# Patient Record
Sex: Male | Born: 1935 | Race: White | Hispanic: No | Marital: Single | Smoking: Current every day smoker
Health system: Southern US, Community
[De-identification: ages and names within clinical notes are randomized; demographics above are authoritative.]

## PROBLEM LIST (undated history)

## (undated) DIAGNOSIS — C801 Malignant (primary) neoplasm, unspecified: Secondary | ICD-10-CM

## (undated) DIAGNOSIS — K219 Gastro-esophageal reflux disease without esophagitis: Secondary | ICD-10-CM

## (undated) DIAGNOSIS — T1490XA Injury, unspecified, initial encounter: Secondary | ICD-10-CM

## (undated) DIAGNOSIS — Z5189 Encounter for other specified aftercare: Secondary | ICD-10-CM

## (undated) DIAGNOSIS — K8689 Other specified diseases of pancreas: Secondary | ICD-10-CM

## (undated) DIAGNOSIS — IMO0001 Reserved for inherently not codable concepts without codable children: Secondary | ICD-10-CM

## (undated) DIAGNOSIS — K7460 Unspecified cirrhosis of liver: Secondary | ICD-10-CM

## (undated) DIAGNOSIS — Z9221 Personal history of antineoplastic chemotherapy: Secondary | ICD-10-CM

## (undated) DIAGNOSIS — E119 Type 2 diabetes mellitus without complications: Principal | ICD-10-CM

## (undated) DIAGNOSIS — H269 Unspecified cataract: Secondary | ICD-10-CM

## (undated) DIAGNOSIS — I639 Cerebral infarction, unspecified: Secondary | ICD-10-CM

## (undated) DIAGNOSIS — F1020 Alcohol dependence, uncomplicated: Secondary | ICD-10-CM

## (undated) DIAGNOSIS — R0602 Shortness of breath: Secondary | ICD-10-CM

## (undated) DIAGNOSIS — R251 Tremor, unspecified: Secondary | ICD-10-CM

## (undated) DIAGNOSIS — N200 Calculus of kidney: Secondary | ICD-10-CM

## (undated) DIAGNOSIS — C3490 Malignant neoplasm of unspecified part of unspecified bronchus or lung: Secondary | ICD-10-CM

## (undated) HISTORY — DX: Type 2 diabetes mellitus without complications: E11.9

## (undated) HISTORY — PX: BACK SURGERY: SHX140

## (undated) HISTORY — DX: Unspecified cataract: H26.9

## (undated) HISTORY — PX: CARDIOVASCULAR STRESS TEST: SHX262

## (undated) HISTORY — PX: OTHER SURGICAL HISTORY: SHX169

## (undated) HISTORY — PX: CATARACT EXTRACTION: SUR2

## (undated) HISTORY — PX: AMPUTATION: SHX166

## (undated) HISTORY — DX: Other specified diseases of pancreas: K86.89

## (undated) HISTORY — PX: CHOLECYSTECTOMY: SHX55

---

## 1997-07-24 ENCOUNTER — Encounter: Admission: RE | Admit: 1997-07-24 | Discharge: 1997-07-24 | Payer: Self-pay | Admitting: Family Medicine

## 1997-08-07 ENCOUNTER — Encounter: Admission: RE | Admit: 1997-08-07 | Discharge: 1997-08-07 | Payer: Self-pay | Admitting: Family Medicine

## 1997-10-26 ENCOUNTER — Encounter: Admission: RE | Admit: 1997-10-26 | Discharge: 1997-10-26 | Payer: Self-pay | Admitting: Family Medicine

## 1997-11-27 ENCOUNTER — Encounter: Admission: RE | Admit: 1997-11-27 | Discharge: 1997-11-27 | Payer: Self-pay | Admitting: Family Medicine

## 1997-12-05 ENCOUNTER — Encounter: Admission: RE | Admit: 1997-12-05 | Discharge: 1997-12-05 | Payer: Self-pay | Admitting: Sports Medicine

## 1997-12-27 ENCOUNTER — Encounter: Admission: RE | Admit: 1997-12-27 | Discharge: 1997-12-27 | Payer: Self-pay | Admitting: Family Medicine

## 1998-02-01 ENCOUNTER — Encounter: Admission: RE | Admit: 1998-02-01 | Discharge: 1998-02-01 | Payer: Self-pay | Admitting: Family Medicine

## 1998-02-06 ENCOUNTER — Encounter
Admission: RE | Admit: 1998-02-06 | Discharge: 1998-05-07 | Payer: Self-pay | Admitting: Physical Medicine and Rehabilitation

## 1999-04-24 ENCOUNTER — Encounter: Admission: RE | Admit: 1999-04-24 | Discharge: 1999-04-24 | Payer: Self-pay | Admitting: Family Medicine

## 1999-05-08 ENCOUNTER — Encounter: Admission: RE | Admit: 1999-05-08 | Discharge: 1999-05-08 | Payer: Self-pay | Admitting: Family Medicine

## 1999-05-24 ENCOUNTER — Encounter: Admission: RE | Admit: 1999-05-24 | Discharge: 1999-05-24 | Payer: Self-pay | Admitting: Family Medicine

## 2000-06-10 ENCOUNTER — Encounter: Admission: RE | Admit: 2000-06-10 | Discharge: 2000-06-10 | Payer: Self-pay | Admitting: Family Medicine

## 2000-07-08 ENCOUNTER — Encounter: Admission: RE | Admit: 2000-07-08 | Discharge: 2000-07-08 | Payer: Self-pay | Admitting: Family Medicine

## 2000-08-07 ENCOUNTER — Encounter: Admission: RE | Admit: 2000-08-07 | Discharge: 2000-08-07 | Payer: Self-pay | Admitting: Family Medicine

## 2000-09-16 ENCOUNTER — Encounter: Admission: RE | Admit: 2000-09-16 | Discharge: 2000-09-16 | Payer: Self-pay | Admitting: Family Medicine

## 2000-10-23 ENCOUNTER — Encounter: Admission: RE | Admit: 2000-10-23 | Discharge: 2000-10-23 | Payer: Self-pay | Admitting: Family Medicine

## 2001-03-01 ENCOUNTER — Encounter: Admission: RE | Admit: 2001-03-01 | Discharge: 2001-03-01 | Payer: Self-pay | Admitting: Family Medicine

## 2001-04-26 ENCOUNTER — Encounter: Admission: RE | Admit: 2001-04-26 | Discharge: 2001-04-26 | Payer: Self-pay | Admitting: Sports Medicine

## 2001-05-17 ENCOUNTER — Encounter: Admission: RE | Admit: 2001-05-17 | Discharge: 2001-05-17 | Payer: Self-pay | Admitting: Family Medicine

## 2001-05-18 ENCOUNTER — Encounter: Payer: Self-pay | Admitting: Sports Medicine

## 2001-05-18 ENCOUNTER — Ambulatory Visit (HOSPITAL_COMMUNITY): Admission: RE | Admit: 2001-05-18 | Discharge: 2001-05-18 | Payer: Self-pay | Admitting: Sports Medicine

## 2001-05-25 ENCOUNTER — Encounter: Payer: Self-pay | Admitting: Anesthesiology

## 2001-05-25 ENCOUNTER — Encounter (INDEPENDENT_AMBULATORY_CARE_PROVIDER_SITE_OTHER): Payer: Self-pay | Admitting: Specialist

## 2001-05-25 ENCOUNTER — Inpatient Hospital Stay (HOSPITAL_COMMUNITY): Admission: RE | Admit: 2001-05-25 | Discharge: 2001-06-04 | Payer: Self-pay | Admitting: *Deleted

## 2001-11-26 ENCOUNTER — Encounter: Admission: RE | Admit: 2001-11-26 | Discharge: 2001-11-26 | Payer: Self-pay | Admitting: Family Medicine

## 2001-12-27 ENCOUNTER — Encounter: Admission: RE | Admit: 2001-12-27 | Discharge: 2001-12-27 | Payer: Self-pay | Admitting: Family Medicine

## 2002-07-01 ENCOUNTER — Encounter: Admission: RE | Admit: 2002-07-01 | Discharge: 2002-07-01 | Payer: Self-pay | Admitting: Family Medicine

## 2002-07-04 ENCOUNTER — Encounter: Admission: RE | Admit: 2002-07-04 | Discharge: 2002-07-04 | Payer: Self-pay | Admitting: Family Medicine

## 2002-10-05 ENCOUNTER — Emergency Department (HOSPITAL_COMMUNITY): Admission: EM | Admit: 2002-10-05 | Discharge: 2002-10-06 | Payer: Self-pay | Admitting: Emergency Medicine

## 2002-10-07 ENCOUNTER — Encounter: Admission: RE | Admit: 2002-10-07 | Discharge: 2002-10-07 | Payer: Self-pay | Admitting: Family Medicine

## 2002-10-28 ENCOUNTER — Encounter: Admission: RE | Admit: 2002-10-28 | Discharge: 2002-10-28 | Payer: Self-pay | Admitting: Sports Medicine

## 2002-11-17 ENCOUNTER — Encounter: Admission: RE | Admit: 2002-11-17 | Discharge: 2002-11-17 | Payer: Self-pay | Admitting: Family Medicine

## 2002-12-02 ENCOUNTER — Encounter: Admission: RE | Admit: 2002-12-02 | Discharge: 2002-12-02 | Payer: Self-pay | Admitting: Family Medicine

## 2003-02-08 ENCOUNTER — Encounter: Admission: RE | Admit: 2003-02-08 | Discharge: 2003-02-08 | Payer: Self-pay | Admitting: Family Medicine

## 2003-06-29 ENCOUNTER — Encounter: Admission: RE | Admit: 2003-06-29 | Discharge: 2003-06-29 | Payer: Self-pay | Admitting: Family Medicine

## 2003-07-10 ENCOUNTER — Encounter: Admission: RE | Admit: 2003-07-10 | Discharge: 2003-07-10 | Payer: Self-pay | Admitting: Family Medicine

## 2004-01-17 ENCOUNTER — Ambulatory Visit: Payer: Self-pay | Admitting: Family Medicine

## 2004-01-23 ENCOUNTER — Ambulatory Visit: Payer: Self-pay | Admitting: Family Medicine

## 2004-02-23 ENCOUNTER — Ambulatory Visit: Payer: Self-pay | Admitting: Sports Medicine

## 2004-02-23 ENCOUNTER — Ambulatory Visit (HOSPITAL_COMMUNITY): Admission: RE | Admit: 2004-02-23 | Discharge: 2004-02-23 | Payer: Self-pay | Admitting: Family Medicine

## 2004-07-15 ENCOUNTER — Ambulatory Visit: Payer: Self-pay | Admitting: Family Medicine

## 2005-02-18 ENCOUNTER — Ambulatory Visit: Payer: Self-pay | Admitting: Family Medicine

## 2006-01-20 ENCOUNTER — Ambulatory Visit: Payer: Self-pay | Admitting: Family Medicine

## 2006-03-11 ENCOUNTER — Ambulatory Visit (HOSPITAL_COMMUNITY): Admission: RE | Admit: 2006-03-11 | Discharge: 2006-03-11 | Payer: Self-pay | Admitting: Gastroenterology

## 2006-06-18 DIAGNOSIS — M545 Low back pain: Secondary | ICD-10-CM

## 2006-06-18 DIAGNOSIS — R569 Unspecified convulsions: Secondary | ICD-10-CM

## 2006-06-18 DIAGNOSIS — F1020 Alcohol dependence, uncomplicated: Secondary | ICD-10-CM | POA: Insufficient documentation

## 2006-06-18 DIAGNOSIS — F17200 Nicotine dependence, unspecified, uncomplicated: Secondary | ICD-10-CM

## 2006-06-18 DIAGNOSIS — I679 Cerebrovascular disease, unspecified: Secondary | ICD-10-CM

## 2007-04-05 ENCOUNTER — Ambulatory Visit: Payer: Self-pay | Admitting: Family Medicine

## 2007-04-05 ENCOUNTER — Encounter: Payer: Self-pay | Admitting: Family Medicine

## 2007-04-05 DIAGNOSIS — R030 Elevated blood-pressure reading, without diagnosis of hypertension: Secondary | ICD-10-CM

## 2007-04-05 DIAGNOSIS — R05 Cough: Secondary | ICD-10-CM

## 2007-04-05 LAB — CONVERTED CEMR LAB
ALT: 16 units/L (ref 0–53)
Alkaline Phosphatase: 77 units/L (ref 39–117)
BUN: 16 mg/dL (ref 6–23)
CO2: 26 meq/L (ref 19–32)
Calcium: 9.9 mg/dL (ref 8.4–10.5)
Creatinine, Ser: 1.09 mg/dL (ref 0.40–1.50)
Direct LDL: 98 mg/dL
Potassium: 4.1 meq/L (ref 3.5–5.3)
Sodium: 141 meq/L (ref 135–145)
Total Protein: 7 g/dL (ref 6.0–8.3)

## 2007-04-06 ENCOUNTER — Ambulatory Visit: Payer: Self-pay | Admitting: Sports Medicine

## 2007-04-08 ENCOUNTER — Encounter: Admission: RE | Admit: 2007-04-08 | Discharge: 2007-04-08 | Payer: Self-pay | Admitting: Family Medicine

## 2007-04-08 ENCOUNTER — Encounter: Payer: Self-pay | Admitting: Family Medicine

## 2007-04-19 ENCOUNTER — Ambulatory Visit: Payer: Self-pay | Admitting: Sports Medicine

## 2007-10-04 ENCOUNTER — Encounter: Payer: Self-pay | Admitting: Family Medicine

## 2007-10-04 ENCOUNTER — Ambulatory Visit: Payer: Self-pay | Admitting: Family Medicine

## 2007-10-04 ENCOUNTER — Encounter: Admission: RE | Admit: 2007-10-04 | Discharge: 2007-10-04 | Payer: Self-pay | Admitting: Family Medicine

## 2007-10-04 DIAGNOSIS — M25559 Pain in unspecified hip: Secondary | ICD-10-CM

## 2007-10-04 LAB — CONVERTED CEMR LAB
BUN: 15 mg/dL (ref 6–23)
Creatinine, Ser: 0.89 mg/dL (ref 0.40–1.50)
Potassium: 4.5 meq/L (ref 3.5–5.3)
Total Bilirubin: 0.5 mg/dL (ref 0.3–1.2)

## 2008-05-29 ENCOUNTER — Telehealth: Payer: Self-pay | Admitting: *Deleted

## 2008-05-30 ENCOUNTER — Ambulatory Visit: Payer: Self-pay | Admitting: Family Medicine

## 2008-05-30 ENCOUNTER — Encounter: Payer: Self-pay | Admitting: Family Medicine

## 2008-05-30 ENCOUNTER — Encounter: Admission: RE | Admit: 2008-05-30 | Discharge: 2008-05-30 | Payer: Self-pay | Admitting: Family Medicine

## 2008-05-30 DIAGNOSIS — R1013 Epigastric pain: Secondary | ICD-10-CM

## 2008-05-30 LAB — CONVERTED CEMR LAB
AST: 17 units/L (ref 0–37)
Albumin: 4.3 g/dL (ref 3.5–5.2)
Alkaline Phosphatase: 80 units/L (ref 39–117)
BUN: 16 mg/dL (ref 6–23)
Calcium: 9.8 mg/dL (ref 8.4–10.5)
Creatinine, Ser: 1.01 mg/dL (ref 0.40–1.50)
Lipase: 31 units/L (ref 0–75)
Sodium: 139 meq/L (ref 135–145)
Total Bilirubin: 0.4 mg/dL (ref 0.3–1.2)
Total Protein: 7.2 g/dL (ref 6.0–8.3)

## 2008-07-25 ENCOUNTER — Ambulatory Visit: Payer: Self-pay | Admitting: Family Medicine

## 2008-07-26 ENCOUNTER — Encounter: Payer: Self-pay | Admitting: Family Medicine

## 2008-07-26 LAB — CONVERTED CEMR LAB
HDL: 37 mg/dL — ABNORMAL LOW (ref >39)
VLDL: 19 mg/dL (ref 0–40)

## 2009-03-14 ENCOUNTER — Ambulatory Visit: Payer: Self-pay | Admitting: Family Medicine

## 2009-03-14 DIAGNOSIS — G252 Other specified forms of tremor: Secondary | ICD-10-CM | POA: Insufficient documentation

## 2009-03-14 DIAGNOSIS — G250 Essential tremor: Secondary | ICD-10-CM

## 2009-03-14 DIAGNOSIS — R070 Pain in throat: Secondary | ICD-10-CM

## 2009-03-27 ENCOUNTER — Ambulatory Visit: Payer: Self-pay | Admitting: Family Medicine

## 2009-04-30 ENCOUNTER — Ambulatory Visit: Payer: Self-pay | Admitting: Family Medicine

## 2009-04-30 ENCOUNTER — Encounter: Payer: Self-pay | Admitting: Family Medicine

## 2009-05-03 LAB — CONVERTED CEMR LAB
CO2: 27 meq/L (ref 19–32)
Chloride: 102 meq/L (ref 96–112)
Creatinine, Ser: 0.99 mg/dL (ref 0.40–1.50)
Glucose, Bld: 89 mg/dL (ref 70–99)

## 2009-05-07 ENCOUNTER — Ambulatory Visit (HOSPITAL_COMMUNITY): Admission: RE | Admit: 2009-05-07 | Discharge: 2009-05-07 | Payer: Self-pay | Admitting: Family Medicine

## 2009-05-14 ENCOUNTER — Encounter: Payer: Self-pay | Admitting: Family Medicine

## 2009-06-01 ENCOUNTER — Encounter: Payer: Self-pay | Admitting: Family Medicine

## 2009-06-01 ENCOUNTER — Ambulatory Visit: Payer: Self-pay | Admitting: Family Medicine

## 2009-06-01 LAB — CONVERTED CEMR LAB
Albumin: 4.1 g/dL (ref 3.5–5.2)
Alkaline Phosphatase: 77 units/L (ref 39–117)
BUN: 18 mg/dL (ref 6–23)
Chloride: 103 meq/L (ref 96–112)
Creatinine, Ser: 0.92 mg/dL (ref 0.40–1.50)
Glucose, Bld: 111 mg/dL — ABNORMAL HIGH (ref 70–99)
Potassium: 4.2 meq/L (ref 3.5–5.3)
Sodium: 139 meq/L (ref 135–145)

## 2009-12-18 ENCOUNTER — Telehealth: Payer: Self-pay | Admitting: Family Medicine

## 2010-03-29 ENCOUNTER — Ambulatory Visit: Payer: Self-pay | Admitting: Family Medicine

## 2010-03-29 ENCOUNTER — Encounter: Payer: Self-pay | Admitting: Family Medicine

## 2010-03-29 DIAGNOSIS — R5381 Other malaise: Secondary | ICD-10-CM

## 2010-03-29 DIAGNOSIS — R071 Chest pain on breathing: Secondary | ICD-10-CM

## 2010-03-29 DIAGNOSIS — R5383 Other fatigue: Secondary | ICD-10-CM

## 2010-03-29 LAB — CONVERTED CEMR LAB
HCT: 46.3 % (ref 39.0–52.0)
Hemoglobin: 16.5 g/dL (ref 13.0–17.0)
MCHC: 35.6 g/dL (ref 30.0–36.0)
Platelets: 237 10*3/uL (ref 150–400)
RDW: 14.7 % (ref 11.5–15.5)
TSH: 1.659 microintl units/mL (ref 0.350–4.500)
WBC: 8 10*3/uL (ref 4.0–10.5)

## 2010-03-30 ENCOUNTER — Encounter: Payer: Self-pay | Admitting: Family Medicine

## 2010-05-15 ENCOUNTER — Encounter (INDEPENDENT_AMBULATORY_CARE_PROVIDER_SITE_OTHER): Payer: Self-pay | Admitting: *Deleted

## 2010-05-21 NOTE — Assessment & Plan Note (Signed)
Summary: stomach pains,tcb   Vital Signs:  Patient profile:   77 year old male Height:      68.5 inches Weight:      178 pounds BMI:     26.77 BSA:     1.96 Temp:     98.1 degrees F Pulse rate:   97 / minute BP sitting:   137 / 79  Vitals Entered By: Jone Baseman CMA (June 01, 2009 2:39 PM) CC: stomach pain x 2 months Is Patient Diabetic? No Pain Assessment Patient in pain? yes     Location: stomach Intensity: 9   Primary Care Provider:  [name] MD  CC:  stomach pain x 2 months.  History of Present Illness: pt presents with a burning pain acros the right side of his abd for 3-4 years, ever since his cholecystectomy.  He reports it had gotten worse wince he increased his PPI.  He has since stopped his PPi 2 weeks ago, without relief.  This pain has been worked up before.  It was blamed on his GERD in the past.  He denies burping, n/v/d/c, fevers, burning in his throat, and has changed his diet for his GERD including switching to decaf coffee.  sneezing and coughing give him a sharp pain that is differen from his usual burnign, which is constant.  had colonoscopy 2 years ago which was clear.    Habits & Providers  Alcohol-Tobacco-Diet     Tobacco Status: current     Tobacco Counseling: to quit use of tobacco products     Cigarette Packs/Day: 0.5  Current Medications (verified): 1)  Omeprazole 20 Mg Tbec (Omeprazole) .Marland Kitchen.. 1 Tab By Mouth Bid 2)  Neurontin 300 Mg Caps (Gabapentin) .... Take One On The First Day, Take Two Times A Day On The Next Day, and Three Times A Day On The Third Day.  Continue Three Times A Day.  Allergies (verified): No Known Drug Allergies  Social History: Packs/Day:  0.5  Review of Systems       The patient complains of abdominal pain and severe indigestion/heartburn.  The patient denies anorexia, fever, weight loss, hoarseness, chest pain, hemoptysis, and melena.    Physical Exam  General:  Well-developed,well-nourished,in no  acute distress; alert,appropriate and cooperative throughout examination Abdomen:  soft, non-tender, normal bowel sounds, no distention, no masses, no guarding, and no rebound tenderness.  pain is right sided, from umbilicus to edge of ribs and then over to mid clavicular line.  report that it feels better with pressure.     Impression & Recommendations:  Problem # 1:  ABDOMINAL PAIN, EPIGASTRIC (ICD-789.06) Assessment Deteriorated pt has been seen for this or similar complaint before.  he is concerned that something was left after surgery but had an abd xray, which i reviewed today, which was negative for that.  I think that his description sounds like nerve pain, he denies zoster but may be the beginning of a shingles breakout.  More likely, could be neuralgia due to his surgical incision.  will try neurontin for this pain.  asked if pt would like lidocaine patch, but he will try neurontin first.  asked if pt would like to go back to surgeon, but he would like meds first.  may also have a small hernia component, gave red flags. Orders: Comp Met-[facility] ([id number])  Complete Medication List: 1)  Omeprazole 20 Mg Tbec (Omeprazole) .Marland Kitchen.. 1 tab by mouth bid 2)  Neurontin 300 Mg Caps (Gabapentin) .... Take one on the  first day, take two times a day on the next day, and three times a day on the third day.  continue three times a day.  Patient Instructions: 1)  Please schedule a follow-up appointment in 1 month.  2)  Please try the neurontin.  Take one the first day, then two times a day, then three times a day.  Call if this is not helping after 2 weeks.  It may take a while to build up. 3)  If you have fevers, blood in your stool, call the office. Prescriptions: NEURONTIN 300 MG CAPS (GABAPENTIN) take one on the first day, take two times a day on the next day, and three times a day on the third day.  continue three times a day.  #90 x 3   Entered and Authorized by:   [name] MD   Signed by:    [name] MD on 06/01/2009   Method used:   Electronically to        [facility] [address] [phone]* (retail)       [address] [place], [place]  [address]       Ph: [phone]       Fax: [phone]   RxID:   [id number]   Appended Document: stomach pains,tcb for billing   Clinical Lists Changes  Orders: Added new Test order of [facility]- Est Level  3 ([address]) - Signed

## 2010-05-21 NOTE — Assessment & Plan Note (Signed)
Summary: FU/KH   Vital Signs:  Patient profile:   77 year old male Height:      68.5 inches Weight:      177 pounds BMI:     26.62 BSA:     1.95 Temp:     98.3 degrees F Pulse rate:   101 / minute BP sitting:   152 / 82  Vitals Entered By: Jone Baseman CMA (April 30, 2009 10:56 AM) CC: f/u throat Is Patient Diabetic? No Pain Assessment Patient in pain? no        Primary Care Provider:  [name] MD  CC:  f/u throat.  History of Present Illness: Pt presents for f/u of throat pain.  He reports that his soreness and fullness are better but not gone.  His voice is however, deeper and is cracking again. He denies weight loss, night sweats or other B symptoms.  He denies trouble swallowing liiquids or solids though when he takes his nexium he feels that his throat closes and his pill sticks in his throat. Pt smoking 1ppd and does not want to quit. Last HPI: sore throat- pt here for f/u of throat soreness and fullness. reports that he has changed to decaf coffee and is taking omeprazole.  He feels that the soreness has improved though it is not completely gone.  His voice is also less deep and cracks less.  He still feels that his throat feels full on the left side however.   Habits & Providers  Alcohol-Tobacco-Diet     Tobacco Status: current     Tobacco Counseling: to quit use of tobacco products     Cigarette Packs/Day: 1.0  Current Medications (verified): 1)  Omeprazole 20 Mg Tbec (Omeprazole) .Marland Kitchen.. 1 Tab By Mouth Daily  Allergies (verified): No Known Drug Allergies  Review of Systems       The patient complains of hoarseness.  The patient denies anorexia, fever, weight loss, chest pain, prolonged cough, hemoptysis, and severe indigestion/heartburn.    Physical Exam  General:  Well-developed,well-nourished,in no acute distress; alert,appropriate and cooperative throughout examination Head:  normocephalic and atraumatic.   Neck:  No deformities, masses, or  tenderness noted.supple and full ROM.     Impression & Recommendations:  Problem # 1:  THROAT PAIN (ICD-784.1) Assessment Deteriorated Plan is to get BMET today.  If Cr is fine, will get CT head and neck with contrast.  Will send over to ENT as soon as we have that scan.  Asked pt to f/u with me after ENT appt.  Gave smoking cessation info and discussed with pt. Orders: ENT Referral (ENT) Basic Met-[facility] [id number]) CT with Contrast (CT w/ contrast) [facility]- Est Level  3 ([address])  Complete Medication List: 1)  Omeprazole 20 Mg Tbec (Omeprazole) .Marland Kitchen.. 1 tab by mouth daily  Patient Instructions: 1)  Tobacco is very bad for your health and your loved ones ! You should stop smoking !  I am giving you information on the class here at the clinic for smoking cessation. 2)  I will get a blood test today to look at your kidney function.  Once I have those results, we will schedule a CT scan of your throat.  Also, we will send over a referral for an ENT appt.  They will want the results of your scan, so it will ba after that. 3)  please follow up with me after your ENT appt.

## 2010-05-21 NOTE — Progress Notes (Signed)
Summary: refill  Phone Note Refill Request Call back at Home Phone [phone] Message from:  Patient  Refills Requested: Medication #1:  NEURONTIN 300 MG CAPS take one on the first day Initial call taken by: De Nurse,  December 18, 2009 2:05 PM    New/Updated Medications: NEURONTIN 300 MG CAPS (GABAPENTIN) take three times a day PO Prescriptions: NEURONTIN 300 MG CAPS (GABAPENTIN) take three times a day PO  #90 x 6   Entered and Authorized by:   [name] MD   Signed by:   [name] MD on 12/18/2009   Method used:   Electronically to        [facility] [address] [phone]* (retail)       [address] [place], [place]  [address]       Ph: [phone]       Fax: [phone]   RxID:   [id number]

## 2010-05-21 NOTE — Consult Note (Signed)
Summary: [facility]  [facility]   Imported By: Clydell Hakim 05/16/2009 14:19:43  _____________________________________________________________________  External Attachment:    Type:   Image     Comment:   External Document  Appended Document: [facility] no evidence of malignancy, increase ppi to BId and see back in 2 months  Appended Document: [facility]    Clinical Lists Changes  Medications: Changed medication from OMEPRAZOLE 20 MG TBEC (OMEPRAZOLE) 1 tab by mouth daily to OMEPRAZOLE 20 MG TBEC (OMEPRAZOLE) 1 tab by mouth BID

## 2010-05-23 NOTE — Assessment & Plan Note (Signed)
Summary: f/u,df   Vital Signs:  Patient profile:   77 year old male Height:      68.5 inches Weight:      175.5 pounds BMI:     26.39 Temp:     98.6 degrees F oral Pulse rate:   93 / minute BP sitting:   147 / 81  (left arm) Cuff size:   regular  Vitals Entered By: Garen Grams LPN (March 29, 2010 2:47 PM) CC: f/u fatigue, GERD, chest wall pain Is Patient Diabetic? No Pain Assessment Patient in pain? no        Primary Care Provider:  [name] MD  CC:  f/u fatigue, GERD, and chest wall pain.  History of Present Illness: fatigue- worse over the last several months.  nothing specific, just doesn't feel as motivated.  makes a point to stay social.  no thoughts of hurting self or others.  not worried that he is withdrawing.    chest wall pain- after surgery.  neurontin helps a lot.  if he takes two times a day it is gone.  GERD- gone, stopped omeprazole.  voice back to normal.    still a smoker, not interested in quitting  Habits & Providers  Alcohol-Tobacco-Diet     Tobacco Status: current     Tobacco Counseling: to quit use of tobacco products     Cigarette Packs/Day: 0.5     Year Started: 1950     Pack years: 51  Current Medications (verified): 1)  Neurontin 300 Mg Caps (Gabapentin) .... Take Three Times A Day Po  Allergies (verified): No Known Drug Allergies  Review of Systems  The patient denies anorexia, fever, weight loss, hoarseness, and chest pain.    Physical Exam  General:  Well-developed,well-nourished,in no acute distress; alert,appropriate and cooperative throughout examination Lungs:  Normal respiratory effort, chest expands symmetrically. Lungs are clear to auscultation, no crackles or wheezes. Heart:  Normal rate and regular rhythm. S1 and S2 normal without gallop, murmur, click, rub or other extra sounds. Abdomen:  Bowel sounds positive,abdomen soft and non-tender without masses, organomegaly or hernias noted.   Impression &  Recommendations:  Problem # 1:  MALAISE AND FATIGUE (ICD-780.79) Assessment New i will check CBC, TSH for this.  asked pt to return if symptoms worsen.  likely selflimited.  pt has good social network.  agrees to RTC if worse Orders: CBC-[facility] ([address]) TSH-[facility] ([address]) [facility]- Est  Level 4 ([address])  Problem # 2:  CHEST WALL PAIN, ANTERIOR (ICD-786.52) Assessment: Improved better when he takes neurontin. Orders: [facility]- Est  Level 4 ([address])  Problem # 3:  THROAT PAIN (ICD-784.1) Assessment: Improved resolved.  stopped omeprazole. Orders: [facility]- Est  Level 4 ([address])  Complete Medication List: 1)  Neurontin 300 Mg Caps (Gabapentin) .... Take three times a day po Prescriptions: NEURONTIN 300 MG CAPS (GABAPENTIN) take three times a day PO  #90 x 6   Entered and Authorized by:   [name] MD   Signed by:   [name] MD on 03/29/2010   Method used:   Electronically to        [facility]. [phone]* (retail)       [address]       [place], [place]  [address]       Ph: [phone]       Fax: [phone]   RxID:   [id number]    Orders Added: 1)  CBC-[facility] [[address]] 2)  TSH-[facility] [[address]] 3)  [facility]- Est  Level 4 [[address]]

## 2010-05-23 NOTE — Letter (Signed)
Summary: Generic Letter  [facility]  [address]   [place], [place] [address]   Phone: [phone]  Fax: [phone]    03/30/2010  ESIQUIO BOESEN [address] [place], [place]  [address]  Dear Mr. Capshaw,    I wanted to let you know that your lab work looks just fine.  Please let me know if your symptoms get worse or if we can be of help.       Sincerely,   [name] MD  Appended Document: Generic Letter mailed

## 2010-05-23 NOTE — Letter (Signed)
Summary: Generic Letter  [name]  [address]   [place], [place] [address]   Phone: [phone]  Fax: [phone]    05/15/2010  [address] [place], [place]  [address]  Dear Mr. Austin [name],  We are happy to let you know that since you are covered under Medicare you are able to have a FREE visit at the [facility] to discuss your HEALTH. This is a new benefit for Medicare.  There will be no co-payment.  At this visit you will meet with [name] an expert in wellness and the health coach at our clinic.  At this visit we will discuss ways to keep you healthy and feeling well.  This visit will not replace your regular doctor visit and we cannot refill medications.     You will need to plan to be here at least one hour to talk about your medical history, your current status, review all of your medications, and discuss your future plans for your health.  This information will be entered into your record for your doctor to have and review.  If you are interested in staying healthy, this type of visit can help.  Please call the office at: [phone], to schedule a "Medicare Wellness Visit".  The day of the visit you should bring in all of your medications, including any vitamins, herbs, over the counter products you take.  Make a list of all the other doctors that you see, so we know who they are. If you have any other health documents please bring them.  We look forward to helping you stay healthy.  Sincerely,   [name]  iAWV

## 2010-06-03 ENCOUNTER — Encounter: Payer: Self-pay | Admitting: *Deleted

## 2010-09-06 NOTE — Discharge Summary (Signed)
[facility]  Patient:    Austin [name], Austin [name] Visit Number: [id number] MRN: [id number]          Service Type: SUR Location: 4W 0450 01 Attending Physician:  Vikki Ports Dictated by:   Vikki Ports, M.D. Admit Date:  05/25/2001 Discharge Date: 06/04/2001                             Discharge Summary  ADMITTING DIAGNOSIS:  Acute cholecystitis.  DISCHARGE DIAGNOSIS:  Acute cholecystitis with postoperative hemorrhage.  CONDITION ON DISCHARGE:  Good.  Discharged to home.  Follow-up is with me one week after discharge.  HISTORY OF PRESENT ILLNESS:  The patient is a 77 year old white male who over the prior five to six days has been having worsening epigastric and right upper quadrant abdominal pain.  He was evaluated at [facility] with where they ordered an ultrasound in the right upper quadrant which showed multiple gallstones and thickening of the gallbladder wall.  The common bile duct was normal.  For the remainder of the H&P, please see the chart.  HOSPITAL COURSE:  Patient is admitted, taken to the operating room where he underwent a laparoscopic cholecystectomy.  Patient was noted to have marked cirrhosis of his liver.  Postoperatively, patient was monitored in the intensive care unit and transferred to the floor.  On transferring him to the floor, he was found to be hypotensive to a systolic blood pressure of between 70 and 90.  He returned to the PACU.  Hemoglobin had dropped to 8 g from 13.7 preoperatively.  Patient continued to have intermittent hypotension in the recovery room and, therefore, was taken emergently back to the operating room for exploration.  Abdomen was explored.  Patient was found to have massive hemoperitoneum with a significant oozing from the liver bed as well as a small arterial bleeder near the duodenum.  All of this was controlled with a suture ligature and Bovie electrocautery.  It was also  noted that the area which had a lot of inflammation in the duodenum appeared to have a small serosal and possibly through-in-through tear.  On further examination, there was some bilious drainage from the lateral wall of the duodenum and the opening measured about 1 to 2 mm.  The wall was quite thin around it, but it was able to be reapproximated using interrupted 3-0 silk ligatures.  Postoperatively, patient was watched in the intensive care unit, extubated on postoperative day #1.  Patient then had a prolonged hospitalization with a prolonged ileus.  He did require transfusion of FFP intra and postoperatively.  He also required multiple units of packed red blood cells during the operation.  He remained hemodynamically stable after being supported with dopamine on the night of surgery.  The following day, after extubation, he was monitored in the intensive care unit, was having some expiratory wheezing, some mild hypoxia. This was treated with a flutter valve.  Intermittent hypokalemia was treated with potassium chloride.  On postoperative day #4, NG tube volume was decreased and NG tube was removed.  It appeared on his follow-up chest x-ray on postoperative day #6 while still requiring some supplemental oxygen that he may have a left pleural effusion.  Repeat chest x-ray showed this was more likely to be consistent with atelectasis and no thoracentesis was performed. On hospital day #10, patient was noted to have a small lateral incisional wound infection which was opened.  Fascia  was intact.  Dressing changes were implemented.  On postoperative day #10, patient was feeling better, stronger, and was asking to go home.  He was discharged to home on Vicodin for pain and Augmentin 875 mg p.o. b.i.d. and follow-up is with me in one week. Dictated by:   Vikki Ports, M.D. Attending Physician:  Danna Hefty R. DD:  06/29/01 TD:  06/30/01 Job: [id number]

## 2010-09-06 NOTE — Op Note (Signed)
[name], [name]                ACCOUNT NO.:  [id number]   MEDICAL RECORD NO.:  [id number]          PATIENT TYPE:  AMB   LOCATION:  ENDO                         FACILITY:  [facility]   PHYSICIAN:  Anselmo Rod, M.D.  DATE OF BIRTH:  [birth year]   DATE OF PROCEDURE:  03/11/2006  DATE OF DISCHARGE:                                 OPERATIVE REPORT   PROCEDURE PERFORMED:  Screening colonoscopy.   ENDOSCOPIST:  Anselmo Rod, M.D.   INSTRUMENT USED:  Olympus video colonoscope.   INDICATION FOR PROCEDURE:  A 77 year old white male undergoing screening  colonoscopy to rule out colonic polyps, masses, etc.   PREPROCEDURE PREPARATION:  Informed consent was procured from the patient.  The patient was fasted for eight hours prior to the procedure and prepped  with a gallon of TriLyte the night prior to the procedure.  The risks and  benefits of the procedure, including a 10% miss rate of cancer and polyps,  were discussed with the patient as well.   PREPROCEDURE PHYSICAL:  VITAL SIGNS:  The patient had stable vital signs.  NECK:  Supple.  CHEST:  Clear to auscultation.  S1, S2 regular.  ABDOMEN:  Soft with normal bowel sounds.   DESCRIPTION OF PROCEDURE:  The patient was placed in the left lateral  decubitus position and sedated with 90 mcg of fentanyl and 7.5 mg of Versed  in slow incremental doses.  Once the patient was adequately sedate and  maintained on low-flow oxygen and continuous cardiac monitoring, the Olympus  video colonoscope was advanced from the rectum to the cecum.  There was some  residual stool in dependent areas of the colon.  Multiple washes were done.  The appendiceal orifice and ileocecal valve were clearly visualized and  photographed.  No masses, polyps, erosions, ulcerations or diverticula were  seen.  Small internal hemorrhoids were appreciated on retroflexion in the  rectum.  The patient tolerated the procedure well without immediate  complications.   IMPRESSION:  1. Normal colonoscopy up to the cecum.  No masses, polyps, erosions,      ulcerations or diverticula seen.  2. Small nonbleeding internal hemorrhoids seen on retroflexion.   RECOMMENDATIONS:  1. Continue a high-fiber diet with liberal fluid intake.  2. Repeat colonoscopy in the next 10 years unless the patient develops any      abnormal symptoms in the interim, in which case he should contact the      office immediately for further recommendations.  3. Outpatient follow-up as the need arises in the future.      Anselmo Rod, M.D.  Electronically Signed     JNM/MEDQ  D:  03/11/2006  T:  03/11/2006  Job:  [id number]   cc:   Janetta Hora. Darrick Penna, MD

## 2010-09-06 NOTE — Op Note (Signed)
[facility]  Patient:    Austin [name], Austin [name] Visit Number: [id number] MRN: [id number]          Service Type: MED Location: 2S [id number] Attending Physician:  Vikki Ports. Dictated by:   Earna Coder, M.D. Proc. Date: 05/25/01 Admit Date:  05/25/2001                             Operative Report  PREOPERATIVE DIAGNOSIS:  Hemoperitoneum following laparoscopic cholecystectomy.  POSTOPERATIVE DIAGNOSIS:  Hemoperitoneum following laparoscopic cholecystectomy.  Incidental duodenotomy.  OPERATION PERFORMED:  Exploratory laparotomy with evacuation of intra-abdominal hemoperitoneum.  Coagulation of gallbladder bed and repair of duodenotomy.  SURGEON:  Earna Coder, M.D.  ASSISTANT:  Gita Kudo, M.D.  ANESTHESIA:  General.  DESCRIPTION OF PROCEDURE:  The patient was taken emergently to the operating room, placed in the supine position and general anesthesia was induced.  The abdomen was prepped and draped and a right upper quadrant subcostal incision was made.  Dissected down to the fascia. This was opened.  Muscle fibers were divided and peritoneum was entered.  On entering the peritoneum a very large amount of clot was evacuated in Morrisons pouch, in the pelvis, in the left upper quadrant and above the liver.  On inspection of the gallbladder bed, noting again the severe nodular cirrhosis of the liver, an area of oozing gallbladder bed was identified in the most posterior aspect.  This was coagulated and then treated with Surgicel and thrombin-soaked Gelfoam.  This was packed.  During retraction of the duodenum to the left area that had previously been inflamed and adjacent to the gallbladder on the first operation was noted to have a small bile leak.  A duodenotomy was noted to be about 1 mm in diameter in the lateral wall of the duodenum.  This was closed with interrupted 3-0 silk ligatures.  The nasogastric tube which  was in the stomach was then irrigated with 300 cc of methylene blue liquid and no evidence of leak was seen.  A small arterial bleeder was also seen near the duodenum and this was oversewn.  The abdomen was copiously irrigated with numerous liters of saline solution.  Again, the gallbladder bed was inspected. Two Blake drains were placed through the previously made right abdominal wall incisions and placed one near the duodenum and one in the gallbladder bed.  I felt satisfied with hemostasis at this point.  The nasogastric tube was in good placement.  The fascia was closed in two layers using running #1 Novofil. The skin was closed with staples.  The patient tolerated the procedure well and went to the intensive care unit in critical condition.  Estimated blood loss was about 800 cc.  Replacement was four units of packed cells. Dictated by:   Earna Coder, M.D. Attending Physician:  Danna Hefty R. DD:  05/27/01 TD:  05/27/01 Job: [id number]

## 2010-09-06 NOTE — Op Note (Signed)
[facility]  Patient:    Austin [name], Austin [name] Visit Number: [id number] MRN: [id number]          Service Type: DSU Location: DAY Attending Physician:  Vikki Ports Dictated by:   Earna Coder, M.D. Proc. Date: 05/25/01 Admit Date:  05/25/2001                             Operative Report  PREOPERATIVE DIAGNOSIS:  Acute cholecystitis with elevated liver function tests.  POSTOPERATIVE DIAGNOSIS:  Acute cholecystitis with elevated liver function tests.  OPERATION PERFORMED:  Laparoscopic cholecystectomy with intraoperative cholangiogram.  SURGEON:  Stephenie Acres, M.D.  ASSISTANT:  Donnie Coffin. Samuella Cota, M.D.  ANESTHESIA:  General.  RESULTS OF INTRAOPERATIVE CHOLANGIOGRAM:  Normal.  DESCRIPTION OF PROCEDURE:  The patient was taken to the operating room and placed in supine position.  After adequate anesthesia was induced using endotracheal tube, the abdomen was prepped and draped in normal sterile fashion.  I did a transverse infraumbilical incision and dissected down to the fascia.  The fascia was opened vertically.  A 0 Vicryl pursestring suture was placed around the fascial defect.  The Hasson trocar was placed in the abdomen.  The abdomen was insufflated to 15 mHg with continuous flow carbon dioxide.  Under direct visualization, a 10 mm port was placed in the subxiphoid region and two 5 mm ports were placed in the right abdomen.  The gallbladder was identified and was very tense and edematous.  It was aspirated with a cyst aspirator.  The surface of the liver was significantly involved, appeared to be nodular cirrhosis.  The infundibulum of the gallbladder was grasped.  A number of dense adhesions were taken down.  The cystic duct was dissected free of all surrounding structures.  A window was created behind it in Calots triangle where the cystic artery was also found, identified, clipped and divided.  The cystic duct was then  clipped proximally and a small ductotomy was made.  Reddick catheter was passed through a 14 gauge angiocath and then passed down through the distal cystic duct.  Cholangiogram was performed which showed free flow into the duodenum, no filling defects and good filling of both the right and left hepatic ducts.  The common duct diameter appeared normal.  The Reddick catheter was removed and the cystic duct was then triply clipped.  A number of small arterial tributaries were identified, triply clipped and divided.  The gallbladder was taken off the gallbladder bed using Bovie electrocautery.  There was significant bleeding of the liver bed.  This was coagulated.  Adequate hemostasis was ensured.  Some Surgicel was placed in the gallbladder bed.  The infra-umbilical fascial defect was closed with the 0 Vicryl pursestring sutures.  All skin incisions were closed with staples.  All incisions were injected using 0.5% Marcaine.  The patient tolerated the procedure well and went to PACU in good condition. Dictated by:   Earna Coder, M.D. Attending Physician:  Danna Hefty R. DD:  05/25/01 TD:  05/25/01 Job: [id number]

## 2010-11-06 ENCOUNTER — Ambulatory Visit (INDEPENDENT_AMBULATORY_CARE_PROVIDER_SITE_OTHER): Payer: Medicare Other | Admitting: Family Medicine

## 2010-11-06 ENCOUNTER — Encounter: Payer: Self-pay | Admitting: Family Medicine

## 2010-11-06 DIAGNOSIS — R040 Epistaxis: Secondary | ICD-10-CM

## 2010-11-06 NOTE — Patient Instructions (Signed)
Unfortunately nasal bleeding can come and go and will keep bleeding until whatever vessel finally heals  I would try some nasal saline gel or spray to keep the area moist  The smoking probable doesn't help--It may irritate it  I am giving you a QR pack to stop nosebleeds.  Read the directions.  I think these may also be over the counter  If this just wont stop, come back and we may consider sending you back to the ear nose and throat doctor

## 2010-11-12 ENCOUNTER — Ambulatory Visit (INDEPENDENT_AMBULATORY_CARE_PROVIDER_SITE_OTHER): Payer: Medicare Other | Admitting: Family Medicine

## 2010-11-12 ENCOUNTER — Encounter: Payer: Self-pay | Admitting: Family Medicine

## 2010-11-12 VITALS — BP 134/70 | HR 89 | Temp 98.1°F | Ht 68.5 in | Wt 173.1 lb

## 2010-11-12 DIAGNOSIS — R040 Epistaxis: Secondary | ICD-10-CM

## 2010-11-12 NOTE — Assessment & Plan Note (Signed)
Will try nasal saline gel and advised pressure with nosebleeds.  Reassurance that may reoccur.  If persistent, would consider ENT evaluation.

## 2010-11-12 NOTE — Patient Instructions (Signed)
I am sorry you are still having these.  Lets send you to the expert!  Come back and see me if I can help with anything after that appt.  I hope this gets fixed quickly

## 2010-11-12 NOTE — Progress Notes (Signed)
  Subjective:    Patient ID: Austin [name], male    DOB: Mar 26, 1934, 77 y.o.   MRN: [id number]  HPI  Pt presents with 3 days of nosebleeds.  These come 3x/day, they last <62min before stopping.  Yesterday AM woke up with nosebleed that was choking him.  No preceding illness, no seasonal allergies.  Still smoking 1 ppd.  No alcohol use   Review of Systems Denies CP, SOB, HA, N/V/D, fever     Objective:   Physical Exam    Vital signs reviewed General appearance - alert, well appearing, and in no distress and oriented to person, place, and time Eyes - pupils equal and reactive, extraocular eye movements intact, sclera anicteric Ears - bilateral TM's and external ear canals normal, right ear normal, left ear normal Nose - normal and patent, no  Polyps, left nostril with blood crusting around opening Heart - normal rate, regular rhythm, normal S1, S2, no murmurs, rubs, clicks or gallops Chest - clear to auscultation, no wheezes, rales or rhonchi, symmetric air entry, no tachypnea, retractions or cyanosis      Assessment & Plan:

## 2010-11-13 ENCOUNTER — Encounter: Payer: Self-pay | Admitting: Family Medicine

## 2010-11-13 NOTE — Assessment & Plan Note (Signed)
Will refer to ENT for eval since pt is impatient to have this resolved  Pt saw ENT that day- letter back 7/25-unable to find source of bleeding, continue current symptom management

## 2010-11-13 NOTE — Progress Notes (Signed)
  Subjective:    Patient ID: Austin [name], male    DOB: 1933/11/17, 77 y.o.   MRN: [id number]  HPI  Pt with continued nosebleeds.  No change in pattern.  Only on left side.  No trouble breathing but feels he cant go anywhere due to bleeding.  Is using nasal saline with no improvement  Review of Systems     Objective:   Physical Exam    Vital signs reviewed General appearance - alert, well appearing, and in no distress and oriented to person, place, and time Heart - normal rate, regular rhythm, normal S1, S2, no murmurs, rubs, clicks or gallops Nose- no point of bleeding identified.  Small amount of dry blood present    Assessment & Plan:

## 2011-06-06 ENCOUNTER — Emergency Department (HOSPITAL_COMMUNITY): Payer: Medicare Other

## 2011-06-06 ENCOUNTER — Emergency Department (HOSPITAL_COMMUNITY)
Admission: EM | Admit: 2011-06-06 | Discharge: 2011-06-06 | Disposition: A | Discharge status: Home / Self Care | Payer: Medicare Other | Attending: Emergency Medicine | Admitting: Emergency Medicine

## 2011-06-06 ENCOUNTER — Encounter (HOSPITAL_COMMUNITY): Payer: Self-pay | Admitting: Emergency Medicine

## 2011-06-06 DIAGNOSIS — J984 Other disorders of lung: Secondary | ICD-10-CM | POA: Insufficient documentation

## 2011-06-06 DIAGNOSIS — F17200 Nicotine dependence, unspecified, uncomplicated: Secondary | ICD-10-CM | POA: Insufficient documentation

## 2011-06-06 DIAGNOSIS — Z79899 Other long term (current) drug therapy: Secondary | ICD-10-CM | POA: Insufficient documentation

## 2011-06-06 DIAGNOSIS — R1011 Right upper quadrant pain: Secondary | ICD-10-CM | POA: Insufficient documentation

## 2011-06-06 DIAGNOSIS — R918 Other nonspecific abnormal finding of lung field: Secondary | ICD-10-CM

## 2011-06-06 LAB — DIFFERENTIAL
Basophils Absolute: 0.1 10*3/uL (ref 0.0–0.1)
Lymphs Abs: 2.2 10*3/uL (ref 0.7–4.0)
Monocytes Absolute: 1.9 10*3/uL — ABNORMAL HIGH (ref 0.1–1.0)
Monocytes Relative: 19 % — ABNORMAL HIGH (ref 3–12)

## 2011-06-06 LAB — COMPREHENSIVE METABOLIC PANEL
Alkaline Phosphatase: 102 U/L (ref 39–117)
BUN: 15 mg/dL (ref 6–23)
CO2: 29 mEq/L (ref 19–32)
Chloride: 99 mEq/L (ref 96–112)
Creatinine, Ser: 0.95 mg/dL (ref 0.50–1.35)
GFR calc Af Amer: >90 mL/min (ref >90)
GFR calc non Af Amer: 79 mL/min — ABNORMAL LOW (ref >90)
Glucose, Bld: 81 mg/dL (ref 70–99)
Potassium: 4.2 mEq/L (ref 3.5–5.1)
Total Protein: 7.8 g/dL (ref 6.0–8.3)

## 2011-06-06 LAB — CBC: MCH: 32.3 pg (ref 26.0–34.0)

## 2011-06-06 LAB — URINALYSIS, ROUTINE W REFLEX MICROSCOPIC
Glucose, UA: 100 mg/dL — AB
Ketones, ur: NEGATIVE mg/dL
Protein, ur: NEGATIVE mg/dL
Specific Gravity, Urine: 1.017 (ref 1.005–1.030)
pH: 7 (ref 5.0–8.0)

## 2011-06-06 MED ORDER — IOHEXOL 300 MG/ML  SOLN
100.0000 mL | Freq: Once | INTRAMUSCULAR | Status: AC | PRN
  Administered 2011-06-06: 100 mL via INTRAVENOUS

## 2011-06-06 MED ORDER — IOHEXOL 300 MG/ML  SOLN
20.0000 mL | INTRAMUSCULAR | Status: AC
  Administered 2011-06-06: 20 mL via ORAL

## 2011-06-06 MED ORDER — SODIUM CHLORIDE 0.9 % IV SOLN
Freq: Once | INTRAVENOUS | Status: AC
  Administered 2011-06-06: 12:00:00 via INTRAVENOUS

## 2011-06-06 NOTE — ED Notes (Signed)
Austin [name] IN CT AWARE PT HAS FINISHED PO CT PREP

## 2011-06-06 NOTE — Discharge Instructions (Signed)

## 2011-06-06 NOTE — ED Notes (Signed)
Rt sided chest and abd pain x 3 days has a cough cause he smoke states has ahd some sob with it
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
## 2011-06-06 NOTE — ED Provider Notes (Signed)
I have spoken with Pulmonary with [place] and scheduled the patient a follow up appointment.   Friday March 1 with Dr. Cyril Mourning at 3:15.   If the patient can't make it he will call the office and have it changed.  Dr. Estell Harpin has spoken with the patient and he is aware of the abnormal lung findings on CT scan.   Dorthula Matas, PA 06/06/11 1510

## 2011-06-06 NOTE — ED Provider Notes (Signed)
Medical screening examination/treatment/procedure(s) were performed by non-physician practitioner and as supervising physician I was immediately available for consultation/collaboration.   Konner Warrior L Keia Rask, MD 06/06/11 1905 

## 2011-06-06 NOTE — ED Notes (Signed)
PATIENT DENIES URINARY SX. STATES STREAM WNL. STATES HE EATS AND DRINKS WELL. MUCUS MEMBRANES PINK AND MOIST. IV ASYMPTOMATIC

## 2011-06-20 ENCOUNTER — Encounter: Payer: Self-pay | Admitting: Pulmonary Disease

## 2011-06-20 ENCOUNTER — Ambulatory Visit (INDEPENDENT_AMBULATORY_CARE_PROVIDER_SITE_OTHER): Payer: Medicare Other | Admitting: Pulmonary Disease

## 2011-06-20 VITALS — BP 160/62 | HR 87 | Temp 97.5°F | Ht 71.5 in | Wt 177.4 lb

## 2011-06-20 DIAGNOSIS — C801 Malignant (primary) neoplasm, unspecified: Secondary | ICD-10-CM

## 2011-06-20 DIAGNOSIS — C3490 Malignant neoplasm of unspecified part of unspecified bronchus or lung: Secondary | ICD-10-CM | POA: Insufficient documentation

## 2011-06-20 DIAGNOSIS — R918 Other nonspecific abnormal finding of lung field: Secondary | ICD-10-CM

## 2011-06-20 DIAGNOSIS — R222 Localized swelling, mass and lump, trunk: Secondary | ICD-10-CM

## 2011-06-20 HISTORY — DX: Malignant (primary) neoplasm, unspecified: C80.1

## 2011-06-20 NOTE — Progress Notes (Signed)
  Subjective:    Patient ID: Austin [name], male    DOB: 01-23-1934, 78 y.o.   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 He developed RUQ abdominal pain & went to the ED - CT abdomen showed  Old granulomatous disease within the liver and spleen. Prior cholecystectomy. Mildly nodular contours to the liver surface. Suggestive of cirrhosis. CXr showed lung mass. CT chest clarified this as a spiculated mass within the superior segment of the left lower  lobe measuring 3.7 x 2.3 cm. There was also a spiculated nodule in the left lower lobe along the inferior aspect of the hilum measuring 1.7 x 1.5 cm. Small somewhat spiculated nodule in the right lower lobe measured up to 9 mm. He denies cough, wheezing, hemoptysis, dyspnea on exertion. He quit drinking in 2000, smokes 1.5 PPD since his teenage years. Abdominal pain is now resolved.    Review of Systems  Constitutional: Negative for fever, appetite change and unexpected weight change.  HENT: Negative for ear pain, congestion, sore throat, rhinorrhea, sneezing, trouble swallowing, dental problem and postnasal drip.   Eyes: Negative for redness.  Respiratory: Positive for cough and shortness of breath. Negative for wheezing.   Cardiovascular: Negative for chest pain, palpitations and leg swelling.  Gastrointestinal: Positive for abdominal pain. Negative for nausea, vomiting and diarrhea.  Genitourinary: Negative for dysuria and urgency.  Musculoskeletal: Negative for joint swelling.  Skin: Negative for rash.  Neurological: Negative for syncope and headaches.  Hematological: Does not bruise/bleed easily.  Psychiatric/Behavioral: Negative for dysphoric mood. The patient is not nervous/anxious.        Objective:   Physical Exam  Gen. Pleasant, well-nourished, in no distress, normal affect ENT - no lesions, no post nasal drip Neck: No JVD, no thyromegaly, no  carotid bruits Lungs: no use of accessory muscles, no dullness to percussion, clear without rales or rhonchi  Cardiovascular: Rhythm regular, heart sounds  normal, no murmurs or gallops, no peripheral edema Abdomen: soft and non-tender, no hepatosplenomegaly, BS normal. Musculoskeletal: No deformities, no cyanosis or clubbing Neuro:  alert, non focal       Assessment & Plan:

## 2011-06-20 NOTE — Patient Instructions (Signed)
Schedule PET scan Breathing test

## 2011-06-22 NOTE — Assessment & Plan Note (Signed)
Concerning for lung malignancy. There does seem to be bronchi leading to the lung masses. Of concern is the 9mm nodule in the right lung which may be  asynchroous or metastatic malignancy. Also note the borderline enlarge lymphnodes. Will proceed with a pet scan to identify best targets for biopsy. Will also obtain pfts but am afraid we may be dealing with advanced stage here. Bronchoscopy was briefly discussed with the pt.

## 2011-06-24 ENCOUNTER — Ambulatory Visit (HOSPITAL_COMMUNITY)
Admission: RE | Admit: 2011-06-24 | Discharge: 2011-06-24 | Disposition: A | Discharge status: Home / Self Care | Payer: Medicare Other | Source: Ambulatory Visit | Attending: Pulmonary Disease | Admitting: Pulmonary Disease

## 2011-06-24 DIAGNOSIS — R059 Cough, unspecified: Secondary | ICD-10-CM | POA: Insufficient documentation

## 2011-06-24 DIAGNOSIS — R062 Wheezing: Secondary | ICD-10-CM | POA: Insufficient documentation

## 2011-06-24 DIAGNOSIS — R918 Other nonspecific abnormal finding of lung field: Secondary | ICD-10-CM

## 2011-06-24 DIAGNOSIS — J988 Other specified respiratory disorders: Secondary | ICD-10-CM | POA: Insufficient documentation

## 2011-06-24 DIAGNOSIS — R0989 Other specified symptoms and signs involving the circulatory and respiratory systems: Secondary | ICD-10-CM | POA: Insufficient documentation

## 2011-06-24 DIAGNOSIS — R05 Cough: Secondary | ICD-10-CM | POA: Insufficient documentation

## 2011-06-24 DIAGNOSIS — R0609 Other forms of dyspnea: Secondary | ICD-10-CM | POA: Insufficient documentation

## 2011-06-24 MED ORDER — ALBUTEROL SULFATE (5 MG/ML) 0.5% IN NEBU
2.5000 mg | INHALATION_SOLUTION | Freq: Once | RESPIRATORY_TRACT | Status: AC
  Administered 2011-06-24: 2.5 mg via RESPIRATORY_TRACT

## 2011-07-03 ENCOUNTER — Encounter (HOSPITAL_COMMUNITY)
Admission: RE | Admit: 2011-07-03 | Discharge: 2011-07-03 | Disposition: A | Discharge status: Home / Self Care | Payer: Medicare Other | Source: Ambulatory Visit | Attending: Pulmonary Disease | Admitting: Pulmonary Disease

## 2011-07-03 ENCOUNTER — Encounter (HOSPITAL_COMMUNITY): Payer: Self-pay

## 2011-07-03 DIAGNOSIS — R220 Localized swelling, mass and lump, head: Secondary | ICD-10-CM | POA: Insufficient documentation

## 2011-07-03 DIAGNOSIS — R918 Other nonspecific abnormal finding of lung field: Secondary | ICD-10-CM

## 2011-07-03 DIAGNOSIS — R222 Localized swelling, mass and lump, trunk: Secondary | ICD-10-CM | POA: Insufficient documentation

## 2011-07-03 DIAGNOSIS — C3490 Malignant neoplasm of unspecified part of unspecified bronchus or lung: Secondary | ICD-10-CM | POA: Insufficient documentation

## 2011-07-03 LAB — GLUCOSE, CAPILLARY: Glucose-Capillary: 86 mg/dL (ref 70–99)

## 2011-07-03 MED ORDER — FLUDEOXYGLUCOSE F - 18 (FDG) INJECTION
17.5000 | Freq: Once | INTRAVENOUS | Status: AC | PRN
  Administered 2011-07-03: 17.5 via INTRAVENOUS

## 2011-07-04 ENCOUNTER — Other Ambulatory Visit (HOSPITAL_COMMUNITY): Payer: Medicare Other

## 2011-07-04 ENCOUNTER — Ambulatory Visit: Payer: Medicare Other | Admitting: Adult Health

## 2011-07-11 ENCOUNTER — Encounter: Payer: Self-pay | Admitting: Pulmonary Disease

## 2011-07-11 ENCOUNTER — Ambulatory Visit (INDEPENDENT_AMBULATORY_CARE_PROVIDER_SITE_OTHER): Payer: Medicare Other | Admitting: Pulmonary Disease

## 2011-07-11 VITALS — BP 116/76 | HR 90 | Temp 97.4°F | Ht 71.5 in | Wt 175.4 lb

## 2011-07-11 DIAGNOSIS — R918 Other nonspecific abnormal finding of lung field: Secondary | ICD-10-CM

## 2011-07-11 DIAGNOSIS — R222 Localized swelling, mass and lump, trunk: Secondary | ICD-10-CM

## 2011-07-11 DIAGNOSIS — K118 Other diseases of salivary glands: Secondary | ICD-10-CM

## 2011-07-11 NOTE — Progress Notes (Signed)
  Subjective:    Patient ID: Austin [name], male    DOB: 15-Feb-1934, 78 y.o.   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  He developed RUQ abdominal pain & went to the ED - CT abdomen showed Old granulomatous disease within the liver and spleen. Prior cholecystectomy. Mildly nodular contours to the liver surface. Suggestive of cirrhosis. CXr showed lung mass.  CT chest clarified this as a spiculated mass within the superior segment of the left lower  lobe measuring 3.7 x 2.3 cm. There was also a spiculated nodule in the left lower lobe along the inferior aspect of the hilum measuring 1.7 x 1.5 cm. Small somewhat spiculated nodule in the right lower lobe measured up to 9 mm.  He quit drinking in 2000, smokes 1.5 PPD since his teenage years.  Abdominal pain is now resolved.  07/11/2011 PET showed - 3.7 cm posterior left lower lobe mass with max SUV 10.2, suspicious for primary bronchogenic neoplasm.  Additional spiculated 1.9 cm anterior left lower lobe nodule with max SUV 13.6, suspicious for metastasis.  9 mm spiculated right lower lobe opacity with max SUV 3.4,suspicious for contralateral metastases, synchronous primary not entirely excluded.  Small mediastinal lymph nodes measuring up to 10 mm short axis, without convincing hypermetabolism.  11 mm nodule favored to be within the deep lobe of the right parotid gland with max SUV 6.1, suspicious for primary parotid neoplasm, benign or malignant. He denies cough, wheezing, hemoptysis, dyspnea on exertion.  Has cut down to 1/2 ppd  Review of Systems Patient denies significant dyspnea,cough, hemoptysis,  chest pain, palpitations, pedal edema, orthopnea, paroxysmal nocturnal dyspnea, lightheadedness, nausea, vomiting, abdominal or  leg pains      Objective:   Physical Exam  Gen. Pleasant, well-nourished, in no distress ENT - no lesions, no post nasal drip Neck: No  JVD, no thyromegaly, no carotid bruits Lungs: no use of accessory muscles, no dullness to percussion, clear without rales or rhonchi  Cardiovascular: Rhythm regular, heart sounds  normal, no murmurs or gallops, no peripheral edema Musculoskeletal: No deformities, no cyanosis or clubbing        Assessment & Plan:

## 2011-07-11 NOTE — Assessment & Plan Note (Signed)
This will also need needle bx in the future

## 2011-07-11 NOTE — Assessment & Plan Note (Signed)
Discussed pet results Will plan for electronavigation bronchoscopy & obtain ct scan with super-d protocol - will try to biopsy both lesions on left lung Rt lung lesion may be too small a target Would prefer enb over ct guided biopsy since this will enable both lesions to be approached The risks of the procedure including coughing, bleeding and the small chance of lung puncture requiring chest tube were discussed in great detail. The benefits & alternatives including serial follow up were also discussed.

## 2011-07-11 NOTE — Patient Instructions (Signed)
We will plan to do a biopsy of the lung nodules under general anesthesia on Wednesday ,april 3rd You will need another CT scan (super-D protocol) prior to biopsy We discussed risks  Benefits & alternatives of procedure

## 2011-07-14 ENCOUNTER — Ambulatory Visit: Payer: Medicare Other | Admitting: Pulmonary Disease

## 2011-07-14 ENCOUNTER — Ambulatory Visit (INDEPENDENT_AMBULATORY_CARE_PROVIDER_SITE_OTHER)
Admission: RE | Admit: 2011-07-14 | Discharge: 2011-07-14 | Disposition: A | Discharge status: Home / Self Care | Payer: Medicare Other | Source: Ambulatory Visit | Attending: Pulmonary Disease | Admitting: Pulmonary Disease

## 2011-07-14 ENCOUNTER — Encounter (HOSPITAL_COMMUNITY): Payer: Self-pay | Admitting: Respiratory Therapy

## 2011-07-14 DIAGNOSIS — R918 Other nonspecific abnormal finding of lung field: Secondary | ICD-10-CM

## 2011-07-14 DIAGNOSIS — R222 Localized swelling, mass and lump, trunk: Secondary | ICD-10-CM

## 2011-07-15 ENCOUNTER — Other Ambulatory Visit: Payer: Self-pay | Admitting: Pulmonary Disease

## 2011-07-16 ENCOUNTER — Encounter (HOSPITAL_COMMUNITY): Payer: Self-pay

## 2011-07-16 ENCOUNTER — Encounter (HOSPITAL_COMMUNITY)
Admission: RE | Admit: 2011-07-16 | Discharge: 2011-07-16 | Disposition: A | Discharge status: Home / Self Care | Payer: Medicare Other | Source: Ambulatory Visit | Attending: Pulmonary Disease | Admitting: Pulmonary Disease

## 2011-07-16 HISTORY — DX: Shortness of breath: R06.02

## 2011-07-16 HISTORY — DX: Cerebral infarction, unspecified: I63.9

## 2011-07-16 HISTORY — DX: Reserved for inherently not codable concepts without codable children: IMO0001

## 2011-07-16 HISTORY — DX: Encounter for other specified aftercare: Z51.89

## 2011-07-16 LAB — BASIC METABOLIC PANEL
BUN: 11 mg/dL (ref 6–23)
CO2: 28 mEq/L (ref 19–32)
Chloride: 101 mEq/L (ref 96–112)
GFR calc Af Amer: >90 mL/min (ref >90)
Potassium: 4.6 mEq/L (ref 3.5–5.1)

## 2011-07-16 LAB — CBC
HCT: 44.9 % (ref 39.0–52.0)
Hemoglobin: 15.9 g/dL (ref 13.0–17.0)
MCV: 91.3 fL (ref 78.0–100.0)
WBC: 8.3 10*3/uL (ref 4.0–10.5)

## 2011-07-16 LAB — APTT: aPTT: 32 seconds (ref 24–37)

## 2011-07-16 LAB — PROTIME-INR: INR: 0.97 (ref 0.00–1.49)

## 2011-07-16 NOTE — Progress Notes (Signed)
SPOKE WITH DR ALVA TO CLARIFY ORDER, HE STATED WE DID NOT NEED TO DO CXR, PATIENT JUST HAS CT DONE.

## 2011-07-16 NOTE — Pre-Procedure Instructions (Signed)
20 Austin [name]  07/16/2011   Your procedure is scheduled on:   Wednesday 07/23/11   Report to [facility] at 630 AM.  Call this number if you have problems the morning of surgery: [phone]   Remember:   Do not eat food:After Midnight.  May have clear liquids: up to 4 Hours before arrival.  Clear liquids include soda, tea, black coffee, apple or grape juice, broth.  Take these medicines the morning of surgery with A SIP OF WATER:  NEURONTIN   Do not wear jewelry, make-up or nail polish.  Do not wear lotions, powders, or perfumes. You may wear deodorant.  Do not shave 48 hours prior to surgery.  Do not bring valuables to the hospital.  Contacts, dentures or bridgework may not be worn into surgery.  Leave suitcase in the car. After surgery it may be brought to your room.  For patients admitted to the hospital, checkout time is 11:00 AM the day of discharge.   Patients discharged the day of surgery will not be allowed to drive home.  Name and phone number of your driver:   Special Instructions: CHG Shower Use Special Wash: 1/2 bottle night before surgery and 1/2 bottle morning of surgery.   Please read over the following fact sheets that you were given: Pain Booklet, MRSA Information and Surgical Site Infection Prevention

## 2011-07-23 ENCOUNTER — Ambulatory Visit (HOSPITAL_COMMUNITY): Payer: Medicare Other

## 2011-07-23 ENCOUNTER — Encounter (HOSPITAL_COMMUNITY): Payer: Self-pay | Admitting: *Deleted

## 2011-07-23 ENCOUNTER — Ambulatory Visit (HOSPITAL_COMMUNITY)
Admission: RE | Admit: 2011-07-23 | Discharge: 2011-07-23 | Disposition: A | Discharge status: Home / Self Care | Payer: Medicare Other | Source: Ambulatory Visit | Attending: Pulmonary Disease | Admitting: Pulmonary Disease

## 2011-07-23 ENCOUNTER — Encounter (HOSPITAL_COMMUNITY)
Admission: RE | Disposition: A | Discharge status: Home / Self Care | Payer: Self-pay | Source: Ambulatory Visit | Attending: Pulmonary Disease

## 2011-07-23 ENCOUNTER — Encounter (HOSPITAL_COMMUNITY): Payer: Self-pay

## 2011-07-23 DIAGNOSIS — C3430 Malignant neoplasm of lower lobe, unspecified bronchus or lung: Secondary | ICD-10-CM | POA: Insufficient documentation

## 2011-07-23 DIAGNOSIS — R918 Other nonspecific abnormal finding of lung field: Secondary | ICD-10-CM

## 2011-07-23 DIAGNOSIS — F17200 Nicotine dependence, unspecified, uncomplicated: Secondary | ICD-10-CM | POA: Insufficient documentation

## 2011-07-23 DIAGNOSIS — R222 Localized swelling, mass and lump, trunk: Secondary | ICD-10-CM

## 2011-07-23 DIAGNOSIS — Z01812 Encounter for preprocedural laboratory examination: Secondary | ICD-10-CM | POA: Insufficient documentation

## 2011-07-23 DIAGNOSIS — R911 Solitary pulmonary nodule: Secondary | ICD-10-CM | POA: Insufficient documentation

## 2011-07-23 SURGERY — VIDEO BRONCHOSCOPY WITH ENDOBRONCHIAL NAVIGATION
Anesthesia: General | Site: Bronchus | Laterality: Bilateral | Wound class: Clean Contaminated

## 2011-07-23 MED ORDER — PROPOFOL 10 MG/ML IV EMUL
INTRAVENOUS | Status: DC | PRN
  Administered 2011-07-23: 100 mg via INTRAVENOUS

## 2011-07-23 MED ORDER — NEOSTIGMINE METHYLSULFATE 1 MG/ML IJ SOLN
INTRAMUSCULAR | Status: DC | PRN
  Administered 2011-07-23: 4 mg via INTRAVENOUS

## 2011-07-23 MED ORDER — EPHEDRINE SULFATE 50 MG/ML IJ SOLN
INTRAMUSCULAR | Status: DC | PRN
  Administered 2011-07-23: 5 mg via INTRAVENOUS
  Administered 2011-07-23: 10 mg via INTRAVENOUS

## 2011-07-23 MED ORDER — MIDAZOLAM HCL 5 MG/5ML IJ SOLN
INTRAMUSCULAR | Status: DC | PRN
  Administered 2011-07-23: 1.5 mg via INTRAVENOUS

## 2011-07-23 MED ORDER — ONDANSETRON HCL 4 MG/2ML IJ SOLN
INTRAMUSCULAR | Status: DC | PRN
  Administered 2011-07-23: 4 mg via INTRAVENOUS

## 2011-07-23 MED ORDER — MORPHINE SULFATE 2 MG/ML IJ SOLN: 0.0500 mg/kg | INTRAMUSCULAR | Status: DC | PRN

## 2011-07-23 MED ORDER — ONDANSETRON HCL 4 MG/2ML IJ SOLN: 4.0000 mg | Freq: Once | INTRAMUSCULAR | Status: DC | PRN

## 2011-07-23 MED ORDER — LACTATED RINGERS IV SOLN
INTRAVENOUS | Status: DC | PRN
  Administered 2011-07-23 (×2): via INTRAVENOUS

## 2011-07-23 MED ORDER — LIDOCAINE HCL (CARDIAC) 20 MG/ML IV SOLN
INTRAVENOUS | Status: DC | PRN
  Administered 2011-07-23: 60 mg via INTRAVENOUS

## 2011-07-23 MED ORDER — FENTANYL CITRATE 0.05 MG/ML IJ SOLN
INTRAMUSCULAR | Status: DC | PRN
  Administered 2011-07-23: 25 ug via INTRAVENOUS
  Administered 2011-07-23 (×2): 50 ug via INTRAVENOUS
  Administered 2011-07-23: 12.5 ug via INTRAVENOUS
  Administered 2011-07-23: 50 ug via INTRAVENOUS

## 2011-07-23 MED ORDER — 0.9 % SODIUM CHLORIDE (POUR BTL) OPTIME
TOPICAL | Status: DC | PRN
  Administered 2011-07-23: 1000 mL

## 2011-07-23 MED ORDER — HYDROMORPHONE HCL PF 1 MG/ML IJ SOLN: 0.2500 mg | INTRAMUSCULAR | Status: DC | PRN

## 2011-07-23 MED ORDER — GLYCOPYRROLATE 0.2 MG/ML IJ SOLN
INTRAMUSCULAR | Status: DC | PRN
  Administered 2011-07-23: .8 mg via INTRAVENOUS

## 2011-07-23 MED ORDER — PHENYLEPHRINE HCL 10 MG/ML IJ SOLN
INTRAMUSCULAR | Status: DC | PRN
  Administered 2011-07-23 (×4): 80 ug via INTRAVENOUS
  Administered 2011-07-23: 40 ug via INTRAVENOUS
  Administered 2011-07-23: 80 ug via INTRAVENOUS
  Administered 2011-07-23: 40 ug via INTRAVENOUS
  Administered 2011-07-23: 80 ug via INTRAVENOUS
  Administered 2011-07-23: 40 ug via INTRAVENOUS
  Administered 2011-07-23 (×2): 80 ug via INTRAVENOUS
  Administered 2011-07-23: 40 ug via INTRAVENOUS
  Administered 2011-07-23: 80 ug via INTRAVENOUS

## 2011-07-23 MED ORDER — VECURONIUM BROMIDE 10 MG IV SOLR
INTRAVENOUS | Status: DC | PRN
  Administered 2011-07-23: 10 mg via INTRAVENOUS

## 2011-07-23 SURGICAL SUPPLY — 31 items
BRUSH SUPERTRAX BIOPSY (INSTRUMENTS) ×1 IMPLANT
BRUSH SUPERTRAX NDL-TIP CYTO (INSTRUMENTS) ×1 IMPLANT
CANISTER SUCTION 2500CC (MISCELLANEOUS) ×2 IMPLANT
CHANNEL WORK EXTEND EDGE 180 (KITS) ×1 IMPLANT
CHANNEL WORK EXTEND EDGE 45 (KITS) IMPLANT
CHANNEL WORK EXTEND EDGE 90 (KITS) IMPLANT
CLOTH BEACON ORANGE TIMEOUT ST (SAFETY) ×2 IMPLANT
CONT SPEC 4OZ CLIKSEAL STRL BL (MISCELLANEOUS) ×3 IMPLANT
COVER TABLE BACK 60X90 (DRAPES) ×2 IMPLANT
FILTER STRAW FLUID ASPIR (MISCELLANEOUS) IMPLANT
FORCEPS BIOP SUPERTRX PREMAR (INSTRUMENTS) ×1 IMPLANT
GLOVE BIO SURGEON STRL SZ7 (GLOVE) ×6 IMPLANT
GOWN EXTRA PROTECTION XL (GOWNS) ×1 IMPLANT
KIT LOCATABLE GUIDE (CANNULA) IMPLANT
KIT MARKER FIDUCIAL DELIVERY (KITS) IMPLANT
KIT PROCEDURE EDGE 180 (KITS) IMPLANT
KIT PROCEDURE EDGE 45 (KITS) IMPLANT
KIT PROCEDURE EDGE 90 (KITS) ×1 IMPLANT
KIT ROOM TURNOVER OR (KITS) ×2 IMPLANT
MARKER SKIN DUAL TIP RULER LAB (MISCELLANEOUS) ×2 IMPLANT
NDL SUPERTRX PREMARK BIOPSY (NEEDLE) IMPLANT
NEEDLE SUPERTRX PREMARK BIOPSY (NEEDLE) ×2 IMPLANT
NS IRRIG 1000ML POUR BTL (IV SOLUTION) ×2 IMPLANT
OIL SILICONE PENTAX (PARTS (SERVICE/REPAIRS)) ×2 IMPLANT
PAD ARMBOARD 7.5X6 YLW CONV (MISCELLANEOUS) ×4 IMPLANT
PATCHES PATIENT (LABEL) ×2 IMPLANT
SPONGE GAUZE 4X4 12PLY (GAUZE/BANDAGES/DRESSINGS) ×2 IMPLANT
SYR 20ML ECCENTRIC (SYRINGE) ×2 IMPLANT
TOWEL OR 17X24 6PK STRL BLUE (TOWEL DISPOSABLE) ×2 IMPLANT
TRAP SPECIMEN MUCOUS 40CC (MISCELLANEOUS) ×2 IMPLANT
TUBE CONNECTING 12X1/4 (SUCTIONS) ×2 IMPLANT

## 2011-07-23 NOTE — Discharge Instructions (Signed)
Instructions Following General Anesthetic, Adult A nurse specialized in giving anesthesia (anesthetist) or a doctor specialized in giving anesthesia (anesthesiologist) gave you a medicine that made you sleep while a procedure was performed. For as long as 24 hours following this procedure, you may feel:  Dizzy.   Weak.   Drowsy.  AFTER THE PROCEDURE After surgery, you will be taken to the recovery area where a nurse will monitor your progress. You will be allowed to go home when you are awake, stable, taking fluids well, and without complications. For the first 24 hours following an anesthetic:  Have a responsible person with you.   Do not drive a car. If you are alone, do not take public transportation.   Do not drink alcohol.   Do not take medicine that has not been prescribed by your caregiver.   Do not sign important papers or make important decisions.   You may resume normal diet and activities as directed.   Change bandages (dressings) as directed.   Only take over-the-counter or prescription medicines for pain, discomfort, or fever as directed by your caregiver.  If you have questions or problems that seem related to the anesthetic, call the hospital and ask for the anesthetist or anesthesiologist on call. SEEK IMMEDIATE MEDICAL CARE IF:   You develop a rash.   You have difficulty breathing.   You have chest pain.   You develop any allergic problems.  Document Released: 07/14/2000 Document Revised: 03/27/2011 Document Reviewed: 02/22/2007 [employer] Patient Information 2012 [employer]. 

## 2011-07-23 NOTE — Anesthesia Postprocedure Evaluation (Signed)
  Anesthesia Post-op Note  Patient: Austin [name]  Procedure(s) Performed: Procedure(s) (LRB): VIDEO BRONCHOSCOPY WITH ENDOBRONCHIAL NAVIGATION (Bilateral)  Patient Location: PACU  Anesthesia Type: General  Level of Consciousness: awake, oriented and patient cooperative  Airway and Oxygen Therapy: Patient Spontanous Breathing and Patient connected to nasal cannula oxygen  Post-op Pain: none  Post-op Assessment: Post-op Vital signs reviewed, Patient's Cardiovascular Status Stable, Respiratory Function Stable, Patent Airway, No signs of Nausea or vomiting and Pain level controlled  Post-op Vital Signs: stable  Complications: No apparent anesthesia complications

## 2011-07-23 NOTE — Progress Notes (Signed)
  Subjective:    Patient ID: Austin [name], male    DOB: 1933-05-04, 78 y.o.   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  He developed RUQ abdominal pain & went to the ED - CT abdomen showed Old granulomatous disease within the liver and spleen. Prior cholecystectomy. Mildly nodular contours to the liver surface. Suggestive of cirrhosis. CXr showed lung mass.  CT chest clarified this as a spiculated mass within the superior segment of the left lower  lobe measuring 3.7 x 2.3 cm. There was also a spiculated nodule in the left lower lobe along the inferior aspect of the hilum measuring 1.7 x 1.5 cm. Small somewhat spiculated nodule in the right lower lobe measured up to 9 mm.  He quit drinking in 2000, smokes 1.5 PPD since his teenage years.  Abdominal pain is now resolved.  07/23/2011 PET showed - 3.7 cm posterior left lower lobe mass with max SUV 10.2, suspicious for primary bronchogenic neoplasm.  Additional spiculated 1.9 cm anterior left lower lobe nodule with max SUV 13.6, suspicious for metastasis.  9 mm spiculated right lower lobe opacity with max SUV 3.4,suspicious for contralateral metastases, synchronous primary not entirely excluded.  Small mediastinal lymph nodes measuring up to 10 mm short axis, without convincing hypermetabolism.  11 mm nodule favored to be within the deep lobe of the right parotid gland with max SUV 6.1, suspicious for primary parotid neoplasm, benign or malignant. He denies cough, wheezing, hemoptysis, dyspnea on exertion.  Has cut down to 1/2 ppd  Examined again 4/2 am, no new symptoms or interval change Exam unchanged - no wheeze. Gen. Pleasant, well-nourished, in no distress ENT - no lesions, no post nasal drip Neck: No JVD, no thyromegaly, no carotid bruits Lungs: no use of accessory muscles, no dullness to percussion, clear without rales or rhonchi  Cardiovascular: Rhythm  regular, heart sounds  normal, no murmurs or gallops, no peripheral edema Musculoskeletal: No deformities, no cyanosis or clubbing   Informed Consent: I have reviewed the patients History and Physical, chart, labs and discussed the procedure including the risks, benefits and alternatives for the proposed anesthesia with the patient  who has indicated his understanding and acceptance. His friend will pick him up after surgery. No family with him today.  Dione Petron V.

## 2011-07-23 NOTE — Preoperative (Signed)
Beta Blockers   Reason not to administer Beta Blockers:Not Applicable 

## 2011-07-23 NOTE — Procedures (Signed)
Video Bronchoscopy with Electromagnetic Navigation Procedure Note  Date of Operation: 07/23/2011  Pre-op Diagnosis: LLL nodules  Post-op Diagnosis: LLL nodules - likely NSCLC  Surgeon: Cleola Perryman  Assistants: -  Anesthesia: General endotracheal anesthesia  Operation: Flexible video fiberoptic bronchoscopy with electromagnetic navigation and biopsies.  Estimated Blood Loss: less than 5  Complications: none  Indications and History: Austin [name] is a 78 y.o. male with LLL nodules, PET +.  The risks, benefits, complications, treatment options and expected outcomes were discussed with the patient.  The possibilities of pneumothorax, pneumonia, reaction to medication, pulmonary aspiration, perforation of a viscus, bleeding, failure to diagnose a condition and creating a complication requiring transfusion or operation were discussed with the patient who freely signed the consent.    Description of Procedure: The patient was seen in the Preoperative Area, was examined and was deemed appropriate to proceed.  The patient was taken to OR7, identified as Austin [name] and the procedure verified as Flexible Video Fiberoptic Bronchoscopy.  A Time Out was held and the above information confirmed.   Prior to the date of the procedure a high-resolution CT scan of the chest was performed. Utilizing superDimension software a virtual tracheobronchial tree was generated to allow the creation of distinct navigation pathways to the patient's parenchymal abnormalities. After being taken to the operating room general anesthesia was initiated and the patient  was orally intubated. The video fiberoptic bronchoscope was introduced via the endotracheal tube and a general inspection was performed which showed no endobronchial lesions. The extendable working channel and locator guide were introduced into the bronchoscope. The distinct navigation pathways prepared prior to this procedure were then utilized to navigate to  within 1.5 cm  of patient's lesion -target T1- identified on CT scan. The extendable working channel was secured into place and the locator guide was withdrawn. Under fluoroscopic guidance transbronchial needle brushings, transbronchial Wang needle biopsies, and transbronchial forceps biopsies were performed to be sent for cytology and pathology. A bronchioalveolar lavage was performed in the superior segment  Of LLL  and sent for cytology and microbiology (bacterial, fungal, AFB smears and cultures). The above steps were repeated for target T2 (proximal lesion) but inspite of using a 180 catheter, we could not navigate to this lesion. The third lesion on the right side was not approachable either since no airway was seen leading to it.  At the end of the procedure a general airway inspection was performed and there was no evidence of active bleeding. The bronchoscope was removed.  The patient tolerated the procedure well. There was no significant blood loss and there were no obvious complications. A post-procedural chest x-ray is pending.  Samples: 1. Transbronchial needle brushings from LLL 2. Transbronchial Wang needle biopsies from LLL 3. Transbronchial forceps biopsies from LLL 4. Bronchoalveolar lavage from LLL   Plans:  The patient will be discharged from the PACU to home when recovered from anesthesia and after chest x-ray is reviewed. We will review the cytology, pathology and microbiology results with the patient when they become available. Outpatient followup will be with Dr Vassie Loll on 07/29/11.    Tyreanna Bisesi V.  07/23/2011

## 2011-07-23 NOTE — Transfer of Care (Signed)
Immediate Anesthesia Transfer of Care Note  Patient: Austin [name]  Procedure(s) Performed: Procedure(s) (LRB): VIDEO BRONCHOSCOPY WITH ENDOBRONCHIAL NAVIGATION (Bilateral)  Patient Location: PACU  Anesthesia Type: General  Level of Consciousness: awake, alert  and oriented  Airway & Oxygen Therapy: Patient Spontanous Breathing and Patient connected to face mask oxygen  Post-op Assessment: Report given to PACU RN, Post -op Vital signs reviewed and stable and Patient moving all extremities X 4  Post vital signs: Reviewed and stable  Complications: No apparent anesthesia complications

## 2011-07-23 NOTE — Anesthesia Procedure Notes (Signed)
Procedure Name: Intubation Date/Time: 07/23/2011 8:30 AM Performed by: Carmela Rima Pre-anesthesia Checklist: Emergency Drugs available, Patient identified, Timeout performed, Suction available and Patient being monitored Patient Re-evaluated:Patient Re-evaluated prior to inductionOxygen Delivery Method: Circle system utilized Preoxygenation: Pre-oxygenation with 100% oxygen Intubation Type: IV induction Ventilation: Mask ventilation without difficulty Laryngoscope Size: Mac and 3 Grade View: Grade II Tube type: Oral Tube size: 8.5 mm Number of attempts: 1 Placement Confirmation: ETT inserted through vocal cords under direct vision,  breath sounds checked- equal and bilateral and positive ETCO2 Secured at: 22 cm Tube secured with: Tape Dental Injury: Teeth and Oropharynx as per pre-operative assessment

## 2011-07-23 NOTE — Anesthesia Preprocedure Evaluation (Addendum)
Anesthesia Evaluation  Patient identified by MRN, date of birth, ID band Patient awake    Reviewed: Allergy & Precautions, H&P , NPO status , Patient's Chart, lab work & pertinent test results, reviewed documented beta blocker date and time   Airway Mallampati: II TM Distance: >3 FB Neck ROM: full    Dental  (+) Dental Advidsory Given, Edentulous Upper and Edentulous Lower   Pulmonary shortness of breath and with exertion, Current Smoker,          Cardiovascular     Neuro/Psych Seizures -, Well Controlled,  CVA, Residual Symptoms    GI/Hepatic GERD-  Controlled,  Endo/Other    Renal/GU      Musculoskeletal   Abdominal   Peds  Hematology   Anesthesia Other Findings   Reproductive/Obstetrics                          Anesthesia Physical Anesthesia Plan  ASA: III  Anesthesia Plan: General ETT   Post-op Pain Management:    Induction: Intravenous  Airway Management Planned: Oral ETT  Additional Equipment:   Intra-op Plan:   Post-operative Plan: Extubation in OR  Informed Consent: I have reviewed the patients History and Physical, chart, labs and discussed the procedure including the risks, benefits and alternatives for the proposed anesthesia with the patient or authorized representative who has indicated his/her understanding and acceptance.   Dental Advisory Given  Plan Discussed with: CRNA and Surgeon  Anesthesia Plan Comments:        Anesthesia Quick Evaluation

## 2011-07-24 NOTE — H&P (Signed)
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  He developed RUQ abdominal pain & went to the ED - CT abdomen showed Old granulomatous disease within the liver and spleen. Prior cholecystectomy. Mildly nodular contours to the liver surface. Suggestive of cirrhosis. CXr showed lung mass.  CT chest clarified this as a spiculated mass within the superior segment of the left lower  lobe measuring 3.7 x 2.3 cm. There was also a spiculated nodule in the left lower lobe along the inferior aspect of the hilum measuring 1.7 x 1.5 cm. Small somewhat spiculated nodule in the right lower lobe measured up to 9 mm.  He quit drinking in 2000, smokes 1.5 PPD since his teenage years.  Abdominal pain is now resolved.  07/23/2011  PET showed - 3.7 cm posterior left lower lobe mass with max SUV 10.2, suspicious for primary bronchogenic neoplasm.  Additional spiculated 1.9 cm anterior left lower lobe nodule with max SUV 13.6, suspicious for metastasis.  9 mm spiculated right lower lobe opacity with max SUV 3.4,suspicious for contralateral metastases, synchronous primary not entirely excluded.  Small mediastinal lymph nodes measuring up to 10 mm short axis, without convincing hypermetabolism.  11 mm nodule favored to be within the deep lobe of the right parotid gland with max SUV 6.1, suspicious for primary parotid neoplasm, benign or malignant.  He denies cough, wheezing, hemoptysis, dyspnea on exertion.  Has cut down to 1/2 ppd   Examined again 4/2 am, no new symptoms or interval change  Exam unchanged - no wheeze.  Gen. Pleasant, well-nourished, in no distress  ENT - no lesions, no post nasal drip  Neck: No JVD, no thyromegaly, no carotid bruits  Lungs: no use of accessory muscles, no dullness to percussion, clear without rales or rhonchi  Cardiovascular: Rhythm regular, heart sounds normal, no murmurs or gallops, no peripheral edema  Musculoskeletal: No  deformities, no cyanosis or clubbing   Informed Consent: I have reviewed the patients History and Physical, chart, labs and discussed the procedure including the risks, benefits and alternatives for the proposed anesthesia with the patient who has indicated his understanding and acceptance. His friend will pick him up after surgery. No family with him today.   Austin Iversen V.

## 2011-07-25 LAB — CULTURE, RESPIRATORY W GRAM STAIN

## 2011-07-31 ENCOUNTER — Other Ambulatory Visit: Payer: Self-pay | Admitting: Family Medicine

## 2011-08-06 ENCOUNTER — Telehealth: Payer: Self-pay | Admitting: Pulmonary Disease

## 2011-08-06 NOTE — Telephone Encounter (Signed)
Needs OV to discuss biopsy results Ok to add on

## 2011-08-06 NOTE — Telephone Encounter (Signed)
I spoke with pt and he is scheduled to come in and see RA 08/07/11 at 1:30 to discuss these results

## 2011-08-07 ENCOUNTER — Ambulatory Visit (INDEPENDENT_AMBULATORY_CARE_PROVIDER_SITE_OTHER): Payer: Medicare Other | Admitting: Pulmonary Disease

## 2011-08-07 ENCOUNTER — Encounter: Payer: Self-pay | Admitting: Pulmonary Disease

## 2011-08-07 VITALS — BP 122/68 | HR 90 | Temp 97.8°F | Ht 71.5 in | Wt 175.4 lb

## 2011-08-07 DIAGNOSIS — R918 Other nonspecific abnormal finding of lung field: Secondary | ICD-10-CM

## 2011-08-07 DIAGNOSIS — K118 Other diseases of salivary glands: Secondary | ICD-10-CM

## 2011-08-07 DIAGNOSIS — R222 Localized swelling, mass and lump, trunk: Secondary | ICD-10-CM

## 2011-08-07 NOTE — Progress Notes (Signed)
  Subjective:    Patient ID: Austin [name], male    DOB: [birth year], 78 y.o.   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  He developed RUQ abdominal pain & went to the ED - CT abdomen showed Old granulomatous disease within the liver and spleen. Prior cholecystectomy. Mildly nodular contours to the liver surface. Suggestive of cirrhosis. CXr showed lung mass.  CT chest clarified this as a spiculated mass within the superior segment of the left lower  lobe measuring 3.7 x 2.3 cm. There was also a spiculated nodule in the left lower lobe along the inferior aspect of the hilum measuring 1.7 x 1.5 cm. Small somewhat spiculated nodule in the right lower lobe measured up to 9 mm.  He quit drinking in 2000, smokes 1.5 PPD since his teenage years.  Abdominal pain is now resolved.  PET showed - 3.7 cm posterior left lower lobe mass with max SUV 10.2, suspicious for primary bronchogenic neoplasm.  Additional spiculated 1.9 cm anterior left lower lobe nodule with max SUV 13.6, suspicious for metastasis.  9 mm spiculated right lower lobe opacity with max SUV 3.4 Small mediastinal lymph nodes measuring up to 10 mm short axis, without convincing hypermetabolism.  11 mm nodule favored to be within the deep lobe of the right parotid gland with max SUV 6.1, suspicious for primary parotid neoplasm, benign or malignant.  08/07/2011 He denies cough, wheezing, hemoptysis, dyspnea on exertion.  Has cut down to 1/2 ppd ENB biopsy of larger LLL mass showed squamous cell CA. Smaller left mass could not be approached  Review of Systems  Patient denies significant dyspnea,cough, hemoptysis,  chest pain, palpitations, pedal edema, orthopnea, paroxysmal nocturnal dyspnea, lightheadedness, nausea, vomiting, abdominal or  leg pains      Objective:   Physical Exam   Gen. Pleasant, well-nourished, in no distress ENT - no lesions, no post  nasal drip Neck: No JVD, no thyromegaly, no carotid bruits Lungs: no use of accessory muscles, no dullness to percussion, clear without rales or rhonchi  Cardiovascular: Rhythm regular, heart sounds  normal, no murmurs or gallops, no peripheral edema Musculoskeletal: No deformities, no cyanosis or clubbing        Assessment & Plan:

## 2011-08-07 NOTE — Assessment & Plan Note (Signed)
Favor LLL squamous primary with ipsilateral met  (? LN)& perhaps 9mm contralateral synchronous primary - at least stg 3A Refer to Oncology - [facility], assume approach will be chemoRX then restage, doubt surgical candidate RLL  Nodule will need to be watched for growth.

## 2011-08-07 NOTE — Assessment & Plan Note (Signed)
This will also need biopsy at some point,defer to oncology

## 2011-08-07 NOTE — Patient Instructions (Signed)
You have non small cell lung cancer (squamous kind) in your left lung You have additional nodule sin your left lung & rt lung & in your parotid gland Oncology appt

## 2011-08-14 ENCOUNTER — Ambulatory Visit (HOSPITAL_BASED_OUTPATIENT_CLINIC_OR_DEPARTMENT_OTHER): Payer: Medicare Other | Admitting: Internal Medicine

## 2011-08-14 ENCOUNTER — Encounter: Payer: Self-pay | Admitting: Internal Medicine

## 2011-08-14 ENCOUNTER — Encounter: Payer: Self-pay | Admitting: *Deleted

## 2011-08-14 ENCOUNTER — Other Ambulatory Visit: Payer: Self-pay

## 2011-08-14 ENCOUNTER — Encounter (HOSPITAL_COMMUNITY): Payer: Self-pay | Admitting: Pharmacy Technician

## 2011-08-14 VITALS — BP 150/84 | HR 79 | Temp 97.2°F | Resp 18 | Ht 71.0 in | Wt 177.0 lb

## 2011-08-14 DIAGNOSIS — C3490 Malignant neoplasm of unspecified part of unspecified bronchus or lung: Secondary | ICD-10-CM

## 2011-08-14 DIAGNOSIS — C3430 Malignant neoplasm of lower lobe, unspecified bronchus or lung: Secondary | ICD-10-CM

## 2011-08-14 NOTE — Progress Notes (Signed)
[facility] Brief Psychosocial Assessment Clinical Social Work  Clinical Social Work met with patient, patient's sister, Systems developer, and pharmacist today during [facility].  MD provided information on diagnosis and treatment plan. Patient verbalized understanding and agreed with treatment plan.  Patient lives in [place] and has one living relative, sister [name].  He has no questions at this time, CSW reviewed smoking cessation program and other support services. CSW encouraged patient to call if any additional needs should arise.  Kathrin Penner, MSW, [facility] Clinical Social Worker [facility] [phone]

## 2011-08-14 NOTE — Progress Notes (Signed)
Spoke with pt at [facility] today 08/14/11.  Educational/resource information given.

## 2011-08-15 ENCOUNTER — Encounter: Payer: Self-pay | Admitting: *Deleted

## 2011-08-15 ENCOUNTER — Telehealth: Payer: Self-pay | Admitting: Internal Medicine

## 2011-08-15 ENCOUNTER — Encounter (HOSPITAL_COMMUNITY): Payer: Self-pay | Admitting: *Deleted

## 2011-08-15 ENCOUNTER — Other Ambulatory Visit: Payer: Self-pay | Admitting: Internal Medicine

## 2011-08-15 DIAGNOSIS — C3490 Malignant neoplasm of unspecified part of unspecified bronchus or lung: Secondary | ICD-10-CM

## 2011-08-15 NOTE — Progress Notes (Signed)
Spoke with Austin [name] at Dr. Scheryl Darter office, notified her that pt states he is not having the surgery scheduled for Monday.

## 2011-08-15 NOTE — Telephone Encounter (Signed)
s/w pt and he is aware of appt for 5/2,pt stated that he did not want to do the port,I l/m for allison at [facility] to advise   aom
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
## 2011-08-18 ENCOUNTER — Encounter (HOSPITAL_COMMUNITY): Admission: RE | Payer: Self-pay | Source: Ambulatory Visit

## 2011-08-18 ENCOUNTER — Ambulatory Visit (HOSPITAL_COMMUNITY): Admission: RE | Admit: 2011-08-18 | Payer: Medicare Other | Source: Ambulatory Visit | Admitting: Thoracic Surgery

## 2011-08-18 HISTORY — DX: Malignant (primary) neoplasm, unspecified: C80.1

## 2011-08-18 SURGERY — INSERTION, TUNNELED CENTRAL VENOUS DEVICE, WITH PORT
Anesthesia: Monitor Anesthesia Care | Site: Chest | Laterality: Right

## 2011-08-20 ENCOUNTER — Telehealth: Payer: Self-pay | Admitting: *Deleted

## 2011-08-20 ENCOUNTER — Encounter: Payer: Self-pay | Admitting: *Deleted

## 2011-08-20 NOTE — Progress Notes (Signed)
Pt is requesting to have all records faxed to hospice in [facility].  Per Dr Donnald Garre, pt needs to keep f/u appt tomorrow to discuss pt's decision to go on hospice.  Spoke with pt's son [phone] and he will inform pt to come to appt tomorrow.  SLJ

## 2011-08-21 ENCOUNTER — Ambulatory Visit (HOSPITAL_BASED_OUTPATIENT_CLINIC_OR_DEPARTMENT_OTHER): Payer: Medicare Other | Admitting: Internal Medicine

## 2011-08-21 ENCOUNTER — Other Ambulatory Visit: Payer: Medicare Other | Admitting: Lab

## 2011-08-21 VITALS — BP 136/79 | HR 95 | Temp 96.8°F | Ht 71.0 in | Wt 173.0 lb

## 2011-08-21 DIAGNOSIS — C3430 Malignant neoplasm of lower lobe, unspecified bronchus or lung: Secondary | ICD-10-CM

## 2011-08-21 DIAGNOSIS — C7800 Secondary malignant neoplasm of unspecified lung: Secondary | ICD-10-CM

## 2011-08-21 DIAGNOSIS — C3490 Malignant neoplasm of unspecified part of unspecified bronchus or lung: Secondary | ICD-10-CM

## 2011-08-21 NOTE — Telephone Encounter (Signed)
Opened in error

## 2011-08-23 NOTE — Progress Notes (Signed)
[facility] Telephone:([phone]) [phone]   Fax:(336) [phone]  OFFICE PROGRESS NOTE  [name], MD, MD [address] [place] [address]  DIAGNOSIS: Metastatic non-small cell lung cancer, squamous cell carcinoma presenting with 2 nodules in the left lung as well as one nodule in the right lung diagnosed in March of 2013.  PRIOR THERAPY: None  CURRENT THERAPY: None  INTERVAL HISTORY: Austin [name] 78 y.o. male returns to the clinic today for followup visit accompanied by a family member. The patient is feeling fine today with no specific complaints. During his initial evaluation and there was a discussion about enrolling the patient on the clinical trial according to the Lilly protocol with carboplatin, paclitaxel and Necitumomab versus standard chemotherapy with carboplatin and paclitaxel. The patient called a few days after his initial visit and indicated that he may not be interested in proceeding with any systemic therapy because of concern about toxicities that he saw with other family members. He came today for further evaluation and discussion of his treatment options. He denied having any significant chest pain or shortness of breath, he has no cough or hemoptysis. No significant weight loss or night sweats.  MEDICAL HISTORY: Past Medical History  Diagnosis Date  . Stroke     1980S  . Shortness of breath     WITH COUGH  . Blood transfusion   . Cancer 06/2011    lung    ALLERGIES:  is allergic to penicillins.  MEDICATIONS:  Current Outpatient Prescriptions  Medication Sig Dispense Refill  . aspirin 81 MG tablet Take 81 mg by mouth daily.      . Chlorphen-Phenyleph-ASA (ALKA-SELTZER PLUS COLD PO) Take 1 tablet by mouth as needed. For cold symptoms      . gabapentin (NEURONTIN) 300 MG capsule Take 300 mg by mouth 3 (three) times daily.        SURGICAL HISTORY:  Past Surgical History  Procedure Date  . Cholecystectomy   . Cardiovascular  stress test     UNSURE OF DATE  ([facility])    REVIEW OF SYSTEMS:  A comprehensive review of systems was negative.   PHYSICAL EXAMINATION: General appearance: alert, cooperative and no distress Head: Normocephalic, without obvious abnormality, atraumatic Neck: no adenopathy Lymph nodes: Cervical, supraclavicular, and axillary nodes normal. Resp: clear to auscultation bilaterally Cardio: regular rate and rhythm, S1, S2 normal, no murmur, click, rub or gallop GI: soft, non-tender; bowel sounds normal; no masses,  no organomegaly Extremities: extremities normal, atraumatic, no cyanosis or edema Neurologic: Alert and oriented X 3, normal strength and tone. Normal symmetric reflexes. Normal coordination and gait  ECOG PERFORMANCE STATUS: 1 - Symptomatic but completely ambulatory  Blood pressure 136/79, pulse 95, temperature 96.8 F (36 C), temperature source Oral, height 5\' 11"  (1.803 m), weight 173 lb (78.472 kg).  LABORATORY DATA: Lab Results  Component Value Date   WBC 8.3 07/16/2011   HGB 15.9 07/16/2011   HCT 44.9 07/16/2011   MCV 91.3 07/16/2011   PLT 233 07/16/2011      Chemistry      Component Value Date/Time   NA 137 07/16/2011 0952   K 4.6 07/16/2011 0952   CL 101 07/16/2011 0952   CO2 28 07/16/2011 0952   BUN 11 07/16/2011 0952   CREATININE 0.91 07/16/2011 0952      Component Value Date/Time   CALCIUM 9.4 07/16/2011 0952   ALKPHOS 102 06/06/2011 1145   AST 18 06/06/2011 1145  ALT 17 06/06/2011 1145   BILITOT 0.4 06/06/2011 1145       RADIOGRAPHIC STUDIES: No results found.  ASSESSMENT: This is a very pleasant 78 years old white male with metastatic non-small cell lung cancer, squamous cell carcinoma.  PLAN: I have a lengthy discussion with the patient and his family member about his current condition and treatment options. I discussed with the patient adverse effect of the chemotherapy again and I assured him that with his current good performance status he would  do fairly well with his treatment. I also gave the patient the option of considering palliative radiotherapy to the pulmonary nodules but he absolutely refused systemic chemotherapy. The patient would like to discuss this option with the family again. He will call me in the next one to 2 weeks with his final decision. I did not give the patient a followup appointment at this point but will be happy to arrange for his treatment once I hear from him.  All questions were answered. The patient knows to call the clinic with any problems, questions or concerns. We can certainly see the patient much sooner if necessary.  I spent 15 minutes counseling the patient face to face. The total time spent in the appointment was 25 minutes.

## 2011-08-26 ENCOUNTER — Telehealth: Payer: Self-pay | Admitting: *Deleted

## 2011-08-26 NOTE — Telephone Encounter (Signed)
I spoke with pt last week at [facility]. He is still deciding if he should get treatment and wanted to speak with his family.  He stated he will talk to them over the weekend.  I called today to follow up with him on his decision.  He is still considering treatment but needs more time to give a definate answerer.  I will follow up with him in a few weeks.

## 2011-08-28 ENCOUNTER — Ambulatory Visit: Payer: Medicare Other | Admitting: Pulmonary Disease

## 2011-09-09 ENCOUNTER — Ambulatory Visit (INDEPENDENT_AMBULATORY_CARE_PROVIDER_SITE_OTHER): Payer: Medicare Other | Admitting: Pulmonary Disease

## 2011-09-09 ENCOUNTER — Telehealth: Payer: Self-pay | Admitting: *Deleted

## 2011-09-09 DIAGNOSIS — C3490 Malignant neoplasm of unspecified part of unspecified bronchus or lung: Secondary | ICD-10-CM

## 2011-09-09 LAB — PULMONARY FUNCTION TEST

## 2011-09-09 NOTE — Progress Notes (Signed)
PFT done today. 

## 2011-09-09 NOTE — Telephone Encounter (Signed)
Spoke with pt regarding plans for treatment.  He stated he is getting second opinion and will let us know of his decision.

## 2011-09-16 ENCOUNTER — Encounter: Payer: Self-pay | Admitting: Internal Medicine

## 2011-09-22 ENCOUNTER — Telehealth: Payer: Self-pay | Admitting: *Deleted

## 2011-09-22 NOTE — Telephone Encounter (Signed)
Spoke with pt regarding tx plans.  He stated he was going to see a doctor in [facility] and he will decide after that.

## 2011-09-29 ENCOUNTER — Telehealth: Payer: Self-pay | Admitting: *Deleted

## 2011-09-29 ENCOUNTER — Other Ambulatory Visit: Payer: Self-pay | Admitting: *Deleted

## 2011-09-29 ENCOUNTER — Telehealth: Payer: Self-pay | Admitting: Internal Medicine

## 2011-09-29 DIAGNOSIS — C3490 Malignant neoplasm of unspecified part of unspecified bronchus or lung: Secondary | ICD-10-CM

## 2011-09-29 NOTE — Telephone Encounter (Signed)
called pt with 6/19 apts    aom

## 2011-09-29 NOTE — Telephone Encounter (Signed)
Spoke with pt regarding treatment plan.  Pt stated he would like to get treatment at [facility].  I spoke with Dr. Arbutus Ped and he will schedule.  I told Mr. Risdon that scheduling will call him this week with appt.

## 2011-10-08 ENCOUNTER — Telehealth: Payer: Self-pay | Admitting: Internal Medicine

## 2011-10-08 ENCOUNTER — Ambulatory Visit (HOSPITAL_BASED_OUTPATIENT_CLINIC_OR_DEPARTMENT_OTHER): Payer: Medicare Other | Admitting: Internal Medicine

## 2011-10-08 ENCOUNTER — Other Ambulatory Visit (HOSPITAL_BASED_OUTPATIENT_CLINIC_OR_DEPARTMENT_OTHER): Payer: Medicare Other | Admitting: Lab

## 2011-10-08 VITALS — BP 138/78 | HR 86 | Temp 96.9°F | Ht 71.0 in | Wt 173.1 lb

## 2011-10-08 DIAGNOSIS — C3430 Malignant neoplasm of lower lobe, unspecified bronchus or lung: Secondary | ICD-10-CM

## 2011-10-08 DIAGNOSIS — R918 Other nonspecific abnormal finding of lung field: Secondary | ICD-10-CM

## 2011-10-08 DIAGNOSIS — C3490 Malignant neoplasm of unspecified part of unspecified bronchus or lung: Secondary | ICD-10-CM

## 2011-10-08 DIAGNOSIS — C7800 Secondary malignant neoplasm of unspecified lung: Secondary | ICD-10-CM

## 2011-10-08 LAB — CBC WITH DIFFERENTIAL/PLATELET
Basophils Absolute: 0.1 10*3/uL (ref 0.0–0.1)
EOS%: 2.1 % (ref 0.0–7.0)
HCT: 43.2 % (ref 38.4–49.9)
HGB: 15 g/dL (ref 13.0–17.1)
LYMPH%: 29.2 % (ref 14.0–49.0)
MCH: 32.8 pg (ref 27.2–33.4)
MONO#: 1.2 10*3/uL — ABNORMAL HIGH (ref 0.1–0.9)
NEUT%: 52.2 % (ref 39.0–75.0)
Platelets: 225 10*3/uL (ref 140–400)
lymph#: 2.3 10*3/uL (ref 0.9–3.3)

## 2011-10-08 MED ORDER — PROCHLORPERAZINE MALEATE 10 MG PO TABS: 10.0000 mg | ORAL_TABLET | Freq: Four times a day (QID) | ORAL | Status: DC | PRN

## 2011-10-08 NOTE — Telephone Encounter (Signed)
Gave calendar to patient for June, July and August, chemo was moved to 6/28 per MD use to availability. MD is suposed to enter order for portacath

## 2011-10-08 NOTE — Progress Notes (Signed)
[facility] Telephone:([phone]   Fax:([phone]  OFFICE PROGRESS NOTE  [name], MD [address] [place] [address]  DIAGNOSIS: Metastatic non-small cell lung cancer, squamous cell carcinoma presenting with 2 nodules in the left lung as well as one nodule in the right lung diagnosed in March of 2013.   PRIOR THERAPY: None   CURRENT THERAPY: The patient will start next week the first cycle of systemic chemotherapy with carboplatin for AUC of 5 and paclitaxel 175 mg/M2 every 3 weeks with Neulasta support.  INTERVAL HISTORY: Austin [name] 78 y.o. male returns to the clinic today for followup visit accompanied by his sister and brother-in-law. The patient is feeling fine today with no specific complaints. He went for second opinion at [facility] and they agreed with our plan for his treatment with systemic therapy. He came today for evaluation and consideration of starting treatment. He denied having any significant chest pain or shortness breath, no cough or hemoptysis. He has no weight loss or night sweats. The patient still very active.  MEDICAL HISTORY: Past Medical History  Diagnosis Date  . Stroke     1980S  . Shortness of breath     WITH COUGH  . Blood transfusion   . Cancer 06/2011    lung    ALLERGIES:  is allergic to penicillins.  MEDICATIONS:  Current Outpatient Prescriptions  Medication Sig Dispense Refill  . aspirin 81 MG tablet Take 81 mg by mouth daily.      . Chlorphen-Phenyleph-ASA (ALKA-SELTZER PLUS COLD PO) Take 1 tablet by mouth as needed. For cold symptoms      . gabapentin (NEURONTIN) 300 MG capsule Take 300 mg by mouth 3 (three) times daily.        SURGICAL HISTORY:  Past Surgical History  Procedure Date  . Cholecystectomy   . Cardiovascular stress test     UNSURE OF DATE  ([facility])    REVIEW OF SYSTEMS:  A comprehensive review of systems was negative.   PHYSICAL EXAMINATION:  General appearance: alert, cooperative and no distress Head: Normocephalic, without obvious abnormality, atraumatic Neck: no adenopathy Lymph nodes: Cervical, supraclavicular, and axillary nodes normal. Resp: clear to auscultation bilaterally Cardio: regular rate and rhythm, S1, S2 normal, no murmur, click, rub or gallop GI: soft, non-tender; bowel sounds normal; no masses,  no organomegaly Extremities: extremities normal, atraumatic, no cyanosis or edema Neurologic: Alert and oriented X 3, normal strength and tone. Normal symmetric reflexes. Normal coordination and gait  ECOG PERFORMANCE STATUS: 0 - Asymptomatic  Blood pressure 138/78, pulse 86, temperature 96.9 F (36.1 C), temperature source Oral, height 5\' 11"  (1.803 m), weight 173 lb 1.6 oz (78.518 kg).  LABORATORY DATA: Lab Results  Component Value Date   WBC 8.0 10/08/2011   HGB 15.0 10/08/2011   HCT 43.2 10/08/2011   MCV 94.6 10/08/2011   PLT 225 10/08/2011      Chemistry      Component Value Date/Time   NA 137 07/16/2011 0952   K 4.6 07/16/2011 0952   CL 101 07/16/2011 0952   CO2 28 07/16/2011 0952   BUN 11 07/16/2011 0952   CREATININE 0.91 07/16/2011 0952      Component Value Date/Time   CALCIUM 9.4 07/16/2011 0952   ALKPHOS 102 06/06/2011 1145   AST 18 06/06/2011 1145   ALT 17 06/06/2011 1145   BILITOT 0.4 06/06/2011 1145       RADIOGRAPHIC STUDIES: No  results found.  ASSESSMENT: This is a very pleasant 78 years old white male with metastatic non-small cell lung cancer, squamous cell carcinoma.  PLAN: I have a lengthy discussion with the patient today about his condition and treatment options. I recommended for him systemic chemotherapy with carboplatin for AUC of 5 and paclitaxel 175 mg/M2 every 3 weeks with Neulasta support. I discussed with the patient adverse effect of this treatment including but not limited to alopecia, myelosuppression, nausea vomiting, peripheral neuropathy, liver or renal dysfunction. The  patient agreed to proceed with treatment as planned. He is expected to start the first cycle of his treatment on 10/17/2011. He would have a chemotherapy education class before starting the first cycle. He was given prescription for Compazine 10 mg by mouth every 6 hours as needed for nausea. The patient would come back for followup visit in 2 weeks for evaluation and management any adverse effect of his treatment. He was advised to call me immediately if he has any concerning symptoms in the interval.  All questions were answered. The patient knows to call the clinic with any problems, questions or concerns. We can certainly see the patient much sooner if necessary.  I spent 20 minutes counseling the patient face to face. The total time spent in the appointment was 30 minutes.

## 2011-10-09 ENCOUNTER — Other Ambulatory Visit: Payer: Self-pay | Admitting: Hematology and Oncology

## 2011-10-09 ENCOUNTER — Telehealth: Payer: Self-pay | Admitting: Internal Medicine

## 2011-10-09 ENCOUNTER — Other Ambulatory Visit: Payer: Self-pay | Admitting: Internal Medicine

## 2011-10-09 ENCOUNTER — Other Ambulatory Visit: Payer: Self-pay | Admitting: *Deleted

## 2011-10-09 ENCOUNTER — Other Ambulatory Visit: Payer: Medicare Other

## 2011-10-09 ENCOUNTER — Encounter: Payer: Self-pay | Admitting: *Deleted

## 2011-10-09 DIAGNOSIS — C3490 Malignant neoplasm of unspecified part of unspecified bronchus or lung: Secondary | ICD-10-CM

## 2011-10-09 MED ORDER — LIDOCAINE-PRILOCAINE 2.5-2.5 % EX CREA: TOPICAL_CREAM | CUTANEOUS | Status: AC | PRN

## 2011-10-09 NOTE — Telephone Encounter (Signed)
Talked to pt , gave him appt date and time for Portacath placement on 10/15/11 @ [facility] 12 pm

## 2011-10-10 ENCOUNTER — Other Ambulatory Visit: Payer: Self-pay | Admitting: Radiology

## 2011-10-10 ENCOUNTER — Other Ambulatory Visit: Payer: Self-pay | Admitting: Physician Assistant

## 2011-10-10 ENCOUNTER — Encounter (HOSPITAL_COMMUNITY): Payer: Self-pay | Admitting: Pharmacy Technician

## 2011-10-13 ENCOUNTER — Other Ambulatory Visit: Payer: Self-pay | Admitting: Radiology

## 2011-10-14 ENCOUNTER — Other Ambulatory Visit: Payer: Self-pay | Admitting: Radiology

## 2011-10-14 ENCOUNTER — Other Ambulatory Visit: Payer: Medicare Other | Admitting: Lab

## 2011-10-15 ENCOUNTER — Encounter (HOSPITAL_COMMUNITY): Payer: Self-pay

## 2011-10-15 ENCOUNTER — Ambulatory Visit (HOSPITAL_COMMUNITY)
Admission: RE | Admit: 2011-10-15 | Discharge: 2011-10-15 | Disposition: A | Discharge status: Home / Self Care | Payer: Medicare Other | Source: Ambulatory Visit | Attending: Internal Medicine | Admitting: Internal Medicine

## 2011-10-15 ENCOUNTER — Other Ambulatory Visit: Payer: Self-pay | Admitting: Internal Medicine

## 2011-10-15 VITALS — BP 131/79 | HR 85 | Temp 97.5°F | Resp 16 | Ht 70.0 in | Wt 177.0 lb

## 2011-10-15 DIAGNOSIS — C3490 Malignant neoplasm of unspecified part of unspecified bronchus or lung: Secondary | ICD-10-CM

## 2011-10-15 DIAGNOSIS — Z8673 Personal history of transient ischemic attack (TIA), and cerebral infarction without residual deficits: Secondary | ICD-10-CM | POA: Insufficient documentation

## 2011-10-15 MED ORDER — FENTANYL CITRATE 0.05 MG/ML IJ SOLN
INTRAMUSCULAR | Status: AC
  Filled 2011-10-15: qty 6

## 2011-10-15 MED ORDER — SODIUM CHLORIDE 0.9 % IV SOLN
INTRAVENOUS | Status: DC
  Administered 2011-10-15: 15:00:00 via INTRAVENOUS

## 2011-10-15 MED ORDER — VANCOMYCIN HCL IN DEXTROSE 1-5 GM/200ML-% IV SOLN
INTRAVENOUS | Status: AC
  Filled 2011-10-15: qty 200

## 2011-10-15 MED ORDER — VANCOMYCIN HCL IN DEXTROSE 1-5 GM/200ML-% IV SOLN
1000.0000 mg | Freq: Once | INTRAVENOUS | Status: AC
  Administered 2011-10-15: 1000 mg via INTRAVENOUS

## 2011-10-15 MED ORDER — HEPARIN SOD (PORK) LOCK FLUSH 100 UNIT/ML IV SOLN
INTRAVENOUS | Status: AC | PRN
  Administered 2011-10-15: 500 [IU]

## 2011-10-15 MED ORDER — FENTANYL CITRATE 0.05 MG/ML IJ SOLN
INTRAMUSCULAR | Status: AC | PRN
  Administered 2011-10-15: 100 ug via INTRAVENOUS

## 2011-10-15 MED ORDER — MIDAZOLAM HCL 5 MG/5ML IJ SOLN
INTRAMUSCULAR | Status: AC | PRN
  Administered 2011-10-15: 1 mg via INTRAVENOUS

## 2011-10-15 MED ORDER — MIDAZOLAM HCL 2 MG/2ML IJ SOLN
INTRAMUSCULAR | Status: AC
  Filled 2011-10-15: qty 6

## 2011-10-15 MED ORDER — LIDOCAINE HCL 1 % IJ SOLN
INTRAMUSCULAR | Status: AC
  Filled 2011-10-15: qty 20

## 2011-10-15 NOTE — H&P (Signed)
Chief Complaint: Metastatic lung cancer Referring Physician:Mohamed HPI: Austin [name] is an 78 y.o. male who is referred and scheduled for portacath placement to receive chemotherapy.  He denies any other recent complaint or illness.  Past Medical History:  Past Medical History  Diagnosis Date  . Stroke     1980S  . Shortness of breath     WITH COUGH  . Blood transfusion   . Cancer 06/2011    lung    Past Surgical History:  Past Surgical History  Procedure Date  . Cholecystectomy   . Cardiovascular stress test     UNSURE OF DATE  ([facility])    Family History:  Family History  Problem Relation Age of Onset  . Anesthesia problems Neg Hx   . Hypotension Neg Hx   . Malignant hyperthermia Neg Hx   . Pseudochol deficiency Neg Hx     Social History:  reports that he has been smoking Cigarettes.  He has a 25 pack-year smoking history. He has never used smokeless tobacco. He reports that he does not drink alcohol or use illicit drugs, though he has a remote history of EtOH use.  Allergies:  Allergies  Allergen Reactions  . Penicillins Swelling    Medications: aspirin 81 MG tablet (Taking) Sig - Route: Take 81 mg by mouth daily. - Oral Class: Historical Med Number of times this order has been changed since signing: 1 Order [address] Chlorphen-Phenyleph-ASA (ALKA-SELTZER PLUS COLD PO) (Taking) Sig - Route: Take 1 tablet by mouth as needed. For cold symptoms - Oral Class: Historical Med Number of times this order has been changed since signing: 2 Order [address] gabapentin (NEURONTIN) 300 MG capsule (Taking) Sig - Route: Take 300 mg by mouth daily. - Oral Class: Historical Med Number of times this order has been changed since signing: 2 Order [address] lidocaine-prilocaine (EMLA) cream 30 g 0 10/09/2011 10/08/2012 Sig - Route: Apply topically as needed. - Topical Number of times this order has been changed since signing: 1 Order [address] prochlorperazine (COMPAZINE) 10  MG tablet 60 tablet 0 10/08/2011 10/15/2011 Sig - Route: Take 1 tablet (10 mg total) by mouth every 6 (six) hours as needed. - Oral   Please HPI for pertinent positives, otherwise complete 10 system ROS negative.  Physical Exam: Blood pressure 135/63, pulse 81, temperature 97.5 F (36.4 C), temperature source Oral, resp. rate 16, height 5\' 10"  (1.778 m), weight 177 lb (80.287 kg), SpO2 97.00%. Body mass index is 25.40 kg/(m^2).   General Appearance:  Alert, cooperative, no distress, appears stated age  Head:  Normocephalic, without obvious abnormality, atraumatic  ENT: Unremarkable  Neck: Supple, symmetrical, trachea midline, no adenopathy, thyroid: not enlarged, symmetric, no tenderness/mass/nodules  Lungs:   Clear to auscultation bilaterally, no w/r/r, respirations unlabored without use of accessory muscles.  Chest Wall:  No tenderness or deformity. Small scar over clavicle-pt reports from a fall when he was younger, not a surgical procedure.  Heart:  Regular rate and rhythm, S1, S2 normal, no murmur, rub or gallop. Carotids 2+ without bruit.  Abdomen:   Soft, non-tender, non distended. Bowel sounds active all four quadrants,  no masses, no organomegaly.  Extremities: Extremities normal, atraumatic, no cyanosis or edema  Neurologic: Normal affect, no gross deficits.   No results found for this or any previous visit (from the past 48 hour(s)). No results found.  Assessment/Plan Metastatic non-small cell lung cancer Discussed portacath placement procedure including risks and complications. Labs ok  Consent signed in chart.  Brayton El PA-C 10/15/2011, 1:01 PM

## 2011-10-15 NOTE — Procedures (Signed)
R IJ Port No ptx. No complication No blood loss. See complete dictation in [facility] PACS.  

## 2011-10-15 NOTE — Discharge Instructions (Signed)
Implanted Port Instructions  An implanted port is a central line that has a round shape and is placed under the skin. It is used for long-term IV (intravenous) access for:   Medicine.   Fluids.   Liquid nutrition, such as TPN (total parenteral nutrition).   Blood samples.  Ports can be placed:   In the chest area just below the collarbone (this is the most common place.)   In the arms.   In the belly (abdomen) area.   In the legs.  PARTS OF THE PORT  A port has 2 main parts:   The reservoir. The reservoir is round, disc-shaped, and will be a small, raised area under your skin.   The reservoir is the part where a needle is inserted (accessed) to either give medicines or to draw blood.   The catheter. The catheter is a long, slender tube that extends from the reservoir. The catheter is placed into a large vein.   Medicine that is inserted into the reservoir goes into the catheter and then into the vein.  INSERTION OF THE PORT   The port is surgically placed in either an operating room or in a procedural area (interventional radiology).   Medicine may be given to help you relax during the procedure.   The skin where the port will be inserted is numbed (local anesthetic).   1 or 2 small cuts (incisions) will be made in the skin to insert the port.   The port can be used after it has been inserted.  INCISION SITE CARE   The incision site may have small adhesive strips on it. This helps keep the incision site closed. Sometimes, no adhesive strips are placed. Instead of adhesive strips, a special kind of surgical glue is used to keep the incision closed.   If adhesive strips were placed on the incision sites, do not take them off. They will fall off on their own.   The incision site may be sore for 1 to 2 days. Pain medicine can help.   Do not get the incision site wet. Bathe or shower as directed by your caregiver.   The incision site should heal in 5 to 7 days. A small scar may form after the  incision has healed.  ACCESSING THE PORT  Special steps must be taken to access the port:   Before the port is accessed, a numbing cream can be placed on the skin. This helps numb the skin over the port site.   A sterile technique is used to access the port.   The port is accessed with a needle. Only "non-coring" port needles should be used to access the port. Once the port is accessed, a blood return should be checked. This helps ensure the port is in the vein and is not clogged (clotted).   If your caregiver believes your port should remain accessed, a clear (transparent) bandage will be placed over the needle site. The bandage and needle will need to be changed every week or as directed by your caregiver.   Keep the bandage covering the needle clean and dry. Do not get it wet. Follow your caregiver's instructions on how to take a shower or bath when the port is accessed.   If your port does not need to stay accessed, no bandage is needed over the port.  FLUSHING THE PORT  Flushing the port keeps it from getting clogged. How often the port is flushed depends on:   If a   constant infusion is running. If a constant infusion is running, the port may not need to be flushed.   If intermittent medicines are given.   If the port is not being used.  For intermittent medicines:   The port will need to be flushed:   After medicines have been given.   After blood has been drawn.   As part of routine maintenance.   A port is normally flushed with:   Normal saline.   Heparin.   Follow your caregiver's advice on how often, how much, and the type of flush to use on your port.  IMPORTANT PORT INFORMATION   Tell your caregiver if you are allergic to heparin.   After your port is placed, you will get a manufacturer's information card. The card has information about your port. Keep this card with you at all times.   There are many types of ports available. Know what kind of port you have.   In case of an  emergency, it may be helpful to wear a medical alert bracelet. This can help alert health care workers that you have a port.   The port can stay in for as long as your caregiver believes it is necessary.   When it is time for the port to come out, surgery will be done to remove it. The surgery will be similar to how the port was put in.   If you are in the hospital or clinic:   Your port will be taken care of and flushed by a nurse.   If you are at home:   A home health care nurse may give medicines and take care of the port.   You or a family member can get special training and directions for giving medicine and taking care of the port at home.  SEEK IMMEDIATE MEDICAL CARE IF:    Your port does not flush or you are unable to get a blood return.   New drainage or pus is coming from the incision.   A bad smell is coming from the incision site.   You develop swelling or increased redness at the incision site.   You develop increased swelling or pain at the port site.   You develop swelling or pain in the surrounding skin near the port.   You have an oral temperature above 102 F (38.9 C), not controlled by medicine.  MAKE SURE YOU:    Understand these instructions.   Will watch your condition.   Will get help right away if you are not doing well or get worse.  Document Released: 04/07/2005 Document Revised: 03/27/2011 Document Reviewed: 06/29/2008  [employer] Patient Information 2012 [employer]          Moderate Sedation, Adult  Moderate sedation is given to help you relax or even sleep through a procedure. You may remain sleepy, be clumsy, or have poor balance for several hours following this procedure. Arrange for a responsible adult, family member, or friend to take you home. A responsible adult should stay with you for at least 24 hours or until the medicines have worn off.   Do not participate in any activities where you could become injured for the next 24 hours, or until you feel normal  again. Do not:   Drive.   Swim.   Ride a bicycle.   Operate heavy machinery.   Cook.   Use power tools.   Climb ladders.   Work at heights.   Do not   make important decisions or sign legal documents until you are improved.   Vomiting may occur if you eat too soon. When you can drink without vomiting, try water, juice, or soup. Try solid foods if you feel little or no nausea.   Only take over-the-counter or prescription medications for pain, discomfort, or fever as directed by your caregiver.If pain medications have been prescribed for you, ask your caregiver how soon it is safe to take them.   Make sure you and your family fully understands everything about the medication given to you. Make sure you understand what side effects may occur.   You should not drink alcohol, take sleeping pills, or medications that cause drowsiness for at least 24 hours.   If you smoke, do not smoke alone.   If you are feeling better, you may resume normal activities 24 hours after receiving sedation.   Keep all appointments as scheduled. Follow all instructions.   Ask questions if you do not understand.  SEEK MEDICAL CARE IF:    Your skin is pale or bluish in color.   You continue to feel sick to your stomach (nauseous) or throw up (vomit).   Your pain is getting worse and not helped by medication.   You have bleeding or swelling.   You are still sleepy or feeling clumsy after 24 hours.  SEEK IMMEDIATE MEDICAL CARE IF:    You develop a rash.   You have difficulty breathing.   You develop any type of allergic problem.   You have a fever.  Document Released: 12/31/2000 Document Revised: 03/27/2011 Document Reviewed: 05/24/2007  [employer] Patient Information 2012 [employer].

## 2011-10-17 ENCOUNTER — Ambulatory Visit (HOSPITAL_BASED_OUTPATIENT_CLINIC_OR_DEPARTMENT_OTHER): Payer: Medicare Other

## 2011-10-17 ENCOUNTER — Other Ambulatory Visit: Payer: Self-pay | Admitting: Oncology

## 2011-10-17 ENCOUNTER — Other Ambulatory Visit (HOSPITAL_BASED_OUTPATIENT_CLINIC_OR_DEPARTMENT_OTHER): Payer: Medicare Other

## 2011-10-17 VITALS — BP 118/74 | HR 77 | Temp 97.4°F

## 2011-10-17 DIAGNOSIS — C3430 Malignant neoplasm of lower lobe, unspecified bronchus or lung: Secondary | ICD-10-CM

## 2011-10-17 DIAGNOSIS — Z5111 Encounter for antineoplastic chemotherapy: Secondary | ICD-10-CM

## 2011-10-17 DIAGNOSIS — C7800 Secondary malignant neoplasm of unspecified lung: Secondary | ICD-10-CM

## 2011-10-17 DIAGNOSIS — C3490 Malignant neoplasm of unspecified part of unspecified bronchus or lung: Secondary | ICD-10-CM

## 2011-10-17 LAB — CBC WITH DIFFERENTIAL/PLATELET
Eosinophils Absolute: 0.2 10*3/uL (ref 0.0–0.5)
LYMPH%: 24.4 % (ref 14.0–49.0)
MONO#: 1.3 10*3/uL — ABNORMAL HIGH (ref 0.1–0.9)
NEUT#: 5.5 10*3/uL (ref 1.5–6.5)
Platelets: 207 10*3/uL (ref 140–400)
RBC: 4.77 10*6/uL (ref 4.20–5.82)
RDW: 14.8 % — ABNORMAL HIGH (ref 11.0–14.6)
WBC: 9.4 10*3/uL (ref 4.0–10.3)
lymph#: 2.3 10*3/uL (ref 0.9–3.3)

## 2011-10-17 LAB — COMPREHENSIVE METABOLIC PANEL
Albumin: 3.9 g/dL (ref 3.5–5.2)
CO2: 26 mEq/L (ref 19–32)
Chloride: 101 mEq/L (ref 96–112)
Glucose, Bld: 94 mg/dL (ref 70–99)
Potassium: 4 mEq/L (ref 3.5–5.3)
Sodium: 136 mEq/L (ref 135–145)
Total Protein: 7.2 g/dL (ref 6.0–8.3)

## 2011-10-17 MED ORDER — SODIUM CHLORIDE 0.9 % IV SOLN
480.0000 mg | Freq: Once | INTRAVENOUS | Status: AC
  Administered 2011-10-17: 480 mg via INTRAVENOUS
  Filled 2011-10-17: qty 48

## 2011-10-17 MED ORDER — ONDANSETRON 16 MG/50ML IVPB (CHCC)
16.0000 mg | Freq: Once | INTRAVENOUS | Status: AC
  Administered 2011-10-17: 16 mg via INTRAVENOUS

## 2011-10-17 MED ORDER — HEPARIN SOD (PORK) LOCK FLUSH 100 UNIT/ML IV SOLN
500.0000 [IU] | Freq: Once | INTRAVENOUS | Status: AC | PRN
  Administered 2011-10-17: 500 [IU]
  Filled 2011-10-17: qty 5

## 2011-10-17 MED ORDER — DEXAMETHASONE SODIUM PHOSPHATE 4 MG/ML IJ SOLN
20.0000 mg | Freq: Once | INTRAMUSCULAR | Status: AC
  Administered 2011-10-17: 20 mg via INTRAVENOUS

## 2011-10-17 MED ORDER — FAMOTIDINE IN NACL 20-0.9 MG/50ML-% IV SOLN
20.0000 mg | Freq: Once | INTRAVENOUS | Status: AC
  Administered 2011-10-17: 20 mg via INTRAVENOUS

## 2011-10-17 MED ORDER — SODIUM CHLORIDE 0.9 % IJ SOLN
10.0000 mL | INTRAMUSCULAR | Status: DC | PRN
  Administered 2011-10-17: 10 mL
  Filled 2011-10-17: qty 10

## 2011-10-17 MED ORDER — PACLITAXEL CHEMO INJECTION 300 MG/50ML
175.0000 mg/m2 | Freq: Once | INTRAVENOUS | Status: AC
  Administered 2011-10-17: 348 mg via INTRAVENOUS
  Filled 2011-10-17: qty 58

## 2011-10-17 MED ORDER — DIPHENHYDRAMINE HCL 50 MG/ML IJ SOLN
50.0000 mg | Freq: Once | INTRAMUSCULAR | Status: AC
  Administered 2011-10-17: 50 mg via INTRAVENOUS

## 2011-10-17 MED ORDER — SODIUM CHLORIDE 0.9 % IV SOLN
Freq: Once | INTRAVENOUS | Status: AC
  Administered 2011-10-17: 11:00:00 via INTRAVENOUS

## 2011-10-17 NOTE — Patient Instructions (Addendum)
[facility] Discharge Instructions for Patients Receiving Chemotherapy  Today you received the following chemotherapy agents Taxol and Carboplatin.  To help prevent nausea and vomiting after your treatment, we encourage you to take your nausea medication.   If you develop nausea and vomiting that is not controlled by your nausea medication, call the clinic. If it is after clinic hours your family physician or the after hours number for the clinic or go to the Emergency Department.   BELOW ARE SYMPTOMS THAT SHOULD BE REPORTED IMMEDIATELY:  *FEVER GREATER THAN 100.5 F  *CHILLS WITH OR WITHOUT FEVER  NAUSEA AND VOMITING THAT IS NOT CONTROLLED WITH YOUR NAUSEA MEDICATION  *UNUSUAL SHORTNESS OF BREATH  *UNUSUAL BRUISING OR BLEEDING  TENDERNESS IN MOUTH AND THROAT WITH OR WITHOUT PRESENCE OF ULCERS  *URINARY PROBLEMS  *BOWEL PROBLEMS  UNUSUAL RASH Items with * indicate a potential emergency and should be followed up as soon as possible.  One of the nurses will contact you 24 hours after your treatment. Please let the nurse know about any problems that you may have experienced. Feel free to call the clinic you have any questions or concerns. The clinic phone number is [phone].   I have been informed and understand all the instructions given to me. I know to contact the clinic, my physician, or go to the Emergency Department if any problems should occur. I do not have any questions at this time, but understand that I may call the clinic during office hours   should I have any questions or need assistance in obtaining follow up care.    __________________________________________  _____________  __________ Signature of Patient or Authorized Representative            Date                   Time    __________________________________________ Nurse's Signature    

## 2011-10-18 ENCOUNTER — Ambulatory Visit (HOSPITAL_BASED_OUTPATIENT_CLINIC_OR_DEPARTMENT_OTHER): Payer: Medicare Other

## 2011-10-18 VITALS — BP 105/64 | HR 102

## 2011-10-18 DIAGNOSIS — C7800 Secondary malignant neoplasm of unspecified lung: Secondary | ICD-10-CM

## 2011-10-18 DIAGNOSIS — C3490 Malignant neoplasm of unspecified part of unspecified bronchus or lung: Secondary | ICD-10-CM

## 2011-10-18 DIAGNOSIS — Z5189 Encounter for other specified aftercare: Secondary | ICD-10-CM

## 2011-10-18 DIAGNOSIS — C3430 Malignant neoplasm of lower lobe, unspecified bronchus or lung: Secondary | ICD-10-CM

## 2011-10-18 MED ORDER — PEGFILGRASTIM INJECTION 6 MG/0.6ML
6.0000 mg | Freq: Once | SUBCUTANEOUS | Status: AC
  Administered 2011-10-18: 6 mg via SUBCUTANEOUS

## 2011-10-18 NOTE — Patient Instructions (Signed)
Call MD for complications. 

## 2011-10-20 ENCOUNTER — Telehealth: Payer: Self-pay

## 2011-10-20 NOTE — Telephone Encounter (Signed)
Mr. Oestreich stated that he has done pretty well after his treatment 10-17-11.  The Claritin has helped with the neulasta aches which have been minimal.  He has been nauseous at times and used the compazine with good effect.  He was constipated which he had resolve today.  Encouraged him to take in ~64 oz of fluid a day.  He is to see Dr. Asa Lente physician assistant  Tomorrow.  He appreciated the phone call.

## 2011-10-21 ENCOUNTER — Other Ambulatory Visit: Payer: Self-pay | Admitting: Certified Registered Nurse Anesthetist

## 2011-10-21 ENCOUNTER — Ambulatory Visit (HOSPITAL_BASED_OUTPATIENT_CLINIC_OR_DEPARTMENT_OTHER): Payer: Medicare Other | Admitting: Physician Assistant

## 2011-10-21 ENCOUNTER — Other Ambulatory Visit (HOSPITAL_BASED_OUTPATIENT_CLINIC_OR_DEPARTMENT_OTHER): Payer: Medicare Other | Admitting: Lab

## 2011-10-21 ENCOUNTER — Encounter: Payer: Self-pay | Admitting: *Deleted

## 2011-10-21 ENCOUNTER — Telehealth: Payer: Self-pay | Admitting: Internal Medicine

## 2011-10-21 ENCOUNTER — Encounter: Payer: Self-pay | Admitting: Physician Assistant

## 2011-10-21 VITALS — BP 122/59 | HR 114 | Temp 98.6°F | Ht 70.0 in | Wt 174.2 lb

## 2011-10-21 DIAGNOSIS — C3490 Malignant neoplasm of unspecified part of unspecified bronchus or lung: Secondary | ICD-10-CM

## 2011-10-21 LAB — CBC WITH DIFFERENTIAL/PLATELET
BASO%: 0.3 % (ref 0.0–2.0)
Eosinophils Absolute: 0.3 10*3/uL (ref 0.0–0.5)
MCHC: 34.2 g/dL (ref 32.0–36.0)
MONO#: 0.2 10*3/uL (ref 0.1–0.9)
MONO%: 1.1 % (ref 0.0–14.0)
NEUT#: 17.3 10*3/uL — ABNORMAL HIGH (ref 1.5–6.5)
RBC: 4.36 10*6/uL (ref 4.20–5.82)
RDW: 15 % — ABNORMAL HIGH (ref 11.0–14.6)
WBC: 19.5 10*3/uL — ABNORMAL HIGH (ref 4.0–10.3)

## 2011-10-21 LAB — COMPREHENSIVE METABOLIC PANEL
ALT: 22 U/L (ref 0–53)
Albumin: 3.4 g/dL — ABNORMAL LOW (ref 3.5–5.2)
Alkaline Phosphatase: 121 U/L — ABNORMAL HIGH (ref 39–117)
CO2: 28 mEq/L (ref 19–32)
Glucose, Bld: 109 mg/dL — ABNORMAL HIGH (ref 70–99)
Potassium: 3.9 mEq/L (ref 3.5–5.3)
Sodium: 132 mEq/L — ABNORMAL LOW (ref 135–145)
Total Protein: 6.9 g/dL (ref 6.0–8.3)

## 2011-10-21 NOTE — Progress Notes (Signed)
Spoke with pt at [facility] today. No questions or concerns at this time.  

## 2011-10-21 NOTE — Telephone Encounter (Signed)
Gave pt appt for July and August 2013

## 2011-10-22 ENCOUNTER — Telehealth: Payer: Self-pay | Admitting: Internal Medicine

## 2011-10-22 NOTE — Telephone Encounter (Signed)
Talked to pt , advised him to get new calendar for August 2013, pt has gotten a calendar for July 2013 yesterday

## 2011-10-22 NOTE — Progress Notes (Signed)
[facility] [phone]   [phone]  OFFICE PROGRESS NOTE  [name], MD [address] [place] [address]  DIAGNOSIS: Metastatic non-small cell lung cancer, squamous cell carcinoma presenting with 2 nodules in the left lung as well as one nodule in the right lung diagnosed in March of 2013.   PRIOR THERAPY: None   CURRENT THERAPY:  systemic chemotherapy with carboplatin for AUC of 5 and paclitaxel 175 mg/M2 every 3 weeks with Neulasta support. Status post 1 cycle  INTERVAL HISTORY: Austin [name] 78 y.o. male returns to the clinic today for a symptom management visit after receiving his first cycle of systemic chemotherapy with carboplatin and paclitaxel with Neulasta support. He did have some pain after that are the Neulasta injection. If the pain and achiness is gradually subsiding. He also has had some constipation which has since resolved. He has some occasional shortness of breath and reports a cough that is productive of white phlegm. He denied having any significant chest pain or shortness breath,or hemoptysis. He has no weight loss or night sweats. He did not have any difficulty with nausea or vomiting.  MEDICAL HISTORY: Past Medical History  Diagnosis Date  . Stroke     1980S  . Shortness of breath     WITH COUGH  . Blood transfusion   . Cancer 06/2011    lung    ALLERGIES:  is allergic to penicillins.  MEDICATIONS:  Current Outpatient Prescriptions  Medication Sig Dispense Refill  . aspirin 81 MG tablet Take 81 mg by mouth daily.      . Chlorphen-Phenyleph-ASA (ALKA-SELTZER PLUS COLD PO) Take 1 tablet by mouth as needed. For cold symptoms      . gabapentin (NEURONTIN) 300 MG capsule Take 300 mg by mouth daily.       Marland Kitchen lidocaine-prilocaine (EMLA) cream Apply topically as needed.  30 g  0  . prochlorperazine (COMPAZINE) 10 MG tablet Take 1 tablet (10 mg total) by mouth every 6 (six) hours as needed.  60 tablet   0    SURGICAL HISTORY:  Past Surgical History  Procedure Date  . Cholecystectomy   . Cardiovascular stress test     UNSURE OF DATE  ([facility])    REVIEW OF SYSTEMS:  Pertinent items are noted in HPI.   PHYSICAL EXAMINATION: General appearance: alert, cooperative and no distress Head: Normocephalic, without obvious abnormality, atraumatic Neck: no adenopathy Lymph nodes: Cervical, supraclavicular, and axillary nodes normal. Resp: clear to auscultation bilaterally Cardio: regular rate and rhythm, S1, S2 normal, no murmur, click, rub or gallop GI: soft, non-tender; bowel sounds normal; no masses,  no organomegaly Extremities: extremities normal, atraumatic, no cyanosis or edema Neurologic: Alert and oriented X 3, normal strength and tone. Normal symmetric reflexes. Normal coordination and gait  ECOG PERFORMANCE STATUS: 0 - Asymptomatic  Blood pressure 122/59, pulse 114, temperature 98.6 F (37 C), temperature source Oral, height 5\' 10"  (1.778 m), weight 174 lb 3.2 oz (79.017 kg).  LABORATORY DATA: Lab Results  Component Value Date   WBC 19.5* 10/21/2011   HGB 14.3 10/21/2011   HCT 41.7 10/21/2011   MCV 95.8 10/21/2011   PLT 72* 10/21/2011      Chemistry      Component Value Date/Time   NA 132* 10/21/2011 1433   K 3.9 10/21/2011 1433   CL 99 10/21/2011 1433   CO2 28 10/21/2011 1433   BUN 13 10/21/2011 1433  CREATININE 0.88 10/21/2011 1433      Component Value Date/Time   CALCIUM 9.2 10/21/2011 1433   ALKPHOS 121* 10/21/2011 1433   AST 25 10/21/2011 1433   ALT 22 10/21/2011 1433   BILITOT 1.5* 10/21/2011 1433       RADIOGRAPHIC STUDIES: No results found.  ASSESSMENT/PLAN: This is a very pleasant 78 years old white male with metastatic non-small cell lung cancer, squamous cell carcinoma. He is now status post one cycle of systemic chemotherapy with carboplatin for AUC of 5 and paclitaxel 175 mg router squared given every 3 weeks with Neulasta support. The patient was discussed with Dr.  Arbutus Ped. He will continue with weekly labs consisting of a CBC differential and C. met. He'll return in 2 weeks prior to cycle #2 with a repeat CBC differential and C. met.  Laural Benes, [name] E, PA-C   All questions were answered. The patient knows to call the clinic with any problems, questions or concerns. We can certainly see the patient much sooner if necessary.  I spent 20 minutes counseling the patient face to face. The total time spent in the appointment was 30 minutes.

## 2011-10-28 ENCOUNTER — Telehealth: Payer: Self-pay | Admitting: Internal Medicine

## 2011-10-28 ENCOUNTER — Other Ambulatory Visit (HOSPITAL_BASED_OUTPATIENT_CLINIC_OR_DEPARTMENT_OTHER): Payer: Medicare Other | Admitting: Lab

## 2011-10-28 DIAGNOSIS — C3490 Malignant neoplasm of unspecified part of unspecified bronchus or lung: Secondary | ICD-10-CM

## 2011-10-28 DIAGNOSIS — C3430 Malignant neoplasm of lower lobe, unspecified bronchus or lung: Secondary | ICD-10-CM

## 2011-10-28 LAB — CBC WITH DIFFERENTIAL/PLATELET
BASO%: 0.9 % (ref 0.0–2.0)
EOS%: 0.1 % (ref 0.0–7.0)
Eosinophils Absolute: 0 10*3/uL (ref 0.0–0.5)
LYMPH%: 10.1 % — ABNORMAL LOW (ref 14.0–49.0)
MCHC: 34.1 g/dL (ref 32.0–36.0)
MCV: 94.4 fL (ref 79.3–98.0)
MONO%: 9.3 % (ref 0.0–14.0)
NEUT#: 20 10*3/uL — ABNORMAL HIGH (ref 1.5–6.5)
RBC: 3.74 10*6/uL — ABNORMAL LOW (ref 4.20–5.82)
RDW: 15.3 % — ABNORMAL HIGH (ref 11.0–14.6)

## 2011-10-28 LAB — COMPREHENSIVE METABOLIC PANEL
ALT: 24 U/L (ref 0–53)
AST: 25 U/L (ref 0–37)
Albumin: 3.6 g/dL (ref 3.5–5.2)
Alkaline Phosphatase: 114 U/L (ref 39–117)
Glucose, Bld: 128 mg/dL — ABNORMAL HIGH (ref 70–99)
Potassium: 4.3 mEq/L (ref 3.5–5.3)
Sodium: 129 mEq/L — ABNORMAL LOW (ref 135–145)
Total Bilirubin: 0.5 mg/dL (ref 0.3–1.2)
Total Protein: 6.1 g/dL (ref 6.0–8.3)

## 2011-10-28 NOTE — Telephone Encounter (Signed)
Pt came in today to get schedule. Lb/chemo and f/u appt scheduled. Added inj's per regimen for 7/19 and 8/9 and gv pt July/August schedule.

## 2011-10-29 ENCOUNTER — Telehealth: Payer: Self-pay | Admitting: *Deleted

## 2011-10-29 NOTE — Telephone Encounter (Signed)
Pt called left msg that he has been constipated and needs guidance on what to do.  Left vm to call back.  SLJ

## 2011-11-04 ENCOUNTER — Other Ambulatory Visit: Payer: Medicare Other | Admitting: Lab

## 2011-11-06 ENCOUNTER — Other Ambulatory Visit (HOSPITAL_BASED_OUTPATIENT_CLINIC_OR_DEPARTMENT_OTHER): Payer: Medicare Other | Admitting: Lab

## 2011-11-06 ENCOUNTER — Ambulatory Visit (HOSPITAL_BASED_OUTPATIENT_CLINIC_OR_DEPARTMENT_OTHER): Payer: Medicare Other | Admitting: Physician Assistant

## 2011-11-06 ENCOUNTER — Telehealth: Payer: Self-pay | Admitting: Internal Medicine

## 2011-11-06 ENCOUNTER — Encounter: Payer: Self-pay | Admitting: Physician Assistant

## 2011-11-06 ENCOUNTER — Ambulatory Visit (HOSPITAL_BASED_OUTPATIENT_CLINIC_OR_DEPARTMENT_OTHER): Payer: Medicare Other

## 2011-11-06 VITALS — BP 143/77 | HR 110 | Temp 97.2°F | Ht 70.0 in | Wt 170.0 lb

## 2011-11-06 DIAGNOSIS — C3430 Malignant neoplasm of lower lobe, unspecified bronchus or lung: Secondary | ICD-10-CM

## 2011-11-06 DIAGNOSIS — G4700 Insomnia, unspecified: Secondary | ICD-10-CM

## 2011-11-06 DIAGNOSIS — Z5111 Encounter for antineoplastic chemotherapy: Secondary | ICD-10-CM

## 2011-11-06 DIAGNOSIS — C3490 Malignant neoplasm of unspecified part of unspecified bronchus or lung: Secondary | ICD-10-CM

## 2011-11-06 LAB — COMPREHENSIVE METABOLIC PANEL
AST: 50 U/L — ABNORMAL HIGH (ref 0–37)
Albumin: 3.4 g/dL — ABNORMAL LOW (ref 3.5–5.2)
Alkaline Phosphatase: 109 U/L (ref 39–117)
BUN: 11 mg/dL (ref 6–23)
Glucose, Bld: 95 mg/dL (ref 70–99)
Potassium: 4.6 mEq/L (ref 3.5–5.3)
Sodium: 127 mEq/L — ABNORMAL LOW (ref 135–145)
Total Bilirubin: 0.9 mg/dL (ref 0.3–1.2)
Total Protein: 6.2 g/dL (ref 6.0–8.3)

## 2011-11-06 LAB — CBC WITH DIFFERENTIAL/PLATELET
EOS%: 0.3 % (ref 0.0–7.0)
Eosinophils Absolute: 0 10*3/uL (ref 0.0–0.5)
LYMPH%: 14.2 % (ref 14.0–49.0)
MCH: 32.1 pg (ref 27.2–33.4)
MCHC: 36.4 g/dL — ABNORMAL HIGH (ref 32.0–36.0)
MCV: 88.4 fL (ref 79.3–98.0)
MONO%: 21.5 % — ABNORMAL HIGH (ref 0.0–14.0)
Platelets: 395 10*3/uL (ref 140–400)
RBC: 3.61 10*6/uL — ABNORMAL LOW (ref 4.20–5.82)
RDW: 14.3 % (ref 11.0–14.6)

## 2011-11-06 MED ORDER — DEXAMETHASONE SODIUM PHOSPHATE 4 MG/ML IJ SOLN
20.0000 mg | Freq: Once | INTRAMUSCULAR | Status: AC
  Administered 2011-11-06: 20 mg via INTRAVENOUS

## 2011-11-06 MED ORDER — DIPHENHYDRAMINE HCL 50 MG/ML IJ SOLN
50.0000 mg | Freq: Once | INTRAMUSCULAR | Status: AC
  Administered 2011-11-06: 50 mg via INTRAVENOUS

## 2011-11-06 MED ORDER — FAMOTIDINE IN NACL 20-0.9 MG/50ML-% IV SOLN
20.0000 mg | Freq: Once | INTRAVENOUS | Status: AC
  Administered 2011-11-06: 20 mg via INTRAVENOUS

## 2011-11-06 MED ORDER — PACLITAXEL CHEMO INJECTION 300 MG/50ML
175.0000 mg/m2 | Freq: Once | INTRAVENOUS | Status: AC
  Administered 2011-11-06: 348 mg via INTRAVENOUS
  Filled 2011-11-06: qty 58

## 2011-11-06 MED ORDER — TEMAZEPAM 15 MG PO CAPS: 15.0000 mg | ORAL_CAPSULE | Freq: Every evening | ORAL | Status: DC | PRN

## 2011-11-06 MED ORDER — CARBOPLATIN CHEMO INJECTION 600 MG/60ML
480.0000 mg | Freq: Once | INTRAVENOUS | Status: AC
  Administered 2011-11-06: 480 mg via INTRAVENOUS
  Filled 2011-11-06: qty 48

## 2011-11-06 MED ORDER — SODIUM CHLORIDE 0.9 % IV SOLN
Freq: Once | INTRAVENOUS | Status: AC
  Administered 2011-11-06: 12:00:00 via INTRAVENOUS

## 2011-11-06 MED ORDER — SODIUM CHLORIDE 0.9 % IJ SOLN
10.0000 mL | INTRAMUSCULAR | Status: DC | PRN
  Administered 2011-11-06: 10 mL
  Filled 2011-11-06: qty 10

## 2011-11-06 MED ORDER — ONDANSETRON 16 MG/50ML IVPB (CHCC)
16.0000 mg | Freq: Once | INTRAVENOUS | Status: AC
  Administered 2011-11-06: 16 mg via INTRAVENOUS

## 2011-11-06 MED ORDER — HEPARIN SOD (PORK) LOCK FLUSH 100 UNIT/ML IV SOLN
500.0000 [IU] | Freq: Once | INTRAVENOUS | Status: AC | PRN
  Administered 2011-11-06: 500 [IU]
  Filled 2011-11-06: qty 5

## 2011-11-06 NOTE — Telephone Encounter (Signed)
Gave pt appt for July and August 2013 lab, ML and chemo °

## 2011-11-07 ENCOUNTER — Ambulatory Visit (HOSPITAL_BASED_OUTPATIENT_CLINIC_OR_DEPARTMENT_OTHER): Payer: Medicare Other

## 2011-11-07 VITALS — BP 137/77 | HR 104 | Temp 96.6°F

## 2011-11-07 DIAGNOSIS — C3490 Malignant neoplasm of unspecified part of unspecified bronchus or lung: Secondary | ICD-10-CM

## 2011-11-07 DIAGNOSIS — C3430 Malignant neoplasm of lower lobe, unspecified bronchus or lung: Secondary | ICD-10-CM

## 2011-11-07 MED ORDER — PEGFILGRASTIM INJECTION 6 MG/0.6ML
6.0000 mg | Freq: Once | SUBCUTANEOUS | Status: AC
  Administered 2011-11-07: 6 mg via SUBCUTANEOUS
  Filled 2011-11-07: qty 0.6

## 2011-11-07 NOTE — Progress Notes (Signed)
[facility] [phone]   [phone]  OFFICE PROGRESS NOTE  [name], MD [address] [place] [address]  DIAGNOSIS: Metastatic non-small cell lung cancer, squamous cell carcinoma presenting with 2 nodules in the left lung as well as one nodule in the right lung diagnosed in March of 2013.   PRIOR THERAPY: None   CURRENT THERAPY:  systemic chemotherapy with carboplatin for AUC of 5 and paclitaxel 175 mg/M2 every 3 weeks with Neulasta support. Status post 1 cycle  INTERVAL HISTORY: Marquis C Hathaway 78 y.o. male returns to the clinic today for a followup visit and to proceed with cycle #2 of his systemic chemotherapy with carboplatin and paclitaxel with Neulasta support. He complains of "cramps" in his legs and feet and states that he is too tired to walk. Is not sleeping very well. He is been trying 8 but is having some difficulty with nausea although his current antiemetics are working. He has had a "crick" in his neck with past 3 days affecting the left side extending into the shoulder and somewhat down the arm but denied any chest pain. He states it is actually getting better.  He also has had some constipation which has since resolved with the help of some prune juice. He has some occasional shortness of breath primarily with exertion.  He denied having any significant chest pain or hemoptysis. He has no weight loss or night sweats.   MEDICAL HISTORY: Past Medical History  Diagnosis Date  . Stroke     1980S  . Shortness of breath     WITH COUGH  . Blood transfusion   . Cancer 06/2011    lung    ALLERGIES:  is allergic to penicillins.  MEDICATIONS:  Current Outpatient Prescriptions  Medication Sig Dispense Refill  . aspirin 81 MG tablet Take 81 mg by mouth daily.      . Chlorphen-Phenyleph-ASA (ALKA-SELTZER PLUS COLD PO) Take 1 tablet by mouth as needed. For cold symptoms      . gabapentin (NEURONTIN) 300 MG capsule Take  300 mg by mouth daily.       Marland Kitchen lidocaine-prilocaine (EMLA) cream Apply topically as needed.  30 g  0  . prochlorperazine (COMPAZINE) 10 MG tablet Take 1 tablet (10 mg total) by mouth every 6 (six) hours as needed.  60 tablet  0  . temazepam (RESTORIL) 15 MG capsule Take 1 capsule (15 mg total) by mouth at bedtime as needed for sleep.  30 capsule  0   No current facility-administered medications for this visit.   Facility-Administered Medications Ordered in Other Visits  Medication Dose Route Frequency Provider Last Rate Last Dose  . 0.9 %  sodium chloride infusion   Intravenous Once Conni Slipper, PA      . CARBOplatin (PARAPLATIN) 480 mg in sodium chloride 0.9 % 250 mL chemo infusion  480 mg Intravenous Once Conni Slipper, PA   480 mg at 11/06/11 1542  . dexamethasone (DECADRON) injection 20 mg  20 mg Intravenous Once Conni Slipper, PA   20 mg at 11/06/11 1219  . diphenhydrAMINE (BENADRYL) injection 50 mg  50 mg Intravenous Once Conni Slipper, PA   50 mg at 11/06/11 1218  . famotidine (PEPCID) IVPB 20 mg  20 mg Intravenous Once Conni Slipper, PA   20 mg at 11/06/11 1148  . heparin lock flush 100 unit/mL  500 Units Intracatheter Once PRN Conni Slipper,  PA   500 Units at 11/06/11 1619  . ondansetron (ZOFRAN) IVPB 16 mg  16 mg Intravenous Once Conni Slipper, PA   16 mg at 11/06/11 1218  . PACLitaxel (TAXOL) 348 mg in dextrose 5 % 500 mL chemo infusion (> 80mg /m2)  175 mg/m2 (Treatment Plan Actual) Intravenous Once Conni Slipper, PA   348 mg at 11/06/11 1236  . DISCONTD: sodium chloride 0.9 % injection 10 mL  10 mL Intracatheter PRN Conni Slipper, PA   10 mL at 11/06/11 1619    SURGICAL HISTORY:  Past Surgical History  Procedure Date  . Cholecystectomy   . Cardiovascular stress test     UNSURE OF DATE  ([facility])    REVIEW OF SYSTEMS:  Pertinent items are noted in HPI.   PHYSICAL EXAMINATION: General appearance: alert, cooperative and no distress Head:  Normocephalic, without obvious abnormality, atraumatic Neck: no adenopathy Lymph nodes: Cervical, supraclavicular, and axillary nodes normal. Resp: clear to auscultation bilaterally Cardio: regular rate and rhythm, S1, S2 normal, no murmur, click, rub or gallop GI: soft, non-tender; bowel sounds normal; no masses,  no organomegaly Extremities: extremities normal, atraumatic, no cyanosis or edema Neurologic: Alert and oriented X 3, normal strength and tone. Normal symmetric reflexes. Normal coordination and gait  ECOG PERFORMANCE STATUS: 0 - Asymptomatic  Blood pressure 143/77, pulse 110, temperature 97.2 F (36.2 C), temperature source Oral, height 5\' 10"  (1.778 m), weight 170 lb (77.111 kg).  LABORATORY DATA: Lab Results  Component Value Date   WBC 15.6* 11/06/2011   HGB 11.6* 11/06/2011   HCT 31.9* 11/06/2011   MCV 88.4 11/06/2011   PLT 395 11/06/2011      Chemistry      Component Value Date/Time   NA 127* 11/06/2011 1000   K 4.6 11/06/2011 1000   CL 93* 11/06/2011 1000   CO2 26 11/06/2011 1000   BUN 11 11/06/2011 1000   CREATININE 0.75 11/06/2011 1000      Component Value Date/Time   CALCIUM 8.6 11/06/2011 1000   ALKPHOS 109 11/06/2011 1000   AST 50* 11/06/2011 1000   ALT 65* 11/06/2011 1000   BILITOT 0.9 11/06/2011 1000       RADIOGRAPHIC STUDIES: No results found.  ASSESSMENT/PLAN: This is a very pleasant 78 years old white male with metastatic non-small cell lung cancer, squamous cell carcinoma. He is now status post one cycle of systemic chemotherapy with carboplatin for AUC of 5 and paclitaxel 175 mg router squared given every 3 weeks with Neulasta support. The patient was discussed with Dr. Arbutus Ped. He will continue with weekly labs consisting of a CBC differential and C. met. He'll return in 3 weeks prior to cycle #3 with a repeat CBC differential and C. met. To address his insomnia he prescription for Restoril 15 mg by mouth at bedtime as needed for sleep total 30 with no  refill was called to his pharmacy of record. His sodium is low at 127 and is likely contributing to his leg complaints. Will ask him to increase his dietary sodium and monitor this closely. He may ultimately need to be placed on demeclocycline. He also reports decreased appetite and we have referred him to our dietitian. He is also given a case of Ensure as he is having some financial difficulties procuring this dietary supplement.  Laural Benes, Breslin Burklow E, PA-C   All questions were answered. The patient knows to call the clinic with any problems, questions or concerns. We can certainly see the  patient much sooner if necessary.  I spent 20 minutes counseling the patient face to face. The total time spent in the appointment was 30 minutes.

## 2011-11-11 ENCOUNTER — Other Ambulatory Visit (HOSPITAL_BASED_OUTPATIENT_CLINIC_OR_DEPARTMENT_OTHER): Payer: Medicare Other | Admitting: Lab

## 2011-11-11 DIAGNOSIS — C3490 Malignant neoplasm of unspecified part of unspecified bronchus or lung: Secondary | ICD-10-CM

## 2011-11-11 LAB — CBC WITH DIFFERENTIAL/PLATELET
Basophils Absolute: 0 10*3/uL (ref 0.0–0.1)
Eosinophils Absolute: 0.1 10*3/uL (ref 0.0–0.5)
HGB: 11.1 g/dL — ABNORMAL LOW (ref 13.0–17.1)
MONO#: 0.1 10*3/uL (ref 0.1–0.9)
NEUT#: 5.6 10*3/uL (ref 1.5–6.5)
RDW: 14.9 % — ABNORMAL HIGH (ref 11.0–14.6)
WBC: 6.9 10*3/uL (ref 4.0–10.3)
lymph#: 1 10*3/uL (ref 0.9–3.3)

## 2011-11-11 LAB — COMPREHENSIVE METABOLIC PANEL
Albumin: 3.3 g/dL — ABNORMAL LOW (ref 3.5–5.2)
BUN: 16 mg/dL (ref 6–23)
Calcium: 8.6 mg/dL (ref 8.4–10.5)
Chloride: 99 mEq/L (ref 96–112)
Glucose, Bld: 115 mg/dL — ABNORMAL HIGH (ref 70–99)
Potassium: 4.2 mEq/L (ref 3.5–5.3)

## 2011-11-18 ENCOUNTER — Other Ambulatory Visit (HOSPITAL_BASED_OUTPATIENT_CLINIC_OR_DEPARTMENT_OTHER): Payer: Medicare Other

## 2011-11-18 DIAGNOSIS — C3490 Malignant neoplasm of unspecified part of unspecified bronchus or lung: Secondary | ICD-10-CM

## 2011-11-18 DIAGNOSIS — C3430 Malignant neoplasm of lower lobe, unspecified bronchus or lung: Secondary | ICD-10-CM

## 2011-11-18 LAB — CBC WITH DIFFERENTIAL/PLATELET
Basophils Absolute: 0.2 10*3/uL — ABNORMAL HIGH (ref 0.0–0.1)
EOS%: 0.2 % (ref 0.0–7.0)
Eosinophils Absolute: 0.1 10*3/uL (ref 0.0–0.5)
HGB: 10.3 g/dL — ABNORMAL LOW (ref 13.0–17.1)
MCH: 31.9 pg (ref 27.2–33.4)
NEUT#: 25.1 10*3/uL — ABNORMAL HIGH (ref 1.5–6.5)
RBC: 3.22 10*6/uL — ABNORMAL LOW (ref 4.20–5.82)
RDW: 16.5 % — ABNORMAL HIGH (ref 11.0–14.6)
lymph#: 2.7 10*3/uL (ref 0.9–3.3)

## 2011-11-18 LAB — COMPREHENSIVE METABOLIC PANEL
ALT: 26 U/L (ref 0–53)
AST: 26 U/L (ref 0–37)
Albumin: 3.7 g/dL (ref 3.5–5.2)
BUN: 12 mg/dL (ref 6–23)
Calcium: 8.7 mg/dL (ref 8.4–10.5)
Chloride: 100 mEq/L (ref 96–112)
Potassium: 4.2 mEq/L (ref 3.5–5.3)
Sodium: 133 mEq/L — ABNORMAL LOW (ref 135–145)
Total Protein: 6.3 g/dL (ref 6.0–8.3)

## 2011-11-22 ENCOUNTER — Other Ambulatory Visit: Payer: Self-pay | Admitting: Internal Medicine

## 2011-11-24 ENCOUNTER — Other Ambulatory Visit: Payer: Self-pay | Admitting: Family Medicine

## 2011-11-25 ENCOUNTER — Other Ambulatory Visit: Payer: Medicare Other | Admitting: Lab

## 2011-11-27 ENCOUNTER — Ambulatory Visit: Payer: Medicare Other | Admitting: Nutrition

## 2011-11-27 ENCOUNTER — Ambulatory Visit (HOSPITAL_BASED_OUTPATIENT_CLINIC_OR_DEPARTMENT_OTHER): Payer: Medicare Other

## 2011-11-27 ENCOUNTER — Ambulatory Visit (HOSPITAL_BASED_OUTPATIENT_CLINIC_OR_DEPARTMENT_OTHER): Payer: Medicare Other | Admitting: Physician Assistant

## 2011-11-27 ENCOUNTER — Encounter: Payer: Self-pay | Admitting: Physician Assistant

## 2011-11-27 ENCOUNTER — Other Ambulatory Visit (HOSPITAL_BASED_OUTPATIENT_CLINIC_OR_DEPARTMENT_OTHER): Payer: Medicare Other | Admitting: Lab

## 2011-11-27 VITALS — BP 128/57 | HR 105 | Temp 97.9°F | Resp 20 | Ht 70.0 in | Wt 166.0 lb

## 2011-11-27 DIAGNOSIS — C3490 Malignant neoplasm of unspecified part of unspecified bronchus or lung: Secondary | ICD-10-CM

## 2011-11-27 DIAGNOSIS — R0602 Shortness of breath: Secondary | ICD-10-CM

## 2011-11-27 DIAGNOSIS — C3430 Malignant neoplasm of lower lobe, unspecified bronchus or lung: Secondary | ICD-10-CM

## 2011-11-27 DIAGNOSIS — K5900 Constipation, unspecified: Secondary | ICD-10-CM

## 2011-11-27 DIAGNOSIS — Z5111 Encounter for antineoplastic chemotherapy: Secondary | ICD-10-CM

## 2011-11-27 LAB — CBC WITH DIFFERENTIAL/PLATELET
HGB: 11.2 g/dL — ABNORMAL LOW (ref 13.0–17.1)
LYMPH%: 16.8 % (ref 14.0–49.0)
MCH: 32.5 pg (ref 27.2–33.4)
MCHC: 35.7 g/dL (ref 32.0–36.0)
MCV: 91 fL (ref 79.3–98.0)
NEUT%: 65.5 % (ref 39.0–75.0)
RBC: 3.45 10*6/uL — ABNORMAL LOW (ref 4.20–5.82)
RDW: 16.7 % — ABNORMAL HIGH (ref 11.0–14.6)
lymph#: 2.8 10*3/uL (ref 0.9–3.3)

## 2011-11-27 LAB — COMPREHENSIVE METABOLIC PANEL
ALT: 22 U/L (ref 0–53)
BUN: 10 mg/dL (ref 6–23)
CO2: 25 mEq/L (ref 19–32)
Calcium: 9.1 mg/dL (ref 8.4–10.5)
Chloride: 97 mEq/L (ref 96–112)
Creatinine, Ser: 0.71 mg/dL (ref 0.50–1.35)
Glucose, Bld: 93 mg/dL (ref 70–99)

## 2011-11-27 MED ORDER — DEXAMETHASONE SODIUM PHOSPHATE 4 MG/ML IJ SOLN
20.0000 mg | Freq: Once | INTRAMUSCULAR | Status: AC
  Administered 2011-11-27: 20 mg via INTRAVENOUS

## 2011-11-27 MED ORDER — SODIUM CHLORIDE 0.9 % IJ SOLN
10.0000 mL | INTRAMUSCULAR | Status: DC | PRN
  Administered 2011-11-27: 10 mL
  Filled 2011-11-27: qty 10

## 2011-11-27 MED ORDER — FAMOTIDINE IN NACL 20-0.9 MG/50ML-% IV SOLN
20.0000 mg | Freq: Once | INTRAVENOUS | Status: AC
  Administered 2011-11-27: 20 mg via INTRAVENOUS

## 2011-11-27 MED ORDER — DIPHENHYDRAMINE HCL 50 MG/ML IJ SOLN
50.0000 mg | Freq: Once | INTRAMUSCULAR | Status: AC
  Administered 2011-11-27: 50 mg via INTRAVENOUS

## 2011-11-27 MED ORDER — SODIUM CHLORIDE 0.9 % IV SOLN
Freq: Once | INTRAVENOUS | Status: AC
  Administered 2011-11-27: 10:00:00 via INTRAVENOUS

## 2011-11-27 MED ORDER — SODIUM CHLORIDE 0.9 % IV SOLN
480.0000 mg | Freq: Once | INTRAVENOUS | Status: AC
  Administered 2011-11-27: 480 mg via INTRAVENOUS
  Filled 2011-11-27: qty 48

## 2011-11-27 MED ORDER — PACLITAXEL CHEMO INJECTION 300 MG/50ML
175.0000 mg/m2 | Freq: Once | INTRAVENOUS | Status: AC
  Administered 2011-11-27: 348 mg via INTRAVENOUS
  Filled 2011-11-27: qty 58

## 2011-11-27 MED ORDER — ONDANSETRON 16 MG/50ML IVPB (CHCC)
16.0000 mg | Freq: Once | INTRAVENOUS | Status: AC
  Administered 2011-11-27: 16 mg via INTRAVENOUS

## 2011-11-27 MED ORDER — HEPARIN SOD (PORK) LOCK FLUSH 100 UNIT/ML IV SOLN
500.0000 [IU] | Freq: Once | INTRAVENOUS | Status: AC | PRN
  Administered 2011-11-27: 500 [IU]
  Filled 2011-11-27: qty 5

## 2011-11-27 NOTE — Progress Notes (Signed)
[facility] [phone]   Fax:([phone]  OFFICE PROGRESS NOTE  [name], MD [address] [place] [address] [place] [address]  DIAGNOSIS: Metastatic non-small cell lung cancer, squamous cell carcinoma presenting with 2 nodules in the left lung as well as one nodule in the right lung diagnosed in March of 2013.   PRIOR THERAPY: None   CURRENT THERAPY:  systemic chemotherapy with carboplatin for AUC of 5 and paclitaxel 175 mg/M2 every 3 weeks with Neulasta support. Status post 2 cycles  INTERVAL HISTORY: Austin [name] 78 y.o. male returns to the clinic today for a followup visit and to proceed with cycle #3 of his systemic chemotherapy with carboplatin and paclitaxel with Neulasta support. Overall is tolerating his systemic chemotherapy with carboplatin paclitaxel Neulasta support relatively well. At heart I sees at its sodium to his diet and is tolerating this as well.he does report that a few days after chemotherapy he does feel weak and "down" and a short tempered during this time. This resolves and he is back at his baseline. He does have occasional episodes of constipation which she manages well with drinking prune juice or eating prunes. He does note some shortness of breath with exertion. Denies any fever or chills but occasionally has a cough productive of a small amount of yellow phlegm.   He denied having any significant chest pain or hemoptysis. He has no weight loss or night sweats. He reports the prescription for Restoril has helped with his sleeping problems.  MEDICAL HISTORY: Past Medical History  Diagnosis Date  . Stroke     1980S  . Shortness of breath     WITH COUGH  . Blood transfusion   . Cancer 06/2011    lung    ALLERGIES:  is allergic to penicillins.  MEDICATIONS:  Current Outpatient Prescriptions  Medication Sig Dispense Refill  . aspirin 81 MG tablet Take 81 mg by mouth daily.      . Chlorphen-Phenyleph-ASA  (ALKA-SELTZER PLUS COLD PO) Take 1 tablet by mouth as needed. For cold symptoms      . gabapentin (NEURONTIN) 300 MG capsule Take 300 mg by mouth daily.       Marland Kitchen lidocaine-prilocaine (EMLA) cream Apply topically as needed.  30 g  0  . prochlorperazine (COMPAZINE) 10 MG tablet TAKE 1 TABLET BY MOUTH EVERY 6 HOURS AS NEEDED  60 tablet  0  . temazepam (RESTORIL) 15 MG capsule Take 1 capsule (15 mg total) by mouth at bedtime as needed for sleep.  30 capsule  0   No current facility-administered medications for this visit.   Facility-Administered Medications Ordered in Other Visits  Medication Dose Route Frequency Provider Last Rate Last Dose  . 0.9 %  sodium chloride infusion   Intravenous Once Conni Slipper, PA 20 mL/hr at 11/27/11 1009    . CARBOplatin (PARAPLATIN) 480 mg in sodium chloride 0.9 % 250 mL chemo infusion  480 mg Intravenous Once Conni Slipper, PA      . dexamethasone (DECADRON) injection 20 mg  20 mg Intravenous Once Conni Slipper, PA   20 mg at 11/27/11 1009  . diphenhydrAMINE (BENADRYL) injection 50 mg  50 mg Intravenous Once Conni Slipper, PA   50 mg at 11/27/11 1009  . famotidine (PEPCID) IVPB 20 mg  20 mg Intravenous Once Conni Slipper, PA   20 mg at 11/27/11 1026  . heparin lock flush 100 unit/mL  500 Units  Intracatheter Once PRN Conni Slipper, PA      . ondansetron (ZOFRAN) IVPB 16 mg  16 mg Intravenous Once Conni Slipper, PA   16 mg at 11/27/11 1009  . PACLitaxel (TAXOL) 348 mg in dextrose 5 % 500 mL chemo infusion (> 80mg /m2)  175 mg/m2 (Treatment Plan Actual) Intravenous Once Conni Slipper, PA 186 mL/hr at 11/27/11 1041 348 mg at 11/27/11 1041  . sodium chloride 0.9 % injection 10 mL  10 mL Intracatheter PRN Conni Slipper, PA        SURGICAL HISTORY:  Past Surgical History  Procedure Date  . Cholecystectomy   . Cardiovascular stress test     UNSURE OF DATE  ([facility])    REVIEW OF SYSTEMS:  Pertinent items are noted in HPI.    PHYSICAL EXAMINATION: General appearance: alert, cooperative and no distress Head: Normocephalic, without obvious abnormality, atraumatic Neck: no adenopathy Lymph nodes: Cervical, supraclavicular, and axillary nodes normal. Resp: clear to auscultation bilaterally Cardio: regular rate and rhythm, S1, S2 normal, no murmur, click, rub or gallop GI: soft, non-tender; bowel sounds normal; no masses,  no organomegaly Extremities: extremities normal, atraumatic, no cyanosis or edema Neurologic: Alert and oriented X 3, normal strength and tone. Normal symmetric reflexes. Normal coordination and gait  ECOG PERFORMANCE STATUS: 0 - Asymptomatic  Blood pressure 128/57, pulse 105, temperature 97.9 F (36.6 C), temperature source Oral, resp. rate 20, height 5\' 10"  (1.778 m), weight 166 lb (75.297 kg).  LABORATORY DATA: Lab Results  Component Value Date   WBC 16.5* 11/27/2011   HGB 11.2* 11/27/2011   HCT 31.4* 11/27/2011   MCV 91.0 11/27/2011   PLT 324 11/27/2011      Chemistry      Component Value Date/Time   NA 133* 11/18/2011 1303   K 4.2 11/18/2011 1303   CL 100 11/18/2011 1303   CO2 24 11/18/2011 1303   BUN 12 11/18/2011 1303   CREATININE 0.82 11/18/2011 1303      Component Value Date/Time   CALCIUM 8.7 11/18/2011 1303   ALKPHOS 179* 11/18/2011 1303   AST 26 11/18/2011 1303   ALT 26 11/18/2011 1303   BILITOT 0.6 11/18/2011 1303       RADIOGRAPHIC STUDIES: No results found.  ASSESSMENT/PLAN: This is a very pleasant 78 years old white male with metastatic non-small cell lung cancer, squamous cell carcinoma. He is now status post one cycle of systemic chemotherapy with carboplatin for AUC of 5 and paclitaxel 175 mg router squared given every 3 weeks with Neulasta support. The patient was discussed with Dr.Ha in Dr. Arbutus Ped absence. He'll proceed with cycle #3 of his systemic chemotherapy with carboplatin for an AUC of 5 and paclitaxel 175 mg per meter squared given every 3 weeks with Neulasta  support. He will continue with weekly labs consisting of a CBC differential and C. met. He'll followup with Dr. Arbutus Ped in 3 weeks with repeat CBC differential and C. met as well as a CT of the chest abdomen and pelvis with contrast to reevaluate his disease. We will continue to monitor his sodium level carefully.   Laural Benes, Bradford Cazier E, PA-C   All questions were answered. The patient knows to call the clinic with any problems, questions or concerns. We can certainly see the patient much sooner if necessary.  I spent 20 minutes counseling the patient face to face. The total time spent in the appointment was 30 minutes.

## 2011-11-27 NOTE — Assessment & Plan Note (Signed)
MEDICAL DIAGNOSIS:  Mr. Austin [name] is a 78 year old male patient of Dr. Asa Lente diagnosed with metastatic lung cancer in March.  He is receiving systemic chemotherapy.  MEDICAL HISTORY INCLUDES:  Stroke, shortness of breath, and blood transfusions.  MEDICATIONS INCLUDE:  Compazine and Restoril.  LABS:  Sodium of 133 and glucose of 100 July 30th.  HEIGHT:  70 inches. WEIGHT:  166 pounds documented 11/27/2011. USUAL BODY WEIGHT:  177 pounds documented June 26. BMI:  23.82  REASON FOR ASSESSMENT:  The patient reports poor appetite.  He is trying to eat small amounts throughout the day.  He drinks Ensure Plus occasionally.  He has history of problems with constipation, which are resolved when he eats prunes and drinks prune juice.   NUTRITION DIAGNOSIS:  Unintended weight loss related to diagnosis of metastatic lung cancer and associated treatments as evidenced by 6% weight loss in approximately 6 weeks.    The patient meets criteria for  moderate malnutrition in the context of chronic illness secondary to 5% weight loss in 1 month and less than 75% of estimated energyrequirements for greater than 1 month.  INTERVENTION:  I have educated [name] on the importance of increasing calories and protein to meet minimum needs to prevent further weight loss.  I have educated him on which foods he should try to incorporate with meals and between meals.  I have encouraged him to increase Ensure Plus to b.i.d. between meals.  I provided him with fact sheets to take with him today along with my contact information as well as some coupons for him.  The patient was appreciative.   Patient received a case of Ensure Plus on October 08, 2011.  MONITORING/EVALUATION (GOALS):  The patient will tolerate increased oral intake to minimize weight loss.   NEXT VISIT:  Thursday, August 29, during chemotherapy.   ______________________________ Zenovia Jarred, RD, CSO, LDN Clinical Nutrition Specialist BN/MEDQ  D:   11/27/2011  T:  11/27/2011  Job:  [id number]

## 2011-11-27 NOTE — Patient Instructions (Addendum)
[facility] Discharge Instructions for Patients Receiving Chemotherapy  Today you received the following chemotherapy agents TAXOL/ CARBOPLATIN  To help prevent nausea and vomiting after your treatment, we encourage you to take your nausea medication  and take it as often as prescribed.   If you develop nausea and vomiting that is not controlled by your nausea medication, call the clinic. If it is after clinic hours your family physician or the after hours number for the clinic or go to the Emergency Department.   BELOW ARE SYMPTOMS THAT SHOULD BE REPORTED IMMEDIATELY:  *FEVER GREATER THAN 100.5 F  *CHILLS WITH OR WITHOUT FEVER  NAUSEA AND VOMITING THAT IS NOT CONTROLLED WITH YOUR NAUSEA MEDICATION  *UNUSUAL SHORTNESS OF BREATH  *UNUSUAL BRUISING OR BLEEDING  TENDERNESS IN MOUTH AND THROAT WITH OR WITHOUT PRESENCE OF ULCERS  *URINARY PROBLEMS  *BOWEL PROBLEMS  UNUSUAL RASH Items with * indicate a potential emergency and should be followed up as soon as possible.  One of the nurses will contact you 24 hours after your treatment. Please let the nurse know about any problems that you may have experienced. Feel free to call the clinic you have any questions or concerns. The clinic phone number is [phone].   I have been informed and understand all the instructions given to me. I know to contact the clinic, my physician, or go to the Emergency Department if any problems should occur. I do not have any questions at this time, but understand that I may call the clinic during office hours   should I have any questions or need assistance in obtaining follow up care.    __________________________________________  _____________  __________ Signature of Patient or Authorized Representative            Date                   Time    __________________________________________ Nurse's Signature    

## 2011-11-28 ENCOUNTER — Ambulatory Visit (HOSPITAL_BASED_OUTPATIENT_CLINIC_OR_DEPARTMENT_OTHER): Payer: Medicare Other

## 2011-11-28 VITALS — BP 129/69 | HR 112 | Temp 96.8°F

## 2011-11-28 DIAGNOSIS — C3490 Malignant neoplasm of unspecified part of unspecified bronchus or lung: Secondary | ICD-10-CM

## 2011-11-28 DIAGNOSIS — C3430 Malignant neoplasm of lower lobe, unspecified bronchus or lung: Secondary | ICD-10-CM

## 2011-11-28 MED ORDER — PEGFILGRASTIM INJECTION 6 MG/0.6ML
6.0000 mg | Freq: Once | SUBCUTANEOUS | Status: AC
  Administered 2011-11-28: 6 mg via SUBCUTANEOUS
  Filled 2011-11-28: qty 0.6

## 2011-11-29 ENCOUNTER — Telehealth: Payer: Self-pay | Admitting: Oncology

## 2011-11-29 NOTE — Telephone Encounter (Signed)
On call:  Called by friend Orson Slick re patient not able to speak over whisper this am. He otherwise seems unchanged, some NP cough, no increased SOB, no chest pain, no fever and no esophagitis symptoms per friend. Is drinking coffee without difficulty. Is scheduled for CT chest upcoming. Told them to call back over weekend if other problems otherwise to let office know situation on Mon in case CT can be done earlier. They were in agreement with this plan, understand he can be evaluated in ED over weekend if needed.  L.Tanae Petrosky MD

## 2011-12-02 ENCOUNTER — Other Ambulatory Visit (HOSPITAL_BASED_OUTPATIENT_CLINIC_OR_DEPARTMENT_OTHER): Payer: Medicare Other

## 2011-12-02 DIAGNOSIS — C3490 Malignant neoplasm of unspecified part of unspecified bronchus or lung: Secondary | ICD-10-CM

## 2011-12-02 LAB — CBC WITH DIFFERENTIAL/PLATELET
BASO%: 0.3 % (ref 0.0–2.0)
EOS%: 0.5 % (ref 0.0–7.0)
HCT: 27.9 % — ABNORMAL LOW (ref 38.4–49.9)
LYMPH%: 6.4 % — ABNORMAL LOW (ref 14.0–49.0)
MCH: 33 pg (ref 27.2–33.4)
MCHC: 33.6 g/dL (ref 32.0–36.0)
NEUT%: 91.4 % — ABNORMAL HIGH (ref 39.0–75.0)
RBC: 2.85 10*6/uL — ABNORMAL LOW (ref 4.20–5.82)
lymph#: 1.1 10*3/uL (ref 0.9–3.3)

## 2011-12-02 LAB — COMPREHENSIVE METABOLIC PANEL
ALT: 25 U/L (ref 0–53)
AST: 22 U/L (ref 0–37)
Chloride: 97 mEq/L (ref 96–112)
Creatinine, Ser: 0.81 mg/dL (ref 0.50–1.35)
Sodium: 131 mEq/L — ABNORMAL LOW (ref 135–145)
Total Bilirubin: 0.9 mg/dL (ref 0.3–1.2)

## 2011-12-09 ENCOUNTER — Other Ambulatory Visit (HOSPITAL_BASED_OUTPATIENT_CLINIC_OR_DEPARTMENT_OTHER): Payer: Medicare Other | Admitting: Lab

## 2011-12-09 DIAGNOSIS — C3430 Malignant neoplasm of lower lobe, unspecified bronchus or lung: Secondary | ICD-10-CM

## 2011-12-09 DIAGNOSIS — C3490 Malignant neoplasm of unspecified part of unspecified bronchus or lung: Secondary | ICD-10-CM

## 2011-12-09 LAB — CBC WITH DIFFERENTIAL/PLATELET
BASO%: 0.6 % (ref 0.0–2.0)
EOS%: 0.2 % (ref 0.0–7.0)
HCT: 28.8 % — ABNORMAL LOW (ref 38.4–49.9)
LYMPH%: 8.3 % — ABNORMAL LOW (ref 14.0–49.0)
MCH: 33.2 pg (ref 27.2–33.4)
MCHC: 33.3 g/dL (ref 32.0–36.0)
MONO%: 9 % (ref 0.0–14.0)
NEUT%: 81.9 % — ABNORMAL HIGH (ref 39.0–75.0)
Platelets: 176 10*3/uL (ref 140–400)

## 2011-12-09 LAB — COMPREHENSIVE METABOLIC PANEL
ALT: 18 U/L (ref 0–53)
AST: 20 U/L (ref 0–37)
Creatinine, Ser: 0.84 mg/dL (ref 0.50–1.35)
Total Bilirubin: 0.4 mg/dL (ref 0.3–1.2)

## 2011-12-11 ENCOUNTER — Telehealth: Payer: Self-pay | Admitting: Internal Medicine

## 2011-12-11 NOTE — Telephone Encounter (Signed)
Called pt and left message regarding appt in August 2013 lab, chemo and ML

## 2011-12-15 ENCOUNTER — Ambulatory Visit (HOSPITAL_COMMUNITY)
Admission: RE | Admit: 2011-12-15 | Discharge: 2011-12-15 | Disposition: A | Discharge status: Home / Self Care | Payer: Medicare Other | Source: Ambulatory Visit | Attending: Physician Assistant | Admitting: Physician Assistant

## 2011-12-15 DIAGNOSIS — C3490 Malignant neoplasm of unspecified part of unspecified bronchus or lung: Secondary | ICD-10-CM

## 2011-12-15 DIAGNOSIS — K8689 Other specified diseases of pancreas: Secondary | ICD-10-CM | POA: Insufficient documentation

## 2011-12-15 DIAGNOSIS — Q619 Cystic kidney disease, unspecified: Secondary | ICD-10-CM | POA: Insufficient documentation

## 2011-12-15 DIAGNOSIS — N400 Enlarged prostate without lower urinary tract symptoms: Secondary | ICD-10-CM | POA: Insufficient documentation

## 2011-12-15 DIAGNOSIS — R911 Solitary pulmonary nodule: Secondary | ICD-10-CM | POA: Insufficient documentation

## 2011-12-15 MED ORDER — IOHEXOL 300 MG/ML  SOLN
100.0000 mL | Freq: Once | INTRAMUSCULAR | Status: AC | PRN
  Administered 2011-12-15: 100 mL via INTRAVENOUS

## 2011-12-16 ENCOUNTER — Other Ambulatory Visit: Payer: Medicare Other | Admitting: Lab

## 2011-12-17 ENCOUNTER — Other Ambulatory Visit: Payer: Self-pay | Admitting: Internal Medicine

## 2011-12-17 NOTE — Progress Notes (Signed)
Scan ok. Will proceed with cycle # 4 of chemotherapy.

## 2011-12-18 ENCOUNTER — Other Ambulatory Visit: Payer: Self-pay | Admitting: Oncology

## 2011-12-18 ENCOUNTER — Ambulatory Visit (HOSPITAL_BASED_OUTPATIENT_CLINIC_OR_DEPARTMENT_OTHER): Payer: Medicare Other | Admitting: Physician Assistant

## 2011-12-18 ENCOUNTER — Other Ambulatory Visit (HOSPITAL_BASED_OUTPATIENT_CLINIC_OR_DEPARTMENT_OTHER): Payer: Medicare Other | Admitting: Lab

## 2011-12-18 ENCOUNTER — Encounter: Payer: Self-pay | Admitting: Physician Assistant

## 2011-12-18 ENCOUNTER — Telehealth: Payer: Self-pay | Admitting: Internal Medicine

## 2011-12-18 ENCOUNTER — Ambulatory Visit: Payer: Medicare Other | Admitting: Nutrition

## 2011-12-18 ENCOUNTER — Ambulatory Visit: Payer: Medicare Other

## 2011-12-18 VITALS — BP 125/74 | HR 106 | Temp 96.8°F | Resp 20 | Ht 70.0 in | Wt 164.9 lb

## 2011-12-18 DIAGNOSIS — C3430 Malignant neoplasm of lower lobe, unspecified bronchus or lung: Secondary | ICD-10-CM

## 2011-12-18 DIAGNOSIS — C3490 Malignant neoplasm of unspecified part of unspecified bronchus or lung: Secondary | ICD-10-CM

## 2011-12-18 LAB — CBC WITH DIFFERENTIAL/PLATELET
BASO%: 1 % (ref 0.0–2.0)
EOS%: 0.6 % (ref 0.0–7.0)
HCT: 29 % — ABNORMAL LOW (ref 38.4–49.9)
MCH: 32.7 pg (ref 27.2–33.4)
MCHC: 34.5 g/dL (ref 32.0–36.0)
MONO#: 2 10*3/uL — ABNORMAL HIGH (ref 0.1–0.9)
NEUT%: 63.8 % (ref 39.0–75.0)
RBC: 3.06 10*6/uL — ABNORMAL LOW (ref 4.20–5.82)
RDW: 18.1 % — ABNORMAL HIGH (ref 11.0–14.6)
WBC: 11.5 10*3/uL — ABNORMAL HIGH (ref 4.0–10.3)
lymph#: 2 10*3/uL (ref 0.9–3.3)
nRBC: 0 % (ref 0–0)

## 2011-12-18 MED ORDER — LORAZEPAM 0.5 MG PO TABS: 0.5000 mg | ORAL_TABLET | Freq: Three times a day (TID) | ORAL | Status: DC | PRN

## 2011-12-18 NOTE — Progress Notes (Signed)
Austin [name] chemotherapy was being held today.  He reports he is weak but he feels like he has gained back some of the weight he has lost.  His weight was documented as 164.9 pounds today, which is decreased from 166 pounds on August 8, and 177 pounds June 26.  The patient reports he is trying to eat.  He states he is no longer smoking. He is drinking some Ensure Plus, although I do not think that he has been drinking very many of these.  NUTRITION DIAGNOSIS:  Unintended weight loss has continued.  INTERVENTION:  I have asked Austin [name] to increase Ensure Plus to b.i.d. to t.i.d. between meals to provide additional calories and protein for weight gain.  I will leave the patient's 2nd complimentary case of Ensure Plus for him to pick up tomorrow when he comes in for treatment.  The patient verbalizes understanding and willingness to comply.  MONITORING/EVALUATION/GOALS:  The patient will tolerate Ensure Plus b.i.d. to t.i.d.  to minimize further weight loss.  NEXT VISIT:  Thursday, September 19th, during chemotherapy.   ______________________________ Zenovia Jarred, RD, CSO, LDN Clinical Nutrition Specialist BN/MEDQ  D:  12/18/2011  T:  12/18/2011  Job:  [id number]

## 2011-12-18 NOTE — Telephone Encounter (Signed)
gve the pt his aug,sept 2013 appt calendar °

## 2011-12-18 NOTE — Patient Instructions (Addendum)
Stop taking the Restoril. You may take Ativan 0.5 mg by mouth at bedtime as well as once or twice during the day as needed for agitation or anxiety Follow up with Dr. Arbutus Ped 01/08/2012 to discuss proceeding with chemotherapy Return tomorrow to receive one liter of IV fluids

## 2011-12-19 ENCOUNTER — Ambulatory Visit (HOSPITAL_BASED_OUTPATIENT_CLINIC_OR_DEPARTMENT_OTHER): Payer: Medicare Other

## 2011-12-19 VITALS — BP 157/64 | HR 121 | Temp 98.8°F

## 2011-12-19 DIAGNOSIS — R5383 Other fatigue: Secondary | ICD-10-CM

## 2011-12-19 DIAGNOSIS — C3490 Malignant neoplasm of unspecified part of unspecified bronchus or lung: Secondary | ICD-10-CM

## 2011-12-19 DIAGNOSIS — C3430 Malignant neoplasm of lower lobe, unspecified bronchus or lung: Secondary | ICD-10-CM

## 2011-12-19 MED ORDER — SODIUM CHLORIDE 0.9 % IV SOLN
INTRAVENOUS | Status: DC
  Administered 2011-12-19: 16:00:00 via INTRAVENOUS

## 2011-12-19 NOTE — Patient Instructions (Signed)
Dehydration, Adult Dehydration is when you lose more fluids from the body than you take in. Vital organs like the kidneys, brain, and heart cannot function without a proper amount of fluids and salt. Any loss of fluids from the body can cause dehydration.  CAUSES   Vomiting.   Diarrhea.   Excessive sweating.   Excessive urine output.   Fever.  SYMPTOMS  Mild dehydration  Thirst.   Dry lips.   Slightly dry mouth.  Moderate dehydration  Very dry mouth.   Sunken eyes.   Skin does not bounce back quickly when lightly pinched and released.   Dark urine and decreased urine production.   Decreased tear production.   Headache.  Severe dehydration  Very dry mouth.   Extreme thirst.   Rapid, weak pulse (more than 100 beats per minute at rest).   Cold hands and feet.   Not able to sweat in spite of heat and temperature.   Rapid breathing.   Blue lips.   Confusion and lethargy.   Difficulty being awakened.   Minimal urine production.   No tears.  DIAGNOSIS  Your caregiver will diagnose dehydration based on your symptoms and your exam. Blood and urine tests will help confirm the diagnosis. The diagnostic evaluation should also identify the cause of dehydration. TREATMENT  Treatment of mild or moderate dehydration can often be done at home by increasing the amount of fluids that you drink. It is best to drink small amounts of fluid more often. Drinking too much at one time can make vomiting worse. Refer to the home care instructions below. Severe dehydration needs to be treated at the hospital where you will probably be given intravenous (IV) fluids that contain water and electrolytes. HOME CARE INSTRUCTIONS   Ask your caregiver about specific rehydration instructions.   Drink enough fluids to keep your urine clear or pale yellow.   Drink small amounts frequently if you have nausea and vomiting.   Eat as you normally do.   Avoid:   Foods or drinks high in  sugar.   Carbonated drinks.   Juice.   Extremely hot or cold fluids.   Drinks with caffeine.   Fatty, greasy foods.   Alcohol.   Tobacco.   Overeating.   Gelatin desserts.   Wash your hands well to avoid spreading bacteria and viruses.   Only take over-the-counter or prescription medicines for pain, discomfort, or fever as directed by your caregiver.   Ask your caregiver if you should continue all prescribed and over-the-counter medicines.   Keep all follow-up appointments with your caregiver.  SEEK MEDICAL CARE IF:  You have abdominal pain and it increases or stays in one area (localizes).   You have a rash, stiff neck, or severe headache.   You are irritable, sleepy, or difficult to awaken.   You are weak, dizzy, or extremely thirsty.  SEEK IMMEDIATE MEDICAL CARE IF:   You are unable to keep fluids down or you get worse despite treatment.   You have frequent episodes of vomiting or diarrhea.   You have blood or green matter (bile) in your vomit.   You have blood in your stool or your stool looks black and tarry.   You have not urinated in 6 to 8 hours, or you have only urinated a small amount of very dark urine.   You have a fever.   You faint.  MAKE SURE YOU:   Understand these instructions.   Will watch your condition.     Will get help right away if you are not doing well or get worse.  Document Released: 04/07/2005 Document Revised: 03/27/2011 Document Reviewed: 11/25/2010 [employer] Patient Information 2012 [employer]. 

## 2011-12-23 ENCOUNTER — Other Ambulatory Visit (HOSPITAL_BASED_OUTPATIENT_CLINIC_OR_DEPARTMENT_OTHER): Payer: Medicare Other | Admitting: Lab

## 2011-12-23 DIAGNOSIS — C3490 Malignant neoplasm of unspecified part of unspecified bronchus or lung: Secondary | ICD-10-CM

## 2011-12-23 LAB — CORRECTED CALCIUM (CC13): Calcium, Corrected: 9.7 mg/dL (ref 8.4–10.4)

## 2011-12-23 LAB — CBC WITH DIFFERENTIAL/PLATELET
Basophils Absolute: 0.1 10*3/uL (ref 0.0–0.1)
Eosinophils Absolute: 0.1 10*3/uL (ref 0.0–0.5)
HGB: 10.6 g/dL — ABNORMAL LOW (ref 13.0–17.1)
MONO#: 1.5 10*3/uL — ABNORMAL HIGH (ref 0.1–0.9)
MONO%: 13.9 % (ref 0.0–14.0)
NEUT#: 7.4 10*3/uL — ABNORMAL HIGH (ref 1.5–6.5)
RBC: 3.12 10*6/uL — ABNORMAL LOW (ref 4.20–5.82)
RDW: 18.7 % — ABNORMAL HIGH (ref 11.0–14.6)
WBC: 11.1 10*3/uL — ABNORMAL HIGH (ref 4.0–10.3)
lymph#: 1.9 10*3/uL (ref 0.9–3.3)

## 2011-12-23 LAB — COMPREHENSIVE METABOLIC PANEL (CC13)
Albumin: 3.1 g/dL — ABNORMAL LOW (ref 3.5–5.0)
Alkaline Phosphatase: 154 U/L — ABNORMAL HIGH (ref 40–150)
BUN: 10 mg/dL (ref 7.0–26.0)
Chloride: 97 mEq/L — ABNORMAL LOW (ref 98–107)
Glucose: 130 mg/dl — ABNORMAL HIGH (ref 70–99)
Potassium: 4 mEq/L (ref 3.5–5.1)
Sodium: 131 mEq/L — ABNORMAL LOW (ref 136–145)
Total Protein: 6.3 g/dL — ABNORMAL LOW (ref 6.4–8.3)

## 2011-12-25 NOTE — Progress Notes (Signed)
[facility] Telephone:(336) [phone]   Fax:(336) [phone]  OFFICE PROGRESS NOTE  [name], MD [address] [place] [address]  DIAGNOSIS: Metastatic non-small cell lung cancer, squamous cell carcinoma presenting with 2 nodules in the left lung as well as one nodule in the right lung diagnosed in March of 2013.   PRIOR THERAPY: None   CURRENT THERAPY:  systemic chemotherapy with carboplatin for AUC of 5 and paclitaxel 175 mg/M2 every 3 weeks with Neulasta support. Status post 3 cycles  INTERVAL HISTORY: Austin [name] 78 y.o. male returns to the clinic today for a followup visit and to proceed with cycle #4 of his systemic chemotherapy with carboplatin and paclitaxel with Neulasta support. He is accompanied by several family members. He complains of having no energy and is now feels he is though he is strong enough to proceed with chemotherapy right now. He is just feeling too weak. He also is very agitated and anxious. Family members noticed the more "small burning" over the past month as well as an increase in his right upper extremity shaking. He does have a history of a prior stroke. He had a restaging CT scan of the chest abdomen pelvis and also presents to discuss the results of that study. O  He denied having any significant chest pain or hemoptysis. He has no weight loss or night sweats. He is not and off several times throughout the day and is up several times at night. The Restoril was initially helping him rest at night the seems to be less effective lately.  MEDICAL HISTORY: Past Medical History  Diagnosis Date  . Stroke     1980S  . Shortness of breath     WITH COUGH  . Blood transfusion   . Cancer 06/2011    lung    ALLERGIES:  is allergic to penicillins.  MEDICATIONS:  Current Outpatient Prescriptions  Medication Sig Dispense Refill  . aspirin 81 MG tablet Take 81 mg by mouth daily.      . Chlorphen-Phenyleph-ASA (ALKA-SELTZER  PLUS COLD PO) Take 1 tablet by mouth as needed. For cold symptoms      . gabapentin (NEURONTIN) 300 MG capsule Take 300 mg by mouth daily.       Marland Kitchen lidocaine-prilocaine (EMLA) cream Apply topically as needed.  30 g  0  . LORazepam (ATIVAN) 0.5 MG tablet Take 1 tablet (0.5 mg total) by mouth every 8 (eight) hours as needed for anxiety (or agitation).  30 tablet  0  . prochlorperazine (COMPAZINE) 10 MG tablet TAKE 1 TABLET BY MOUTH EVERY 6 HOURS AS NEEDED  60 tablet  0  . temazepam (RESTORIL) 15 MG capsule Take 1 capsule (15 mg total) by mouth at bedtime as needed for sleep.  30 capsule  0    SURGICAL HISTORY:  Past Surgical History  Procedure Date  . Cholecystectomy   . Cardiovascular stress test     UNSURE OF DATE  ([facility])    REVIEW OF SYSTEMS:  Pertinent items are noted in HPI.   PHYSICAL EXAMINATION: General appearance: alert, cooperative, no distress and General he appears weaker when compared to his presentation at previous visits. Head: Normocephalic, without obvious abnormality, atraumatic, Some mild intermittent saliva drooling at the right corner of the mouth Neck: no adenopathy Lymph nodes: Cervical, supraclavicular, and axillary nodes normal. Resp: clear to auscultation bilaterally Cardio: regular rate and rhythm, S1, S2 normal, no murmur, click, rub or  gallop GI: soft, non-tender; bowel sounds normal; no masses,  no organomegaly Extremities: extremities normal, atraumatic, no cyanosis or edema Neurologic: Grossly normal  ECOG PERFORMANCE STATUS: 1 - Symptomatic but completely ambulatory  Blood pressure 125/74, pulse 106, temperature 96.8 F (36 C), temperature source Oral, resp. rate 20, height 5\' 10"  (1.778 m), weight 164 lb 14.4 oz (74.798 kg).  LABORATORY DATA: Lab Results  Component Value Date   WBC 11.1* 12/23/2011   HGB 10.6* 12/23/2011   HCT 30.9* 12/23/2011   MCV 99.3* 12/23/2011   PLT 240 12/23/2011      Chemistry      Component Value Date/Time   NA  131* 12/23/2011 1337   NA 132* 12/09/2011 1339   K 4.0 12/23/2011 1337   K 4.2 12/09/2011 1339   CL 97* 12/23/2011 1337   CL 98 12/09/2011 1339   CO2 22 12/23/2011 1337   CO2 27 12/09/2011 1339   BUN 10.0 12/23/2011 1337   BUN 12 12/09/2011 1339   CREATININE 0.8 12/23/2011 1337   CREATININE 0.84 12/09/2011 1339      Component Value Date/Time   CALCIUM 8.9 12/09/2011 1339   ALKPHOS 154* 12/23/2011 1337   ALKPHOS 170* 12/09/2011 1339   AST 19 12/23/2011 1337   AST 20 12/09/2011 1339   ALT 23 12/23/2011 1337   ALT 18 12/09/2011 1339   BILITOT 0.50 12/23/2011 1337   BILITOT 0.4 12/09/2011 1339       RADIOGRAPHIC STUDIES: Ct Chest W Contrast  12/15/2011  *RADIOLOGY REPORT*  Clinical Data:  Lung carcinoma, post chemotherapy.  CT CHEST, ABDOMEN AND PELVIS WITH CONTRAST  Technique:  Multidetector CT imaging of the chest, abdomen and pelvis was performed following the standard protocol during bolus administration of intravenous contrast.  Contrast: OMNIPAQUE IOHEXOL 300 MG/ML  SOLN  Comparison:  PET CT 07/04/2011 and earlier studies  CT CHEST  Findings:  Right IJ port catheter extends to the cavoatrial junction.  Patchy coronary and aortic calcifications are evident. No pleural or pericardial effusion.  Sub centimeter precarinal, pretracheal, and AP window lymph nodes are stable in size.  No hilar adenopathy.  Metallic shot in the right anterior chest wall as before.  19 x 35 mm spiculated mass in the superior segment left lower lobe, previously 23 x 37, now with some central cavitation evident. There is a second spiculated 10 x 13 mm mass in the right suprahilar region adjacent to the major fissure in the left lower lobe (previously 18 x 19 mm). 7 x 11 mm spiculated   subpleural nodule in the superior segment right lower lobe, previously 8.8 mm. No new pulmonary nodule. Thoracic spine and sternum unremarkable.  IMPRESSION:  1.  Interval decrease in size of the 2   left lower lobe superior segment nodules. 2.  Slight  increase in size of the smaller nodule in the superior segment right lower lobe. 3. No new nodule, mass, or adenopathy.  CT ABDOMEN AND PELVIS  Findings:  Calcified granulomas in the liver and spleen.  Vascular clips in the gallbladder fossa.  Unremarkable adrenal glands. Small cyst in the upper pole left kidney.  Moderate aortoiliac arterial calcifications without aneurysm.  Portal vein patent. Atrophic pancreas.  Stomach and small bowel are nondistended. Appendix not discretely identified.  The colon is nondilated, unremarkable.  Urinary bladder is distended.  There is moderate prostatic enlargement with central coarse calcifications.  No ascites.  No free air.  No pelvic or retroperitoneal adenopathy. No hydronephrosis on delayed  scans.  Degenerative disc disease L4- S1.  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  IMPRESSION:  1.  Negative for mass, nodule, or adenopathy. 2.  Additional ancillary findings as above.   Original Report Authenticated By: Osa Craver, M.D.    Ct Abdomen Pelvis W Contrast  12/15/2011  *RADIOLOGY REPORT*  Clinical Data:  Lung carcinoma, post chemotherapy.  CT CHEST, ABDOMEN AND PELVIS WITH CONTRAST  Technique:  Multidetector CT imaging of the chest, abdomen and pelvis was performed following the standard protocol during bolus administration of intravenous contrast.  Contrast: OMNIPAQUE IOHEXOL 300 MG/ML  SOLN  Comparison:  PET CT 07/04/2011 and earlier studies  CT CHEST  Findings:  Right IJ port catheter extends to the cavoatrial junction.  Patchy coronary and aortic calcifications are evident. No pleural or pericardial effusion.  Sub centimeter precarinal, pretracheal, and AP window lymph nodes are stable in size.  No hilar adenopathy.  Metallic shot in the right anterior chest wall as before.  19 x 35 mm spiculated mass in the superior segment left lower lobe, previously 23 x 37, now with some central cavitation evident. There is a second spiculated 10 x 13 mm mass in  the right suprahilar region adjacent to the major fissure in the left lower lobe (previously 18 x 19 mm). 7 x 11 mm spiculated   subpleural nodule in the superior segment right lower lobe, previously 8.8 mm. No new pulmonary nodule. Thoracic spine and sternum unremarkable.  IMPRESSION:  1.  Interval decrease in size of the 2   left lower lobe superior segment nodules. 2.  Slight increase in size of the smaller nodule in the superior segment right lower lobe. 3. No new nodule, mass, or adenopathy.  CT ABDOMEN AND PELVIS  Findings:  Calcified granulomas in the liver and spleen.  Vascular clips in the gallbladder fossa.  Unremarkable adrenal glands. Small cyst in the upper pole left kidney.  Moderate aortoiliac arterial calcifications without aneurysm.  Portal vein patent. Atrophic pancreas.  Stomach and small bowel are nondistended. Appendix not discretely identified.  The colon is nondilated, unremarkable.  Urinary bladder is distended.  There is moderate prostatic enlargement with central coarse calcifications.  No ascites.  No free air.  No pelvic or retroperitoneal adenopathy. No hydronephrosis on delayed scans.  Degenerative disc disease L4- S1.  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  IMPRESSION:  1.  Negative for mass, nodule, or adenopathy. 2.  Additional ancillary findings as above.   Original Report Authenticated By: Osa Craver, M.D.    ASSESSMENT/PLAN: This is a very pleasant 78 years old white male with metastatic non-small cell lung cancer, squamous cell carcinoma. He is now status post 3 cycles of systemic chemotherapy with carboplatin for AUC of 5 and paclitaxel 175 mg router squared given every 3 weeks with Neulasta support. The patient was discussed with Dr.Khan in Dr. Arbutus Ped absence. His restaging CT scans shows relatively stable disease. This was reviewed with Dr. Welton Flakes as well as with the patient and his family. We will postpone cycle #4 of his systemic chemotherapy due to his poor  performance status as well as his request. Will follow up with Dr. Arbutus Ped in 3 weeks with repeat CBC differential and C. met. His CT scan will give him be reviewed and a discussion of proceeding with treatment versus discontinuing treatment and proceeding with hospice/palliative care will be done by Dr. Arbutus Ped.   Austin [name], Austin Puryear E, PA-C   All questions  were answered. The patient knows to call the clinic with any problems, questions or concerns. We can certainly see the patient much sooner if necessary.  I spent 20 minutes counseling the patient face to face. The total time spent in the appointment was 30 minutes.

## 2011-12-29 ENCOUNTER — Other Ambulatory Visit: Payer: Medicare Other | Admitting: Lab

## 2011-12-30 ENCOUNTER — Other Ambulatory Visit (HOSPITAL_BASED_OUTPATIENT_CLINIC_OR_DEPARTMENT_OTHER): Payer: Medicare Other | Admitting: Lab

## 2011-12-30 DIAGNOSIS — C3490 Malignant neoplasm of unspecified part of unspecified bronchus or lung: Secondary | ICD-10-CM

## 2011-12-30 LAB — CBC WITH DIFFERENTIAL/PLATELET
Basophils Absolute: 0.1 10*3/uL (ref 0.0–0.1)
EOS%: 1.9 % (ref 0.0–7.0)
Eosinophils Absolute: 0.2 10*3/uL (ref 0.0–0.5)
HGB: 11.5 g/dL — ABNORMAL LOW (ref 13.0–17.1)
MCH: 34 pg — ABNORMAL HIGH (ref 27.2–33.4)
MCV: 102 fL — ABNORMAL HIGH (ref 79.3–98.0)
MONO%: 13 % (ref 0.0–14.0)
NEUT#: 6 10*3/uL (ref 1.5–6.5)
RBC: 3.39 10*6/uL — ABNORMAL LOW (ref 4.20–5.82)
RDW: 19.6 % — ABNORMAL HIGH (ref 11.0–14.6)
lymph#: 1.8 10*3/uL (ref 0.9–3.3)

## 2011-12-30 LAB — COMPREHENSIVE METABOLIC PANEL (CC13)
ALT: 19 U/L (ref 0–55)
AST: 17 U/L (ref 5–34)
Albumin: 3.2 g/dL — ABNORMAL LOW (ref 3.5–5.0)
Alkaline Phosphatase: 148 U/L (ref 40–150)
Calcium: 8.9 mg/dL (ref 8.4–10.4)
Chloride: 98 mEq/L (ref 98–107)
Potassium: 4 mEq/L (ref 3.5–5.1)
Sodium: 133 mEq/L — ABNORMAL LOW (ref 136–145)
Total Protein: 6.4 g/dL (ref 6.4–8.3)

## 2012-01-08 ENCOUNTER — Ambulatory Visit: Payer: Medicare Other | Admitting: Nutrition

## 2012-01-08 ENCOUNTER — Ambulatory Visit (HOSPITAL_BASED_OUTPATIENT_CLINIC_OR_DEPARTMENT_OTHER): Payer: Medicare Other | Admitting: Internal Medicine

## 2012-01-08 ENCOUNTER — Ambulatory Visit: Payer: Medicare Other

## 2012-01-08 ENCOUNTER — Telehealth: Payer: Self-pay | Admitting: Internal Medicine

## 2012-01-08 ENCOUNTER — Telehealth: Payer: Self-pay | Admitting: *Deleted

## 2012-01-08 ENCOUNTER — Other Ambulatory Visit (HOSPITAL_BASED_OUTPATIENT_CLINIC_OR_DEPARTMENT_OTHER): Payer: Medicare Other | Admitting: Lab

## 2012-01-08 VITALS — BP 139/76 | HR 99 | Temp 97.0°F | Resp 20 | Ht 70.0 in | Wt 163.4 lb

## 2012-01-08 DIAGNOSIS — C3490 Malignant neoplasm of unspecified part of unspecified bronchus or lung: Secondary | ICD-10-CM

## 2012-01-08 DIAGNOSIS — C3430 Malignant neoplasm of lower lobe, unspecified bronchus or lung: Secondary | ICD-10-CM

## 2012-01-08 LAB — CBC WITH DIFFERENTIAL/PLATELET
BASO%: 0.9 % (ref 0.0–2.0)
Basophils Absolute: 0.1 10*3/uL (ref 0.0–0.1)
EOS%: 3.4 % (ref 0.0–7.0)
MCH: 34.3 pg — ABNORMAL HIGH (ref 27.2–33.4)
MCHC: 34.9 g/dL (ref 32.0–36.0)
MCV: 98.4 fL — ABNORMAL HIGH (ref 79.3–98.0)
MONO%: 13.2 % (ref 0.0–14.0)
RBC: 3.67 10*6/uL — ABNORMAL LOW (ref 4.20–5.82)
RDW: 17.3 % — ABNORMAL HIGH (ref 11.0–14.6)
lymph#: 2.8 10*3/uL (ref 0.9–3.3)
nRBC: 0 % (ref 0–0)

## 2012-01-08 NOTE — Telephone Encounter (Signed)
Called pt lvm regarding appt on 10.2 and 10.23

## 2012-01-08 NOTE — Telephone Encounter (Signed)
Printed and added appts....will call pt with inj appts.....sed

## 2012-01-08 NOTE — Progress Notes (Signed)
[facility] Telephone:(336) [phone]   Fax:(336) [phone]  OFFICE PROGRESS NOTE  [name], MD [address] [place] [address]  DIAGNOSIS: Metastatic non-small cell lung cancer, squamous cell carcinoma presenting with 2 nodules in the left lung as well as one nodule in the right lung diagnosed in March of 2013.   PRIOR THERAPY: None   CURRENT THERAPY: systemic chemotherapy with carboplatin for AUC of 5 and paclitaxel 175 mg/M2 every 3 weeks with Neulasta support. Status post 3 cycles  INTERVAL HISTORY: Austin [name] 78 y.o. male returns to the clinic today for routine followup visit accompanied by two family members. The patient is feeling fine today with no specific complaints except for mild fatigue and mild peripheral neuropathy in the lower extremities. He denied having any significant chest pain, shortness breath, cough or hemoptysis. His cycle 4 of chemotherapy was delayed for the last 3 weeks to give the patient an opportunity to recover from the adverse effect especially with significant fatigue and weakness that he had. His CT scan of the chest, abdomen and pelvis showed some improvement of his disease. The patient has no other significant complaints today.  MEDICAL HISTORY: Past Medical History  Diagnosis Date  . Stroke     1980S  . Shortness of breath     WITH COUGH  . Blood transfusion   . Cancer 06/2011    lung    ALLERGIES:  is allergic to penicillins.  MEDICATIONS:  Current Outpatient Prescriptions  Medication Sig Dispense Refill  . aspirin 81 MG tablet Take 81 mg by mouth daily.      Marland Kitchen lidocaine-prilocaine (EMLA) cream Apply topically as needed.  30 g  0  . temazepam (RESTORIL) 15 MG capsule Take 1 capsule (15 mg total) by mouth at bedtime as needed for sleep.  30 capsule  0  . Chlorphen-Phenyleph-ASA (ALKA-SELTZER PLUS COLD PO) Take 1 tablet by mouth as needed. For cold symptoms      . gabapentin (NEURONTIN) 300 MG capsule  Take 300 mg by mouth daily.       Marland Kitchen LORazepam (ATIVAN) 0.5 MG tablet Take 1 tablet (0.5 mg total) by mouth every 8 (eight) hours as needed for anxiety (or agitation).  30 tablet  0  . prochlorperazine (COMPAZINE) 10 MG tablet TAKE 1 TABLET BY MOUTH EVERY 6 HOURS AS NEEDED  60 tablet  0    SURGICAL HISTORY:  Past Surgical History  Procedure Date  . Cholecystectomy   . Cardiovascular stress test     UNSURE OF DATE  ([facility])    REVIEW OF SYSTEMS:  A comprehensive review of systems was negative except for: Constitutional: positive for fatigue   PHYSICAL EXAMINATION: General appearance: alert, cooperative and no distress Head: Normocephalic, without obvious abnormality, atraumatic Neck: no adenopathy Lymph nodes: Cervical, supraclavicular, and axillary nodes normal. Resp: clear to auscultation bilaterally Cardio: regular rate and rhythm, S1, S2 normal, no murmur, click, rub or gallop GI: soft, non-tender; bowel sounds normal; no masses,  no organomegaly Extremities: extremities normal, atraumatic, no cyanosis or edema Neurologic: Alert and oriented X 3, normal strength and tone. Normal symmetric reflexes. Normal coordination and gait  ECOG PERFORMANCE STATUS: 1 - Symptomatic but completely ambulatory  Blood pressure 139/76, pulse 99, temperature 97 F (36.1 C), temperature source Oral, resp. rate 20, height 5\' 10"  (1.778 m), weight 163 lb 6.4 oz (74.118 kg).  LABORATORY DATA: Lab Results  Component Value Date  WBC 9.2 01/08/2012   HGB 12.6* 01/08/2012   HCT 36.1* 01/08/2012   MCV 98.4* 01/08/2012   PLT 262 01/08/2012      Chemistry      Component Value Date/Time   NA 133* 12/30/2011 1313   NA 132* 12/09/2011 1339   K 4.0 12/30/2011 1313   K 4.2 12/09/2011 1339   CL 98 12/30/2011 1313   CL 98 12/09/2011 1339   CO2 25 12/30/2011 1313   CO2 27 12/09/2011 1339   BUN 12.0 12/30/2011 1313   BUN 12 12/09/2011 1339   CREATININE 0.9 12/30/2011 1313   CREATININE 0.84 12/09/2011 1339        Component Value Date/Time   CALCIUM 8.9 12/30/2011 1313   CALCIUM 8.9 12/09/2011 1339   ALKPHOS 148 12/30/2011 1313   ALKPHOS 170* 12/09/2011 1339   AST 17 12/30/2011 1313   AST 20 12/09/2011 1339   ALT 19 12/30/2011 1313   ALT 18 12/09/2011 1339   BILITOT 0.50 12/30/2011 1313   BILITOT 0.4 12/09/2011 1339       RADIOGRAPHIC STUDIES: Ct Chest W Contrast  12/15/2011  *RADIOLOGY REPORT*  Clinical Data:  Lung carcinoma, post chemotherapy.  CT CHEST, ABDOMEN AND PELVIS WITH CONTRAST  Technique:  Multidetector CT imaging of the chest, abdomen and pelvis was performed following the standard protocol during bolus administration of intravenous contrast.  Contrast: OMNIPAQUE IOHEXOL 300 MG/ML  SOLN  Comparison:  PET CT 07/04/2011 and earlier studies   CT CHEST  Findings:  Right IJ port catheter extends to the cavoatrial junction.  Patchy coronary and aortic calcifications are evident. No pleural or pericardial effusion.  Sub centimeter precarinal, pretracheal, and AP window lymph nodes are stable in size.  No hilar adenopathy.  Metallic shot in the right anterior chest wall as before.  19 x 35 mm spiculated mass in the superior segment left lower lobe, previously 23 x 37, now with some central cavitation evident. There is a second spiculated 10 x 13 mm mass in the right suprahilar region adjacent to the major fissure in the left lower lobe (previously 18 x 19 mm). 7 x 11 mm spiculated   subpleural nodule in the superior segment right lower lobe, previously 8.8 mm. No new pulmonary nodule. Thoracic spine and sternum unremarkable.  IMPRESSION:  1.  Interval decrease in size of the 2   left lower lobe superior segment nodules. 2.  Slight increase in size of the smaller nodule in the superior segment right lower lobe. 3. No new nodule, mass, or adenopathy.   CT ABDOMEN AND PELVIS  Findings:  Calcified granulomas in the liver and spleen.  Vascular clips in the gallbladder fossa.  Unremarkable adrenal glands.  Small cyst in the upper pole left kidney.  Moderate aortoiliac arterial calcifications without aneurysm.  Portal vein patent. Atrophic pancreas.  Stomach and small bowel are nondistended. Appendix not discretely identified.  The colon is nondilated, unremarkable.  Urinary bladder is distended.  There is moderate prostatic enlargement with central coarse calcifications.  No ascites.  No free air.  No pelvic or retroperitoneal adenopathy. No hydronephrosis on delayed scans.  Degenerative disc disease L4- S1.  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  IMPRESSION:  1.  Negative for mass, nodule, or adenopathy. 2.  Additional ancillary findings as above.   Original Report Authenticated By: Osa Craver, M.D.    ASSESSMENT: This is a very pleasant 78 years old white male with history of metastatic non-small cell  lung cancer squamous cell carcinoma status post 3 cycles of systemic chemotherapy was carboplatin and paclitaxel with some improvement in his disease. I discussed the scan results with the patient and his family. I recommended for him to resume his treatment with chemotherapy with the same regimen. The patient has some occasional coming next week and he would like to delay his chemotherapy 2 after his family reunion.  PLAN: We'll start the next dose of his chemotherapy on the first week of October. I would also reduced his dose of carboplatin to AUC of 4 and paclitaxel 05/22/1946 MG/M2 every 3 weeks with Neulasta support. The patient would come back for followup visit in 2 weeks with the start of cycle #4. He was advised to call immediately if he has any concerning symptoms in the interval.  All questions were answered. The patient knows to call the clinic with any problems, questions or concerns. We can certainly see the patient much sooner if necessary.  I spent 15 minutes counseling the patient face to face. The total time spent in the appointment was 25 minutes.

## 2012-01-08 NOTE — Patient Instructions (Signed)
Your CT scan showed no evidence for disease progression. We will resume chemotherapy with reduced dose carboplatin and paclitaxel. Followup in 2 weeks

## 2012-01-08 NOTE — Telephone Encounter (Signed)
Per staff message and POF I have scheduled appts.  JMW  

## 2012-01-09 NOTE — Progress Notes (Signed)
I spoke with Austin [name] on the phone today because his chemotherapy was held.  His weight is relatively stable documented as 163.4 pounds today from 164.9 pounds August 29th.  The patient is requesting a 2nd case of Ensure Plus to be provided to him.  He is requesting to pick this up on Friday, September 20th.  NUTRITION DIAGNOSIS:  Nutrition diagnosis of unintended weight loss continues.  INTERVENTION:  I have encouraged Austin [name] to increase oral nutrition supplements to t.i.d. between meals and increase oral intake at mealtime.  I will leave a case of Ensure Plus for the patient to pick up tomorrow.  This will be his 2nd case of Ensure Plus as he did not pick up the one I had left for him on August 30th.  The patient is appreciative.  MONITORING/EVALUATION/GOALS:  The patient will tolerate Ensure t.i.d. to minimize further weight loss.  NEXT VISIT:  Tuesday, October 1st, during chemotherapy.    ______________________________ Zenovia Jarred, RD, CSO, LDN Clinical Nutrition Specialist BN/MEDQ  D:  01/08/2012  T:  01/09/2012  Job:  [id number]

## 2012-01-20 ENCOUNTER — Ambulatory Visit: Payer: Medicare Other | Admitting: Nutrition

## 2012-01-20 ENCOUNTER — Ambulatory Visit (HOSPITAL_BASED_OUTPATIENT_CLINIC_OR_DEPARTMENT_OTHER): Payer: Medicare Other

## 2012-01-20 ENCOUNTER — Other Ambulatory Visit (HOSPITAL_BASED_OUTPATIENT_CLINIC_OR_DEPARTMENT_OTHER): Payer: Medicare Other

## 2012-01-20 ENCOUNTER — Ambulatory Visit (HOSPITAL_BASED_OUTPATIENT_CLINIC_OR_DEPARTMENT_OTHER): Payer: Medicare Other | Admitting: Physician Assistant

## 2012-01-20 VITALS — BP 146/78 | HR 103 | Temp 97.1°F | Resp 18 | Ht 70.0 in | Wt 165.9 lb

## 2012-01-20 DIAGNOSIS — C3430 Malignant neoplasm of lower lobe, unspecified bronchus or lung: Secondary | ICD-10-CM

## 2012-01-20 DIAGNOSIS — C3490 Malignant neoplasm of unspecified part of unspecified bronchus or lung: Secondary | ICD-10-CM

## 2012-01-20 DIAGNOSIS — F411 Generalized anxiety disorder: Secondary | ICD-10-CM

## 2012-01-20 DIAGNOSIS — Z5111 Encounter for antineoplastic chemotherapy: Secondary | ICD-10-CM

## 2012-01-20 LAB — COMPREHENSIVE METABOLIC PANEL (CC13)
ALT: 31 U/L (ref 0–55)
AST: 29 U/L (ref 5–34)
Calcium: 9 mg/dL (ref 8.4–10.4)
Chloride: 102 mEq/L (ref 98–107)
Creatinine: 0.8 mg/dL (ref 0.7–1.3)
Sodium: 135 mEq/L — ABNORMAL LOW (ref 136–145)
Total Bilirubin: 0.6 mg/dL (ref 0.20–1.20)

## 2012-01-20 LAB — CBC WITH DIFFERENTIAL/PLATELET
BASO%: 0.7 % (ref 0.0–2.0)
LYMPH%: 30.5 % (ref 14.0–49.0)
MCHC: 34.6 g/dL (ref 32.0–36.0)
MONO#: 1 10*3/uL — ABNORMAL HIGH (ref 0.1–0.9)
Platelets: 185 10*3/uL (ref 140–400)
RBC: 3.87 10*6/uL — ABNORMAL LOW (ref 4.20–5.82)
RDW: 16.4 % — ABNORMAL HIGH (ref 11.0–14.6)
WBC: 7.6 10*3/uL (ref 4.0–10.3)
nRBC: 0 % (ref 0–0)

## 2012-01-20 MED ORDER — LORAZEPAM 0.5 MG PO TABS: 0.5000 mg | ORAL_TABLET | Freq: Three times a day (TID) | ORAL | Status: DC | PRN

## 2012-01-20 MED ORDER — DEXAMETHASONE SODIUM PHOSPHATE 4 MG/ML IJ SOLN
20.0000 mg | Freq: Once | INTRAMUSCULAR | Status: AC
  Administered 2012-01-20: 20 mg via INTRAVENOUS

## 2012-01-20 MED ORDER — SODIUM CHLORIDE 0.9 % IJ SOLN
10.0000 mL | INTRAMUSCULAR | Status: DC | PRN
  Administered 2012-01-20: 10 mL
  Filled 2012-01-20: qty 10

## 2012-01-20 MED ORDER — SODIUM CHLORIDE 0.9 % IV SOLN
390.0000 mg | Freq: Once | INTRAVENOUS | Status: AC
  Administered 2012-01-20: 390 mg via INTRAVENOUS
  Filled 2012-01-20: qty 39

## 2012-01-20 MED ORDER — ONDANSETRON 16 MG/50ML IVPB (CHCC)
16.0000 mg | Freq: Once | INTRAVENOUS | Status: AC
  Administered 2012-01-20: 16 mg via INTRAVENOUS

## 2012-01-20 MED ORDER — SODIUM CHLORIDE 0.9 % IV SOLN
Freq: Once | INTRAVENOUS | Status: AC
  Administered 2012-01-20: 14:00:00 via INTRAVENOUS

## 2012-01-20 MED ORDER — HEPARIN SOD (PORK) LOCK FLUSH 100 UNIT/ML IV SOLN
500.0000 [IU] | Freq: Once | INTRAVENOUS | Status: AC | PRN
  Administered 2012-01-20: 500 [IU]
  Filled 2012-01-20: qty 5

## 2012-01-20 MED ORDER — DIPHENHYDRAMINE HCL 50 MG/ML IJ SOLN
50.0000 mg | Freq: Once | INTRAMUSCULAR | Status: AC
  Administered 2012-01-20: 50 mg via INTRAVENOUS

## 2012-01-20 MED ORDER — PROCHLORPERAZINE MALEATE 10 MG PO TABS: 10.0000 mg | ORAL_TABLET | Freq: Four times a day (QID) | ORAL | Status: DC | PRN

## 2012-01-20 MED ORDER — FAMOTIDINE IN NACL 20-0.9 MG/50ML-% IV SOLN
20.0000 mg | Freq: Once | INTRAVENOUS | Status: AC
  Administered 2012-01-20: 20 mg via INTRAVENOUS

## 2012-01-20 MED ORDER — PACLITAXEL CHEMO INJECTION 300 MG/50ML
150.0000 mg/m2 | Freq: Once | INTRAVENOUS | Status: AC
  Administered 2012-01-20: 300 mg via INTRAVENOUS
  Filled 2012-01-20: qty 50

## 2012-01-20 NOTE — Progress Notes (Signed)
Austin [name] reports he is doing the best that he can.  He continues to struggle with weakness and fatigue.  His weight is stable at 165.9 pounds documented 10/01 from 163.4 pounds 09/19.  The patient is drinking 1 Ensure Plus a day.  He does not particularly care for these but he is able to force himself to drink 1.  NUTRITION DIAGNOSIS:  Unintended weight loss has improved.  INTERVENTION:  Austin [name] was encouraged to continue high-calorie, high-protein foods in small amounts throughout the day.  I have stressed the importance of continuing Ensure Plus once daily and if he notices his oral intake has decreased he should increase to b.i.d.  The patient verbalizes understanding.  MONITORING/EVALUATION/GOALS:  The patient will tolerate oral diet with Ensure Plus once daily to maintain weight.  NEXT VISIT:  Tuesday, 10/22 during treatment.    ______________________________ Zenovia Jarred, RD, CSO, LDN Clinical Nutrition Specialist BN/MEDQ  D:  01/20/2012  T:  01/20/2012  Job:  [id number]

## 2012-01-20 NOTE — Patient Instructions (Addendum)
Continue with weekly labs Follow up in 3 weeks prior to your next cycle of chemotherapy 

## 2012-01-20 NOTE — Patient Instructions (Addendum)
[facility] Discharge Instructions for Patients Receiving Chemotherapy  Today you received the following chemotherapy agents Taxol and Carboplatin  To help prevent nausea and vomiting after your treatment, we encourage you to take your nausea medication as prescribed.   If you develop nausea and vomiting that is not controlled by your nausea medication, call the clinic. If it is after clinic hours your family physician or the after hours number for the clinic or go to the Emergency Department.   BELOW ARE SYMPTOMS THAT SHOULD BE REPORTED IMMEDIATELY:  *FEVER GREATER THAN 100.5 F  *CHILLS WITH OR WITHOUT FEVER  NAUSEA AND VOMITING THAT IS NOT CONTROLLED WITH YOUR NAUSEA MEDICATION  *UNUSUAL SHORTNESS OF BREATH  *UNUSUAL BRUISING OR BLEEDING  TENDERNESS IN MOUTH AND THROAT WITH OR WITHOUT PRESENCE OF ULCERS  *URINARY PROBLEMS  *BOWEL PROBLEMS  UNUSUAL RASH Items with * indicate a potential emergency and should be followed up as soon as possible.  One of the nurses will contact you 24 hours after your treatment. Please let the nurse know about any problems that you may have experienced. Feel free to call the clinic you have any questions or concerns. The clinic phone number is [phone].   

## 2012-01-21 ENCOUNTER — Ambulatory Visit: Payer: Medicare Other

## 2012-01-21 ENCOUNTER — Ambulatory Visit (HOSPITAL_BASED_OUTPATIENT_CLINIC_OR_DEPARTMENT_OTHER): Payer: Medicare Other

## 2012-01-21 ENCOUNTER — Telehealth: Payer: Self-pay | Admitting: Medical Oncology

## 2012-01-21 ENCOUNTER — Telehealth: Payer: Self-pay | Admitting: Internal Medicine

## 2012-01-21 DIAGNOSIS — C3430 Malignant neoplasm of lower lobe, unspecified bronchus or lung: Secondary | ICD-10-CM

## 2012-01-21 MED ORDER — PEGFILGRASTIM INJECTION 6 MG/0.6ML
6.0000 mg | Freq: Once | SUBCUTANEOUS | Status: AC
  Administered 2012-01-21: 6 mg via SUBCUTANEOUS
  Filled 2012-01-21: qty 0.6

## 2012-01-21 NOTE — Telephone Encounter (Signed)
Gave pt appt for October and November  2013,. Lab, chemo and ML

## 2012-01-21 NOTE — Telephone Encounter (Signed)
Added f/u for 10/22 and additional wkly lbs. S/w pt today and he will get new schedule when he comes in today for inj.

## 2012-01-21 NOTE — Telephone Encounter (Signed)
Pt called stating that he always get an injection the day after his chemo treatment. He said he is not aware of an appt today and would like to know does he need to come in. I informed pt that he had an appt this am at 11:30am. Per Liborio Nixon pt can come in around 300 pm today. I asked him to make sure he picks up a calender at this time. He voiced understanding. Appt rescheduled by Thurston Hole.

## 2012-01-22 NOTE — Progress Notes (Signed)
[facility] [phone]   [phone]  OFFICE PROGRESS NOTE  Austin Mann, MD [address] [place] [address]  DIAGNOSIS: Metastatic non-small cell lung cancer, squamous cell carcinoma presenting with 2 nodules in the left lung as well as one nodule in the right lung diagnosed in March of 2013.   PRIOR THERAPY: None   CURRENT THERAPY: systemic chemotherapy with carboplatin for AUC of 5 and paclitaxel 175 mg/M2 every 3 weeks with Neulasta support. Status post 3 cycles  INTERVAL HISTORY: Austin [name] 78 y.o. male returns to the clinic today for routine followup visit accompanied by two family members. He complains of nausea. He ran out of his nausea medication 2 weeks ago. He continues to complain of difficulty sleeping. He was prescribed Restoril 15 mg but states he was only able to sleep 2-3 hours. He and his wife report that he is having increased anxiety. He presents today to proceed with cycle #4 of his systemic chemotherapy with carboplatin and paclitaxel with Neulasta support.He continues to have mild fatigue and mild peripheral neuropathy in the lower extremities. He denied having any significant chest pain, shortness breath, cough or hemoptysis.   MEDICAL HISTORY: Past Medical History  Diagnosis Date  . Stroke     1980S  . Shortness of breath     WITH COUGH  . Blood transfusion   . Cancer 06/2011    lung    ALLERGIES:  is allergic to penicillins.  MEDICATIONS:  Current Outpatient Prescriptions  Medication Sig Dispense Refill  . aspirin 81 MG tablet Take 81 mg by mouth daily.      . Chlorphen-Phenyleph-ASA (ALKA-SELTZER PLUS COLD PO) Take 1 tablet by mouth as needed. For cold symptoms      . gabapentin (NEURONTIN) 300 MG capsule Take 300 mg by mouth daily.       Marland Kitchen lidocaine-prilocaine (EMLA) cream Apply topically as needed.  30 g  0  . LORazepam (ATIVAN) 0.5 MG tablet Take 1 tablet (0.5 mg total) by mouth every 8  (eight) hours as needed for anxiety (or agitation).  40 tablet  0  . prochlorperazine (COMPAZINE) 10 MG tablet Take 1 tablet (10 mg total) by mouth every 6 (six) hours as needed.  60 tablet  1  . temazepam (RESTORIL) 15 MG capsule Take 1 capsule (15 mg total) by mouth at bedtime as needed for sleep.  30 capsule  0   No current facility-administered medications for this visit.   Facility-Administered Medications Ordered in Other Visits  Medication Dose Route Frequency Provider Last Rate Last Dose  . pegfilgrastim (NEULASTA) injection 6 mg  6 mg Subcutaneous Once Si Gaul, MD   6 mg at 01/21/12 1518    SURGICAL HISTORY:  Past Surgical History  Procedure Date  . Cholecystectomy   . Cardiovascular stress test     UNSURE OF DATE  ([facility])    REVIEW OF SYSTEMS:  A comprehensive review of systems was negative except for: Constitutional: positive for fatigue   PHYSICAL EXAMINATION: General appearance: alert, cooperative and no distress Head: Normocephalic, without obvious abnormality, atraumatic Neck: no adenopathy Lymph nodes: Cervical, supraclavicular, and axillary nodes normal. Resp: clear to auscultation bilaterally Cardio: regular rate and rhythm, S1, S2 normal, no murmur, click, rub or gallop GI: soft, non-tender; bowel sounds normal; no masses,  no organomegaly Extremities: extremities normal, atraumatic, no cyanosis or edema Neurologic: Alert and oriented X 3, normal strength and tone. Normal  symmetric reflexes. Normal coordination and gait  ECOG PERFORMANCE STATUS: 1 - Symptomatic but completely ambulatory  Blood pressure 146/78, pulse 103, temperature 97.1 F (36.2 C), temperature source Oral, resp. rate 18, height 5\' 10"  (1.778 m), weight 165 lb 14.4 oz (75.252 kg).  LABORATORY DATA: Lab Results  Component Value Date   WBC 7.6 01/20/2012   HGB 13.3 01/20/2012   HCT 38.4 01/20/2012   MCV 99.2* 01/20/2012   PLT 185 01/20/2012      Chemistry      Component  Value Date/Time   NA 135* 01/20/2012 1121   NA 132* 12/09/2011 1339   K 3.6 01/20/2012 1121   K 4.2 12/09/2011 1339   CL 102 01/20/2012 1121   CL 98 12/09/2011 1339   CO2 22 01/20/2012 1121   CO2 27 12/09/2011 1339   BUN 13.0 01/20/2012 1121   BUN 12 12/09/2011 1339   CREATININE 0.8 01/20/2012 1121   CREATININE 0.84 12/09/2011 1339      Component Value Date/Time   CALCIUM 9.0 01/20/2012 1121   CALCIUM 8.9 12/09/2011 1339   ALKPHOS 128 01/20/2012 1121   ALKPHOS 170* 12/09/2011 1339   AST 29 01/20/2012 1121   AST 20 12/09/2011 1339   ALT 31 01/20/2012 1121   ALT 18 12/09/2011 1339   BILITOT 0.60 01/20/2012 1121   BILITOT 0.4 12/09/2011 1339       RADIOGRAPHIC STUDIES: Ct Chest W Contrast  12/15/2011  *RADIOLOGY REPORT*  Clinical Data:  Lung carcinoma, post chemotherapy.  CT CHEST, ABDOMEN AND PELVIS WITH CONTRAST  Technique:  Multidetector CT imaging of the chest, abdomen and pelvis was performed following the standard protocol during bolus administration of intravenous contrast.  Contrast: OMNIPAQUE IOHEXOL 300 MG/ML  SOLN  Comparison:  PET CT 07/04/2011 and earlier studies   CT CHEST  Findings:  Right IJ port catheter extends to the cavoatrial junction.  Patchy coronary and aortic calcifications are evident. No pleural or pericardial effusion.  Sub centimeter precarinal, pretracheal, and AP window lymph nodes are stable in size.  No hilar adenopathy.  Metallic shot in the right anterior chest wall as before.  19 x 35 mm spiculated mass in the superior segment left lower lobe, previously 23 x 37, now with some central cavitation evident. There is a second spiculated 10 x 13 mm mass in the right suprahilar region adjacent to the major fissure in the left lower lobe (previously 18 x 19 mm). 7 x 11 mm spiculated   subpleural nodule in the superior segment right lower lobe, previously 8.8 mm. No new pulmonary nodule. Thoracic spine and sternum unremarkable.  IMPRESSION:  1.  Interval decrease in size of  the 2   left lower lobe superior segment nodules. 2.  Slight increase in size of the smaller nodule in the superior segment right lower lobe. 3. No new nodule, mass, or adenopathy.   CT ABDOMEN AND PELVIS  Findings:  Calcified granulomas in the liver and spleen.  Vascular clips in the gallbladder fossa.  Unremarkable adrenal glands. Small cyst in the upper pole left kidney.  Moderate aortoiliac arterial calcifications without aneurysm.  Portal vein patent. Atrophic pancreas.  Stomach and small bowel are nondistended. Appendix not discretely identified.  The colon is nondilated, unremarkable.  Urinary bladder is distended.  There is moderate prostatic enlargement with central coarse calcifications.  No ascites.  No free air.  No pelvic or retroperitoneal adenopathy. No hydronephrosis on delayed scans.  Degenerative disc disease L4- S1.  Probable  AVN in the left femoral head.  IMPRESSION:  1.  Negative for mass, nodule, or adenopathy. 2.  Additional ancillary findings as above.   Original Report Authenticated By: Osa Craver, M.D.    ASSESSMENT/PLAN: This is a very pleasant 78 years old white male with history of metastatic non-small cell lung cancer squamous cell carcinoma status post 3 cycles of systemic chemotherapy was carboplatin and paclitaxel with some improvement in his disease. The patient was discussed with Dr. Arbutus Ped. He will proceed with cycle #4 of his systemic chemotherapy with carboplatin for AUC of 5 and paclitaxel 175 mg/M2 with Neulasta support. He will be placed on Ativan 0.5 mg by mouth every 8 hours as needed for anxiety and a refill of his compazine was sent to his pharmacy of record via [facility]. He will continue with weekly labs consisting of a CBC Differential and CMET. He will return in 3 weeks prior to cycle #5 of his systemic chemotherapy.  Tiana Loft E   All questions were answered. The patient knows to call the clinic with any problems, questions or concerns. We  can certainly see the patient much sooner if necessary.  I spent 20 minutes counseling the patient face to face. The total time spent in the appointment was 30 minutes.

## 2012-01-27 ENCOUNTER — Emergency Department (HOSPITAL_COMMUNITY): Payer: Medicare HMO

## 2012-01-27 ENCOUNTER — Observation Stay (HOSPITAL_COMMUNITY)
Admission: EM | Admit: 2012-01-27 | Discharge: 2012-01-29 | Disposition: A | Discharge status: Home / Self Care | Payer: Medicare HMO | Attending: Internal Medicine | Admitting: Internal Medicine

## 2012-01-27 ENCOUNTER — Telehealth: Payer: Self-pay | Admitting: Medical Oncology

## 2012-01-27 ENCOUNTER — Encounter (HOSPITAL_COMMUNITY): Payer: Self-pay

## 2012-01-27 ENCOUNTER — Other Ambulatory Visit: Payer: Medicare Other | Admitting: Lab

## 2012-01-27 DIAGNOSIS — C801 Malignant (primary) neoplasm, unspecified: Secondary | ICD-10-CM | POA: Insufficient documentation

## 2012-01-27 DIAGNOSIS — Z5111 Encounter for antineoplastic chemotherapy: Secondary | ICD-10-CM

## 2012-01-27 DIAGNOSIS — E860 Dehydration: Principal | ICD-10-CM | POA: Diagnosis present

## 2012-01-27 DIAGNOSIS — D72829 Elevated white blood cell count, unspecified: Secondary | ICD-10-CM | POA: Insufficient documentation

## 2012-01-27 DIAGNOSIS — Z79899 Other long term (current) drug therapy: Secondary | ICD-10-CM | POA: Insufficient documentation

## 2012-01-27 DIAGNOSIS — G622 Polyneuropathy due to other toxic agents: Secondary | ICD-10-CM | POA: Insufficient documentation

## 2012-01-27 DIAGNOSIS — R5383 Other fatigue: Secondary | ICD-10-CM

## 2012-01-27 DIAGNOSIS — M6281 Muscle weakness (generalized): Secondary | ICD-10-CM | POA: Insufficient documentation

## 2012-01-27 DIAGNOSIS — R627 Adult failure to thrive: Secondary | ICD-10-CM

## 2012-01-27 DIAGNOSIS — F17200 Nicotine dependence, unspecified, uncomplicated: Secondary | ICD-10-CM | POA: Insufficient documentation

## 2012-01-27 DIAGNOSIS — R531 Weakness: Secondary | ICD-10-CM

## 2012-01-27 DIAGNOSIS — T451X5A Adverse effect of antineoplastic and immunosuppressive drugs, initial encounter: Secondary | ICD-10-CM | POA: Insufficient documentation

## 2012-01-27 DIAGNOSIS — C3490 Malignant neoplasm of unspecified part of unspecified bronchus or lung: Secondary | ICD-10-CM

## 2012-01-27 HISTORY — DX: Malignant neoplasm of unspecified part of unspecified bronchus or lung: C34.90

## 2012-01-27 LAB — CBC WITH DIFFERENTIAL/PLATELET
Basophils Absolute: 0 10*3/uL (ref 0.0–0.1)
Eosinophils Absolute: 0.1 10*3/uL (ref 0.0–0.7)
HCT: 31.5 % — ABNORMAL LOW (ref 39.0–52.0)
Lymphs Abs: 1.2 10*3/uL (ref 0.7–4.0)
MCHC: 35.9 g/dL (ref 30.0–36.0)
MCV: 98.1 fL (ref 78.0–100.0)
Neutro Abs: 0.7 10*3/uL — ABNORMAL LOW (ref 1.7–7.7)
RDW: 15.5 % (ref 11.5–15.5)

## 2012-01-27 LAB — COMPREHENSIVE METABOLIC PANEL
Alkaline Phosphatase: 133 U/L — ABNORMAL HIGH (ref 39–117)
BUN: 12 mg/dL (ref 6–23)
Calcium: 9.2 mg/dL (ref 8.4–10.5)
GFR calc Af Amer: >90 mL/min (ref >90)
Glucose, Bld: 150 mg/dL — ABNORMAL HIGH (ref 70–99)
Total Protein: 6.6 g/dL (ref 6.0–8.3)

## 2012-01-27 LAB — TROPONIN I: Troponin I: <0.3 ng/mL (ref <0.30)

## 2012-01-27 MED ORDER — ZOLPIDEM TARTRATE 5 MG PO TABS
5.0000 mg | ORAL_TABLET | Freq: Every evening | ORAL | Status: DC | PRN
  Administered 2012-01-28 (×2): 5 mg via ORAL
  Filled 2012-01-27 (×2): qty 1

## 2012-01-27 MED ORDER — TEMAZEPAM 15 MG PO CAPS: 15.0000 mg | ORAL_CAPSULE | Freq: Every evening | ORAL | Status: DC | PRN

## 2012-01-27 MED ORDER — SODIUM CHLORIDE 0.9 % IJ SOLN: 3.0000 mL | Freq: Two times a day (BID) | INTRAMUSCULAR | Status: DC

## 2012-01-27 MED ORDER — SODIUM CHLORIDE 0.9 % IV SOLN: INTRAVENOUS | Status: DC

## 2012-01-27 MED ORDER — LORAZEPAM 2 MG/ML IJ SOLN
0.5000 mg | Freq: Once | INTRAMUSCULAR | Status: AC
  Administered 2012-01-27: 0.5 mg via INTRAVENOUS
  Filled 2012-01-27: qty 1

## 2012-01-27 MED ORDER — PROCHLORPERAZINE MALEATE 10 MG PO TABS
10.0000 mg | ORAL_TABLET | Freq: Four times a day (QID) | ORAL | Status: DC | PRN
  Filled 2012-01-27: qty 1

## 2012-01-27 MED ORDER — SODIUM CHLORIDE 0.9 % IV BOLUS (SEPSIS)
2000.0000 mL | Freq: Once | INTRAVENOUS | Status: AC
  Administered 2012-01-27: 1000 mL via INTRAVENOUS

## 2012-01-27 MED ORDER — SODIUM CHLORIDE 0.9 % IV SOLN
INTRAVENOUS | Status: AC
  Administered 2012-01-27: via INTRAVENOUS

## 2012-01-27 MED ORDER — ASPIRIN EC 81 MG PO TBEC
81.0000 mg | DELAYED_RELEASE_TABLET | Freq: Every day | ORAL | Status: DC
  Administered 2012-01-28: 81 mg via ORAL
  Filled 2012-01-27: qty 1

## 2012-01-27 MED ORDER — ONDANSETRON HCL 4 MG/2ML IJ SOLN
4.0000 mg | Freq: Once | INTRAMUSCULAR | Status: AC
  Administered 2012-01-27: 4 mg via INTRAVENOUS
  Filled 2012-01-27: qty 2

## 2012-01-27 NOTE — ED Notes (Signed)
Pt requesting gingerale to drink.  Given waiting on iv team

## 2012-01-27 NOTE — ED Notes (Signed)
Report called to 2000 

## 2012-01-27 NOTE — H&P (Signed)
PCP:   OH PARK, ANGELA, MD   Chief Complaint:  Generalized weakness  HPI: 78 yo male lung cancer s/p 4th round of chemo comes in with prog worsening gen weakness and poor po intake.  deneis f/n/v/d/cp/sob/cough/or abd pain.  No le edema or swelling.  Has received over a liter ivf ns in ED and already feels better.  Next chemo tx in 2 weeks.  No rashes on body.    Review of Systems:  O/w neg  Past Medical History: Past Medical History  Diagnosis Date  . Stroke     1980S  . Shortness of breath     WITH COUGH  . Blood transfusion   . Cancer 06/2011    lung  . Cancer of lung    Past Surgical History  Procedure Date  . Cholecystectomy   . Cardiovascular stress test     UNSURE OF DATE  ([facility])    Medications: Prior to Admission medications   Medication Sig Start Date End Date Taking? Authorizing Provider  aspirin 81 MG tablet Take 81 mg by mouth daily.   Yes Historical Provider, MD  lidocaine-prilocaine (EMLA) cream Apply topically as needed. 10/09/11 10/08/12 Yes Si Gaul, MD  prochlorperazine (COMPAZINE) 10 MG tablet Take 1 tablet (10 mg total) by mouth every 6 (six) hours as needed. 01/20/12  Yes Conni Slipper, PA  temazepam (RESTORIL) 15 MG capsule Take 1 capsule (15 mg total) by mouth at bedtime as needed for sleep. 11/06/11 02/08/12 Yes Conni Slipper, PA    Allergies:   Allergies  Allergen Reactions  . Penicillins Swelling    Social History:  reports that he has been smoking Cigarettes.  He has a 25 pack-year smoking history. He has never used smokeless tobacco. He reports that he does not drink alcohol or use illicit drugs.  Family History: Family History  Problem Relation Age of Onset  . Anesthesia problems Neg Hx   . Hypotension Neg Hx   . Malignant hyperthermia Neg Hx   . Pseudochol deficiency Neg Hx     Physical Exam: Filed Vitals:   01/27/12 1333 01/27/12 1640 01/27/12 1827 01/27/12 1852  BP: 123/53 134/65 134/65 151/72  Pulse:  121 115 112 113  Temp: 97.7 F (36.5 C)  98 F (36.7 C)   TempSrc: Oral  Oral   Resp: 18 17 18 14   SpO2: 99% 100% 98% 100%   General appearance: alert, cooperative and no distress Lungs: clear to auscultation bilaterally Heart: regular rate and rhythm, S1, S2 normal, no murmur, click, rub or gallop Abdomen: soft, non-tender; bowel sounds normal; no masses,  no organomegaly Extremities: extremities normal, atraumatic, no cyanosis or edema Pulses: 2+ and symmetric Skin: Skin color, texture, turgor normal. No rashes or lesions Neurologic: Grossly normal    Labs on Admission:   [facility] 01/27/12 1337  NA 132*  K 3.6  CL 97  CO2 25  GLUCOSE 150*  BUN 12  CREATININE 0.74  CALCIUM 9.2  MG --  PHOS --    Basename 01/27/12 1337  AST 21  ALT 29  ALKPHOS 133*  BILITOT 0.8  PROT 6.6  ALBUMIN 3.4*    Basename 01/27/12 1337  WBC 3.1*  NEUTROABS 0.7*  HGB 11.3*  HCT 31.5*  MCV 98.1  PLT 28*    Basename 01/27/12 1337  CKTOTAL --  CKMB --  CKMBINDEX --  TROPONINI <0.30   Radiological Exams on Admission: Dg Chest 2 View  01/27/2012  *RADIOLOGY REPORT*  Clinical Data: Weakness.  Lung cancer.  CHEST - 2 VIEW  Comparison: CT scan 12/15/2011  Findings: Right power port catheter noted with tip projecting within the SVC. Density surrounding the port is probably related to the port itself rather than a new pulmonary nodule.  Left lower lobe lung nodules are again noted, but are much more conspicuous on CT.  There partially obscured by the hila on the frontal projection.  Atherosclerotic aortic arch noted.  [name] shot projects over the right chest.  No pleural effusion observed.  Previous nodule medially in the superior segment right lower lobe is not visible.  IMPRESSION: 1.  Left lower lobe lung nodules are faintly apparent.  Previous lesion in the superior segment right lower lobe is not well visualized on conventional radiography.  Valid size comparison would probably require a  repeat CT.  2.  Right power port catheter tip projects in the SVC.  Vague density projecting over the port reservoir is probably related to the port itself rather than a new underlying pulmonary nodule.   Original Report Authenticated By: Dellia Cloud, M.D.     Assessment/Plan Present on Admission:  78 yo male active chemo for lung cancer with generalized weakness likely secondary to mild dehydration/FTT .Dehydration .Weakness generalized .Squamous cell lung cancer  obs the pt overnight for ivf.  Wishes to stay here at [facility] for treatment.  No fever or underlying source for infection.  Neuro exam is normal.  Laporsha Grealish [phone] 01/27/2012, 10:59 PM

## 2012-01-27 NOTE — ED Notes (Signed)
Discussion about  Staying the night.  The pt does not want to stay.  Up walking in the hallway pt very unstable gait c/o weakness still.  His sister at the bedside talked the pt into staying.  The pt lives alone and the sister cannot stay with him.

## 2012-01-27 NOTE — ED Notes (Signed)
Iv team called to access his porta-cath

## 2012-01-27 NOTE — Telephone Encounter (Signed)
[name] Weeks left message that pt is " sick , can't walk" . I see that pt is admitted

## 2012-01-27 NOTE — ED Notes (Signed)
The pt is resting 1/2 nss infused form the 1st bag .  He will receive one other

## 2012-01-27 NOTE — ED Notes (Signed)
Iv team here to access the power  port

## 2012-01-27 NOTE — ED Notes (Signed)
Pt is a chemo pt with lung cancer.  Pt states he stays weak since beginning chemo, however this is the weakest he has felt.

## 2012-01-27 NOTE — ED Notes (Signed)
The pt is more relaxed now

## 2012-01-27 NOTE — ED Notes (Signed)
Waiting on the iv team  still

## 2012-01-27 NOTE — ED Provider Notes (Signed)
History     CSN: [id number]  Arrival date & time 01/27/12  1325   First MD Initiated Contact with Patient 01/27/12 1521      Chief Complaint  Patient presents with  . Weakness  . Lung Cancer    (Consider location/radiation/quality/duration/timing/severity/associated sxs/prior treatment) HPI Pt with history of metastatic lung cancer has recently gotten his forth round of chemo infusion about a week ago. He also got what sounds like a Neupogen shot the following day. He reports he has been gradually feeling worse since that time. He has poor appetite, generalized weakness, difficulty walking. Associated with nausea, no vomiting, diarrhea or fever. Family at bedside attempted to contact his oncologist today but did get a reply this afternoon, so he came to the ED for evaluation.   Past Medical History  Diagnosis Date  . Stroke     1980S  . Shortness of breath     WITH COUGH  . Blood transfusion   . Cancer 06/2011    lung    Past Surgical History  Procedure Date  . Cholecystectomy   . Cardiovascular stress test     UNSURE OF DATE  ([facility])    Family History  Problem Relation Age of Onset  . Anesthesia problems Neg Hx   . Hypotension Neg Hx   . Malignant hyperthermia Neg Hx   . Pseudochol deficiency Neg Hx     History  Substance Use Topics  . Smoking status: Current Every Day Smoker -- 0.5 packs/day for 50 years    Types: Cigarettes  . Smokeless tobacco: Never Used   Comment: 10 cigs a day  . Alcohol Use: No     quit 2000      Review of Systems All other systems reviewed and are negative except as noted in HPI.   Allergies  Penicillins  Home Medications   Current Outpatient Rx  Name Route Sig Dispense Refill  . ASPIRIN 81 MG PO TABS Oral Take 81 mg by mouth daily.    Marland Kitchen LIDOCAINE-PRILOCAINE 2.5-2.5 % EX CREA Topical Apply topically as needed. 30 g 0  . PROCHLORPERAZINE MALEATE 10 MG PO TABS Oral Take 1 tablet (10 mg total) by mouth every 6 (six)  hours as needed. 60 tablet 1  . TEMAZEPAM 15 MG PO CAPS Oral Take 1 capsule (15 mg total) by mouth at bedtime as needed for sleep. 30 capsule 0    BP 123/53  Pulse 121  Temp 97.7 F (36.5 C) (Oral)  Resp 18  SpO2 99%  Physical Exam  Nursing note and vitals reviewed. Constitutional: He is oriented to person, place, and time. He appears well-developed and well-nourished.  HENT:  Head: Normocephalic and atraumatic.       Dry mouth  Eyes: EOM are normal. Pupils are equal, round, and reactive to light.  Neck: Normal range of motion. Neck supple.  Cardiovascular: Normal rate, normal heart sounds and intact distal pulses.   Pulmonary/Chest: Effort normal and breath sounds normal.  Abdominal: Bowel sounds are normal. He exhibits no distension. There is no tenderness.  Musculoskeletal: Normal range of motion. He exhibits no edema and no tenderness.  Neurological: He is alert and oriented to person, place, and time. He has normal strength. No cranial nerve deficit or sensory deficit.  Skin: Skin is warm and dry. No rash noted.  Psychiatric: He has a normal mood and affect.    ED Course  Procedures (including critical care time)  Labs Reviewed  CBC WITH  DIFFERENTIAL - Abnormal; Notable for the following:    WBC 3.1 (*)     RBC 3.21 (*)     Hemoglobin 11.3 (*)     HCT 31.5 (*)     MCH 35.2 (*)     Platelets 28 (*)     Neutrophils Relative 21 (*)     Monocytes Relative 36 (*)     Neutro Abs 0.7 (*)     Monocytes Absolute 1.1 (*)     All other components within normal limits  COMPREHENSIVE METABOLIC PANEL - Abnormal; Notable for the following:    Sodium 132 (*)     Glucose, Bld 150 (*)     Albumin 3.4 (*)     Alkaline Phosphatase 133 (*)     GFR calc non Af Amer 87 (*)     All other components within normal limits  TROPONIN I   Dg Chest 2 View  01/27/2012  *RADIOLOGY REPORT*  Clinical Data: Weakness.  Lung cancer.  CHEST - 2 VIEW  Comparison: CT scan 12/15/2011  Findings:  Right power port catheter noted with tip projecting within the SVC. Density surrounding the port is probably related to the port itself rather than a new pulmonary nodule.  Left lower lobe lung nodules are again noted, but are much more conspicuous on CT.  There partially obscured by the hila on the frontal projection.  Atherosclerotic aortic arch noted.  [name] shot projects over the right chest.  No pleural effusion observed.  Previous nodule medially in the superior segment right lower lobe is not visible.  IMPRESSION: 1.  Left lower lobe lung nodules are faintly apparent.  Previous lesion in the superior segment right lower lobe is not well visualized on conventional radiography.  Valid size comparison would probably require a repeat CT.  2.  Right power port catheter tip projects in the SVC.  Vague density projecting over the port reservoir is probably related to the port itself rather than a new underlying pulmonary nodule.   Original Report Authenticated By: Dellia Cloud, M.D.      No diagnosis found.    MDM  Pancytopenia related to recent chemo. Will give IVF and reassess  On reassessment, pt still tachycardic and unsteady on his feet. Initially refused admission but then consented. [facility] to admit for hydration.        Archer Moist B. Bernette Mayers, MD 01/28/12 1039

## 2012-01-27 NOTE — ED Notes (Signed)
Pt complains of weakness, recent dx with lung cancer and started chemo, pt sts he just feels nervous all over.

## 2012-01-27 NOTE — ED Notes (Signed)
Fluid started med given

## 2012-01-28 DIAGNOSIS — E860 Dehydration: Secondary | ICD-10-CM

## 2012-01-28 DIAGNOSIS — R627 Adult failure to thrive: Secondary | ICD-10-CM | POA: Diagnosis present

## 2012-01-28 DIAGNOSIS — R5381 Other malaise: Secondary | ICD-10-CM

## 2012-01-28 DIAGNOSIS — R5383 Other fatigue: Secondary | ICD-10-CM

## 2012-01-28 DIAGNOSIS — C3430 Malignant neoplasm of lower lobe, unspecified bronchus or lung: Secondary | ICD-10-CM

## 2012-01-28 LAB — BASIC METABOLIC PANEL
CO2: 23 mEq/L (ref 19–32)
GFR calc non Af Amer: 90 mL/min — ABNORMAL LOW (ref >90)
Glucose, Bld: 86 mg/dL (ref 70–99)
Potassium: 3.9 mEq/L (ref 3.5–5.1)
Sodium: 131 mEq/L — ABNORMAL LOW (ref 135–145)

## 2012-01-28 LAB — CBC
HCT: 26.7 % — ABNORMAL LOW (ref 39.0–52.0)
Hemoglobin: 9.6 g/dL — ABNORMAL LOW (ref 13.0–17.0)
MCH: 34.8 pg — ABNORMAL HIGH (ref 26.0–34.0)
MCHC: 36 g/dL (ref 30.0–36.0)
MCV: 96.7 fL (ref 78.0–100.0)

## 2012-01-28 LAB — URINALYSIS, ROUTINE W REFLEX MICROSCOPIC
Glucose, UA: NEGATIVE mg/dL
Hgb urine dipstick: NEGATIVE
Ketones, ur: NEGATIVE mg/dL
Protein, ur: NEGATIVE mg/dL
pH: 7.5 (ref 5.0–8.0)

## 2012-01-28 LAB — PATHOLOGIST SMEAR REVIEW

## 2012-01-28 MED ORDER — SODIUM CHLORIDE 0.9 % IJ SOLN
10.0000 mL | INTRAMUSCULAR | Status: DC | PRN
  Administered 2012-01-28 – 2012-01-29 (×3): 10 mL

## 2012-01-28 MED ORDER — NICOTINE 14 MG/24HR TD PT24
14.0000 mg | MEDICATED_PATCH | Freq: Every day | TRANSDERMAL | Status: DC
  Administered 2012-01-28 – 2012-01-29 (×2): 14 mg via TRANSDERMAL
  Filled 2012-01-28 (×2): qty 1

## 2012-01-28 MED ORDER — ENSURE COMPLETE PO LIQD
237.0000 mL | Freq: Two times a day (BID) | ORAL | Status: DC
  Administered 2012-01-29: 237 mL via ORAL

## 2012-01-28 MED ORDER — ENSURE PUDDING PO PUDG
1.0000 | Freq: Three times a day (TID) | ORAL | Status: DC
  Administered 2012-01-28: 1 via ORAL

## 2012-01-28 NOTE — Consult Note (Signed)
ONCOLOGY  HOSPITAL CONSULTATION NOTE  Austin [name]                                MR#: [id number]  DOB: [birth year]                       CSN#: [id number]  Referring MD: Dr. Emmie [facility]  IM Teaching Service    MD   Reason for Consult: Lung Cancer  DIAGNOSIS: Metastatic non-small cell lung cancer, squamous cell carcinoma presenting with 2 nodules in the left lung as well as one nodule in the right lung diagnosed in March of 2013.   PRIOR THERAPY: None   CURRENT THERAPY: systemic chemotherapy with carboplatin for AUC of 5 and paclitaxel 175 mg/M2 every 3 weeks with Neulasta support on day 2. Status post 4 cycles Last received on 01/20/2012 Last ECOG was: 1 - Symptomatic but completely ambulatory   HPI:  He was admitted with progressive poor po intake and generalized weakness. He was found to be dehydrated requiring IV hydration with fairly  good response. He continues to have no appetite. Denies any nausea or vomiting. He is also complaining of worsening peripheral neuropathy, with bilateral numbness and tingling at the nailbeds. While these issues are being addressed, we were kindly informed of the patient's admission. Of note, he had been seen by our nutritionist, Austin [name] on 01/20/2012, at which time, Austin [name] was encouraged to continue high-calorie, high-protein foods in small amounts throughout the day.He was to follow up with her on 10/22.No new CTs or procedures have been performed.Excellent medical management is being provided by the admitting team.    PMH:  Past Medical History  Diagnosis Date  . Stroke     1980S  . Shortness of breath     WITH COUGH  . Blood transfusion   . Cancer 06/2011    lung  . Cancer of lung     Surgeries:  Past Surgical History  Procedure Date  . Cholecystectomy   . Cardiovascular stress test     UNSURE OF DATE  ([facility])    Allergies:  Allergies  Allergen Reactions  . Penicillins Swelling    Medications:    Prior to Admission:  Prescriptions prior to admission  Medication Sig Dispense Refill  . aspirin 81 MG tablet Take 81 mg by mouth daily.      Marland Kitchen lidocaine-prilocaine (EMLA) cream Apply topically as needed.  30 g  0  . prochlorperazine (COMPAZINE) 10 MG tablet Take 1 tablet (10 mg total) by mouth every 6 (six) hours as needed.  60 tablet  1  . temazepam (RESTORIL) 15 MG capsule Take 1 capsule (15 mg total) by mouth at bedtime as needed for sleep.  30 capsule  0    LKG:[id number], sodium chloride, zolpidem, DISCONTD: temazepam  ROS: Dee HPI for significant positives, rest of the ROS is essentially negative.  Family History:    Family History  Problem Relation Age of Onset  . Anesthesia problems Neg Hx   . Hypotension Neg Hx   . Malignant hyperthermia Neg Hx   . Pseudochol deficiency Neg Hx       Social History:  reports that he has been smoking Cigarettes.  He has a 25 pack-year smoking history. He has never used smokeless tobacco. He reports that he does not drink alcohol or use illicit drugs.  Physical Exam  Filed Vitals:   01/28/12 0506  BP: 129/58  Pulse: 108  Temp: 97.8 F (36.6 C)  Resp: 16     Filed Weights   01/28/12 0008  Weight: 165 lb 12.6 oz (75.2 kg)   General:   78 year old wm in no acute distress A. and O. x3  well-developed but  chronically ill appearing.  HEENT: Normocephalic, atraumatic, PERRLA. Sclerae anicteric. Oral cavity without thrush or lesions. NECK:supple. no thyromegaly, no cervical or supraclavicular adenopathy  LUNGS: clear bilaterally . No wheezing, rhonchi or rales. No axillary masses.R port negative CARDIOVASCULAR: regular rate and rhythm normal S1-S2, no murmur , rubs or gallops ABDOMEN: soft nontender , bowel sounds x4. No HSM. No masses palpable.  GU/rectal: deferred. EXTREMITIES: no clubbing cyanosis or edema. No bruising or petechial rash. No skin breakdown. MUSCULOSKELETAL: no spinal tenderness.  NEURO: Non Focal with  the exception of peripheral neuropathy. No Horner's.   Labs:  CBC   Lab 01/28/12 0500 01/27/12 1337  WBC 8.0 3.1*  HGB 9.6* 11.3*  HCT 26.7* 31.5*  PLT 33* 28*  MCV 96.7 98.1  MCH 34.8* 35.2*  MCHC 36.0 35.9  RDW 15.6* 15.5  LYMPHSABS -- 1.2  MONOABS -- 1.1*  EOSABS -- 0.1  BASOSABS -- 0.0  BANDABS -- --     CMP    Lab 01/28/12 0500 01/27/12 1337  NA 131* 132*  K 3.9 3.6  CL 99 97  CO2 23 25  GLUCOSE 86 150*  BUN 9 12  CREATININE 0.69 0.74  CALCIUM 8.4 9.2  MG -- --  AST -- 21  ALT -- 29  ALKPHOS -- 133*  BILITOT -- 0.8        Component Value Date/Time   BILITOT 0.8 01/27/2012 1337   BILITOT 0.60 01/20/2012 1121       Imaging Studies:  Dg Chest 2 View  01/27/2012  *RADIOLOGY REPORT*  Clinical Data: Weakness.  Lung cancer.  CHEST - 2 VIEW  Comparison: CT scan 12/15/2011  Findings: Right power port catheter noted with tip projecting within the SVC. Density surrounding the port is probably related to the port itself rather than a new pulmonary nodule.  Left lower lobe lung nodules are again noted, but are much more conspicuous on CT.  There partially obscured by the hila on the frontal projection.  Atherosclerotic aortic arch noted.  Austin [name] shot projects over the right chest.  No pleural effusion observed.  Previous nodule medially in the superior segment right lower lobe is not visible.  IMPRESSION: 1.  Left lower lobe lung nodules are faintly apparent.  Previous lesion in the superior segment right lower lobe is not well visualized on conventional radiography.  Valid size comparison would probably require a repeat CT.  2.  Right power port catheter tip projects in the SVC.  Vague density projecting over the port reservoir is probably related to the port itself rather than a new underlying pulmonary nodule.   Original Report Authenticated By: Dellia Cloud, M.D.       A/P: 78 y.o. male with history of metastatic non-small cell lung cancer squamous cell  carcinoma status post 4 cycles of systemic chemotherapy  with carboplatin and paclitaxel with some improvement in his disease. Neulasta support was received on 10/02.His cycle #5 of his systemic chemotherapy was scheduled for 10/22. He was admitted with poor po intake and generalized weakness. He was found to be severely dehydrated requiring fluid resuscitation.  We were kindly informed of the patient's admission. Dr.Hanako Tipping  is to see the patient following this consult  Thank you for all the care provided to Mr Jeanmarie during his hospitalization.   [facility] E 01/28/2012 10:39 AM  Hematology/oncology attending:  The patient is seen and examined. I agree with the above note. He has a history of metastatic non-small cell lung cancer, squamous cell carcinoma status post 4 cycles of systemic chemotherapy with carboplatin and paclitaxel with some improvement in his disease after cycle #3. The patient was admitted with generalized weakness poor by mouth intake and dehydration. He was started on IV hydration and feeling a little bit better today. He is still very weak to go home. The patient may benefit from disposition to skilled nursing facility for rehabilitation. I would see him back for followup visit on February 10, 2012 for evaluation before starting the next cycle of his chemotherapy. Thank you so much for allowing me to birthdate in the care of Austin [name]. I will continue to follow the patient with you and assist in his management.

## 2012-01-28 NOTE — Progress Notes (Addendum)
[facility]             Progress Note   Subjective: Feels better, has not eaten anything  Objective: Vital signs in last 24 hours: Temp:  [97.7 F (36.5 C)-98.2 F (36.8 C)] 97.8 F (36.6 C) (10/09 0506) Pulse Rate:  [108-121] 108  (10/09 0506) Resp:  [14-18] 16  (10/09 0506) BP: (123-151)/(53-80) 129/58 mmHg (10/09 0506) SpO2:  [97 %-100 %] 97 % (10/09 0506) Weight:  [75.2 kg (165 lb 12.6 oz)] 75.2 kg (165 lb 12.6 oz) (10/09 0008) Weight change:  Last BM Date: 01/27/12  Intake/Output from previous day: 10/08 0701 - 10/09 0700 In: 2050 [P.O.:50; I.V.:2000] Out: 925 [Urine:925]     Physical Exam: General: Alert, awake, oriented x3, in no acute distress. HEENT: No bruits, no goiter. Heart: Regular rate and rhythm, without murmurs, rubs, gallops. Lungs: Clear to auscultation bilaterally. Abdomen: Soft, nontender, nondistended, positive bowel sounds. Extremities: No clubbing cyanosis or edema with positive pedal pulses. Neuro: Grossly intact, nonfocal.    Lab Results: Basic Metabolic Panel:  Basename 01/28/12 0500 01/27/12 1337  NA 131* 132*  K 3.9 3.6  CL 99 97  CO2 23 25  GLUCOSE 86 150*  BUN 9 12  CREATININE 0.69 0.74  CALCIUM 8.4 9.2  MG -- --  PHOS -- --   Liver Function Tests:  Basename 01/27/12 1337  AST 21  ALT 29  ALKPHOS 133*  BILITOT 0.8  PROT 6.6  ALBUMIN 3.4*   No results found for this basename: LIPASE:2,AMYLASE:2 in the last 72 hours No results found for this basename: AMMONIA:2 in the last 72 hours CBC:  Basename 01/28/12 0500 01/27/12 1337  WBC 8.0 3.1*  NEUTROABS -- 0.7*  HGB 9.6* 11.3*  HCT 26.7* 31.5*  MCV 96.7 98.1  PLT 33* 28*   Cardiac Enzymes:  Basename 01/28/12 0542 01/27/12 2342 01/27/12 1337  CKTOTAL -- -- --  CKMB -- -- --  CKMBINDEX -- -- --  TROPONINI <0.30 <0.30 <0.30   BNP: No results found for this basename: PROBNP:3 in the last 72 hours D-Dimer: No results found for this basename:  DDIMER:2 in the last 72 hours CBG: No results found for this basename: GLUCAP:6 in the last 72 hours Hemoglobin A1C: No results found for this basename: HGBA1C in the last 72 hours Fasting Lipid Panel: No results found for this basename: CHOL,HDL,LDLCALC,TRIG,CHOLHDL,LDLDIRECT in the last 72 hours Thyroid Function Tests: No results found for this basename: TSH,T4TOTAL,FREET4,T3FREE,THYROIDAB in the last 72 hours Anemia Panel: No results found for this basename: VITAMINB12,FOLATE,FERRITIN,TIBC,IRON,RETICCTPCT in the last 72 hours Coagulation: No results found for this basename: LABPROT:2,INR:2 in the last 72 hours Urine Drug Screen: Drugs of Abuse  No results found for this basename: labopia, cocainscrnur, labbenz, amphetmu, thcu, labbarb    Alcohol Level: No results found for this basename: ETH:2 in the last 72 hours Urinalysis: No results found for this basename: COLORURINE:2,APPERANCEUR:2,LABSPEC:2,PHURINE:2,GLUCOSEU:2,HGBUR:2,BILIRUBINUR:2,KETONESUR:2,PROTEINUR:2,UROBILINOGEN:2,NITRITE:2,LEUKOCYTESUR:2 in the last 72 hours  No results found for this or any previous visit (from the past 240 hour(s)).  Studies/Results: Dg Chest 2 View  01/27/2012  *RADIOLOGY REPORT*  Clinical Data: Weakness.  Lung cancer.  CHEST - 2 VIEW  Comparison: CT scan 12/15/2011  Findings: Right power port catheter noted with tip projecting within the SVC. Density surrounding the port is probably related to the port itself rather than a new pulmonary nodule.  Left lower lobe lung nodules are again noted, but are much more conspicuous on CT.  There partially obscured by the  hila on the frontal projection.  Atherosclerotic aortic arch noted.  [name] shot projects over the right chest.  No pleural effusion observed.  Previous nodule medially in the superior segment right lower lobe is not visible.  IMPRESSION: 1.  Left lower lobe lung nodules are faintly apparent.  Previous lesion in the superior segment right lower lobe  is not well visualized on conventional radiography.  Valid size comparison would probably require a repeat CT.  2.  Right power port catheter tip projects in the SVC.  Vague density projecting over the port reservoir is probably related to the port itself rather than a new underlying pulmonary nodule.   Original Report Authenticated By: Dellia Cloud, M.D.     Medications: Scheduled Meds:   . aspirin EC  81 mg Oral Daily  . LORazepam  0.5 mg Intravenous Once  . ondansetron  4 mg Intravenous Once  . sodium chloride  2,000 mL Intravenous Once  . sodium chloride  3 mL Intravenous Q12H  . DISCONTD: sodium chloride   Intravenous STAT   Continuous Infusions:   . sodium chloride 100 mL/hr at 01/27/12 2342   PRN Meds:.prochlorperazine, sodium chloride, zolpidem, DISCONTD: temazepam  Assessment/Plan:  Principal Problem:  1. Dehydration  2. Metastatic non small cell lung CA s/p 4th cycle of Carbo/Taxol in early October  3. Weakness generalized  4. Peripheral neuropathy: chemo induced  5. Adult failure to thrive  6. Thrombocytopenia: induced by chemo Plan: Continue IVF today Check UA and urine Cx Nutrition consult Ensure  Physical therapy eval ? SNF vs HH care Oncology notified Hold ASA due to thrombocytopenia     Time spent coordinating care:   LOS: 1 day   [facility] Pager: [phone] 01/28/2012, 7:30 AM

## 2012-01-28 NOTE — Progress Notes (Signed)
OT Cancellation Note  Patient Details Name: Austin [name] MRN: [id number] DOB: 07-Jun-1933   Cancelled Treatment:    Reason Eval/Treat Not Completed: Pt. with another discipline.  Will re-attempt  Boykin Reaper [phone] 01/28/2012, 4:14 PM

## 2012-01-28 NOTE — Progress Notes (Signed)
Physical Therapy Evaluation Patient Details Name: Austin [name] MRN: [id number] DOB: 1933/11/04 Today's Date: 01/28/2012 Time: 4098-1191 PT Time Calculation (min): 28 min  PT Assessment / Plan / Recommendation Clinical Impression  Pt is 78 yo male who presented with acute weakness due to chemo for lung cancer. Pt has had similar episodes with previous chemo and family is concerned that he cannot tolerate last 2 chemo sessions.  They are questioning home with palliative care vs continuing with chemo and who would provide care for him. His sister is scheduled to have shoulder surgery the same day as his next chemo treatment. If he is going to continue chemo, I would recommend d/c to SNF for skilled care throughout end of chemo.  PT will continue to follow to help increase strength, mobility, and independence.    PT Assessment  Patient needs continued PT services    Follow Up Recommendations  Post acute inpatient    Does the patient have the potential to tolerate intense rehabilitation   No, Recommend SNF  Barriers to Discharge Decreased caregiver support      Equipment Recommendations  Rolling walker with 5" wheels    Recommendations for Other Services     Frequency Min 3X/week    Precautions / Restrictions Precautions Precautions: Fall Precaution Comments: pt's sister reports he sometimes falls bkwds Restrictions Weight Bearing Restrictions: No   Pertinent Vitals/Pain No c/o pain, though family mentioned chronic pain from chemo      Mobility  Bed Mobility Bed Mobility: Supine to Sit Supine to Sit: 4: Min assist Details for Bed Mobility Assistance: slow mvmt Transfers Transfers: Sit to Stand;Stand to Sit Sit to Stand: 4: Min assist Stand to Sit: 4: Min assist Details for Transfer Assistance: min A to steady pt Ambulation/Gait Ambulation/Gait Assistance: 4: Min assist Ambulation Distance (Feet): 100 Feet Assistive device: Rolling walker Ambulation/Gait Assistance  Details: min A to steady, pt with slow gait, 5' without RW, more unsteady Gait Pattern: Step-through pattern;Decreased stride length Stairs: No Wheelchair Mobility Wheelchair Mobility: No    Shoulder Instructions     Exercises     PT Diagnosis: Difficulty walking;Generalized weakness  PT Problem List: Decreased strength;Decreased activity tolerance;Decreased balance;Decreased mobility;Decreased knowledge of use of DME;Decreased knowledge of precautions PT Treatment Interventions: DME instruction;Gait training;Stair training;Functional mobility training;Therapeutic activities;Therapeutic exercise;Patient/family education   PT Goals Acute Rehab PT Goals PT Goal Formulation: With patient/family Time For Goal Achievement: 02/11/12 Potential to Achieve Goals: Fair Pt will go Supine/Side to Sit: with modified independence PT Goal: Supine/Side to Sit - Progress: Goal set today Pt will go Sit to Supine/Side: with modified independence PT Goal: Sit to Supine/Side - Progress: Goal set today Pt will go Sit to Stand: with modified independence PT Goal: Sit to Stand - Progress: Goal set today Pt will go Stand to Sit: with modified independence PT Goal: Stand to Sit - Progress: Goal set today Pt will Ambulate: >150 feet;with modified independence;with least restrictive assistive device PT Goal: Ambulate - Progress: Goal set today Pt will Go Up / Down Stairs: 1-2 stairs;with modified independence PT Goal: Up/Down Stairs - Progress: Goal set today Pt will Perform Home Exercise Program: Independently PT Goal: Perform Home Exercise Program - Progress: Goal set today  Visit Information  Last PT Received On: 01/28/12 Assistance Needed: +1    Subjective Data  Subjective: pt's family questioning whether he can tolerate last 2 rounds of chemo, pt wants to do them Patient Stated Goal: return home   Prior Functioning  Home  Living Lives With: Alone Type of Home: House Home Access: Stairs to  enter Entrance Stairs-Number of Steps: 2 Home Layout: One level Home Adaptive Equipment: None Additional Comments: pt has to descend a hill and navigate 2 steps to enter home.  His sister is his only relative and she lives an hour away at [facility].  She has been trying to help but she is set to have shoulder surgery 10/22 which is the date of his next chemo.  She reports that he has become this week before and chemo was stopped and restarted.  She feels that he is unsafe to go back home but she cannot help him currently and she is questioning if he can survive the last chemo treatments or if he should go home with palliative care.  He thinks he could live with some friends in [place] for awhile but sister does not think this is a realistic option. Prior Function Level of Independence: Independent Able to Take Stairs?: Yes Driving: Yes Vocation: Retired Musician: No difficulties    Cognition  Overall Cognitive Status: Appears within functional limits for tasks assessed/performed Arousal/Alertness: Awake/alert Orientation Level: Appears intact for tasks assessed Behavior During Session: [facility] for tasks performed Cognition - Other Comments: pt did not verbalize much through evaluation so appears cognitively in tact but question how much chemo has affected mental capacity    Extremity/Trunk Assessment Right Lower Extremity Assessment RLE ROM/Strength/Tone: Deficits RLE ROM/Strength/Tone Deficits: generalized weakness noted, grossly 3/5 throughout RLE Sensation: WFL - Light Touch RLE Coordination: WFL - gross motor Left Lower Extremity Assessment LLE ROM/Strength/Tone: Deficits LLE ROM/Strength/Tone Deficits: generalized weakness noted, grossly 3/5 throughout LLE Sensation: WFL - Light Touch LLE Coordination: WFL - gross motor Trunk Assessment Trunk Assessment: Normal   Balance Balance Balance Assessed: Yes Dynamic Standing Balance Dynamic Standing - Balance  Support: No upper extremity supported;During functional activity Dynamic Standing - Level of Assistance: 4: Min assist  End of Session PT - End of Session Equipment Utilized During Treatment: Gait belt Activity Tolerance: Patient limited by fatigue Patient left: in chair;with call bell/phone within reach;with family/visitor present Nurse Communication: Mobility status  GP Functional Assessment Tool Used: clinical judgement Functional Limitation: Mobility: Walking and moving around Mobility: Walking and Moving Around Current Status ([id number]): At least 20 percent but less than 40 percent impaired, limited or restricted Mobility: Walking and Moving Around Goal Status [phone]): At least 1 percent but less than 20 percent impaired, limited or restricted   Lyanne Co, PT  Acute Rehab Services  [phone]  Lyanne Co 01/28/2012, 12:15 PM

## 2012-01-28 NOTE — Care Management Note (Signed)
    Page 1 of 2   01/29/2012     2:03:28 PM   CARE MANAGEMENT NOTE 01/29/2012  Patient:  KYREN, KNICK   Account Number:  [id number]  Date Initiated:  01/28/2012  Documentation initiated by:  Xzayvion Vaeth  Subjective/Objective Assessment:   PT ADM ON 01/27/12 WITH WEAKNESS, LUNG CA.  CURRENTLY RECEIVING CHEMO TREATMENTS.  PTA, PT INDEPENDENT, LIVES ALONE.  HE HAS SUPPORTIVE SISTER, AT BEDSIDE.     Action/Plan:   MET WITH PT AND SISTER TO DISCUSS DC PLANS.  SISTER LIVES ONE HOUR AWAY, AND IS SCHEDULED FOR SURGERY ON DAY OF PT'S NEXT CHEMO.  PT STATES NOT ABLE TO CARE FOR HIMSELF SOME DAYS AT HOME.   Anticipated DC Date:  01/29/2012   Anticipated DC Plan:  ASSISTED LIVING / REST HOME  In-house referral  Clinical Social Worker      DC Associate Professor  CM consult      [facility] Choice  HOME HEALTH   Choice offered to / List presented to:  C-1 Patient   DME arranged  WALKER - ROLLING  BEDSIDE COMMODE  TUB BENCH      DME agency  [facility]     HH arranged  HH-1 RN  HH-2 PT  HH-3 OT  HH-4 NURSE'S AIDE      HH agency  [facility]   Status of service:  Completed, signed off Medicare Important Message given?   (If response is "NO", the following Medicare IM given date fields will be blank) Date Medicare IM given:   Date Additional Medicare IM given:    Discharge Disposition:  HOME W HOME HEALTH SERVICES  Per UR Regulation:  Reviewed for med. necessity/level of care/duration of stay  If discussed at Long Length of Stay Meetings, dates discussed:    Comments:  01/29/12 Kelsie Kramp, RN,BSN [phone]  1300 PLAN DC HOME TODAY WITH [facility]/DME.  MET WITH PT TO DISCUSS DISCHARGE PLANS.  PT GIVEN LIST OF [place] HH AGENCIES; REFERRAL TO [facility], PER PT CHOICE FOR HH/DME FOLLOW UP.  START OF CARE 24-48H POST DC DATE.   01/28/12 Edward Guthmiller,RN,BSN [phone] PHYSICAL THERAPIST CONSULT TODAY: RECOMMENDING SNF PLACEMENT, HOWEVER PT IS  AN OBSERVATION CASE, AND PT MUST HAVE 3 DAY QUALIFYING INPATIENT STAY FOR SNF PLACEMENT. CSW CONSULTED TO DISCUSS DC OPTIONS WITH PT AND SISTER, IE) POSS. ASSISTED LIVING FACILITY PLACEMENT FOR 30-60 DAYS DURING REMAINING CHEMO TREATMENTS.  CSW TO FOLLOW.  CASE MANAGER TO ASSIST AS NEEDED WITH DISPOSITION.

## 2012-01-28 NOTE — Progress Notes (Signed)
INITIAL ADULT NUTRITION ASSESSMENT Date: 01/28/2012   Time: 12:30 PM  INTERVENTION:  Ensure Complete twice daily (350 kcals, 13 gm protein per 8 fl oz bottle) RD to follow for nutrition care plan  DOCUMENTATION CODES Per approved criteria  -Non-severe (moderate) malnutrition in the context of chronic illness   Reason for Assessment: Consult, Malnutrition Screening Tool Report  ASSESSMENT: Male 78 y.o.  Dx: Dehydration  Hx:  Past Medical History  Diagnosis Date  . Stroke     1980S  . Shortness of breath     WITH COUGH  . Blood transfusion   . Cancer 06/2011    lung  . Cancer of lung     Related Meds:     . aspirin EC  81 mg Oral Daily  . feeding supplement  1 Container Oral TID BM  . LORazepam  0.5 mg Intravenous Once  . nicotine  14 mg Transdermal Daily  . ondansetron  4 mg Intravenous Once  . sodium chloride  2,000 mL Intravenous Once  . sodium chloride  3 mL Intravenous Q12H  . DISCONTD: sodium chloride   Intravenous STAT    Ht: 5\' 10"  (177.8 cm)  Wt: 165 lb 12.6 oz (75.2 kg)  Ideal Wt: 75.4 kg % Ideal Wt: 99%  Usual Wt: 177 lb -- June 2013 % Usual Wt: 93%  Body mass index is 23.79 kg/(m^2).  Food/Nutrition Related Hx: recent weight lost without trying per admission nutrition screen  Labs:  CMP     Component Value Date/Time   NA 131* 01/28/2012 0500   NA 135* 01/20/2012 1121   K 3.9 01/28/2012 0500   K 3.6 01/20/2012 1121   CL 99 01/28/2012 0500   CL 102 01/20/2012 1121   CO2 23 01/28/2012 0500   CO2 22 01/20/2012 1121   GLUCOSE 86 01/28/2012 0500   GLUCOSE 95 01/20/2012 1121   BUN 9 01/28/2012 0500   BUN 13.0 01/20/2012 1121   CREATININE 0.69 01/28/2012 0500   CREATININE 0.8 01/20/2012 1121   CALCIUM 8.4 01/28/2012 0500   CALCIUM 9.0 01/20/2012 1121   PROT 6.6 01/27/2012 1337   PROT 6.9 01/20/2012 1121   ALBUMIN 3.4* 01/27/2012 1337   ALBUMIN 3.5 01/20/2012 1121   AST 21 01/27/2012 1337   AST 29 01/20/2012 1121   ALT 29 01/27/2012 1337   ALT 31  01/20/2012 1121   ALKPHOS 133* 01/27/2012 1337   ALKPHOS 128 01/20/2012 1121   BILITOT 0.8 01/27/2012 1337   BILITOT 0.60 01/20/2012 1121   GFRNONAA 90* 01/28/2012 0500   GFRAA >90 01/28/2012 0500     Intake/Output Summary (Last 24 hours) at 01/28/12 1232 Last data filed at 01/28/12 1211  Gross per 24 hour  Intake   2650 ml  Output    925 ml  Net   1725 ml    Diet Order: General  Supplements/Tube Feeding: Ensure Pudding 3 times daily  IVF:    sodium chloride Last Rate: 100 mL/hr at 01/27/12 2342    Estimated Nutritional Needs:   Kcal: 2000-2200 Protein: 100-110 gm Fluid: 2.0-2.2 L  Patient is s/p his 4th round of chemotherapy (metastatic lung cancer) with progressive generalized weakness and poor PO intake; patient is followed by outpatient [facility] RD -- notes reviewed; patient has had a 7% weight loss since June of 2013; does tolerate an oral diet but intake is limited; per sister, he "picks" at meals; he does try and drink 1 Ensure Plus a day.  Patient  meets criteria for non-severe (moderate) malnutrition in the context of chronic illness due to < 75% intake of estimated energy requirement for > 1 month and 7% weight loss x 3 months.  NUTRITION DIAGNOSIS: -Unintended Weight Loss  RELATED TO: catabolic illness & associated treatments  AS EVIDENCE BY: 7% weight loss  MONITORING/EVALUATION(Goals): Goal: Oral intake with meals & supplements to meet >/= 90% of estimated nutrition needs Monitor: PO & supplemental intake, weight, labs, I/O's  EDUCATION NEEDS: -No education needs identified at this time  Dietitian #: [phone]  Alger Memos 01/28/2012, 12:30 PM

## 2012-01-29 LAB — BASIC METABOLIC PANEL
BUN: 8 mg/dL (ref 6–23)
Chloride: 99 mEq/L (ref 96–112)
Creatinine, Ser: 0.75 mg/dL (ref 0.50–1.35)
GFR calc Af Amer: >90 mL/min (ref >90)
GFR calc non Af Amer: 87 mL/min — ABNORMAL LOW (ref >90)
Potassium: 3.6 mEq/L (ref 3.5–5.1)

## 2012-01-29 LAB — CBC
MCHC: 35.6 g/dL (ref 30.0–36.0)
MCV: 97.2 fL (ref 78.0–100.0)
Platelets: 52 10*3/uL — ABNORMAL LOW (ref 150–400)
RDW: 15.9 % — ABNORMAL HIGH (ref 11.5–15.5)
WBC: 22.6 10*3/uL — ABNORMAL HIGH (ref 4.0–10.5)

## 2012-01-29 LAB — URINE CULTURE
Colony Count: NO GROWTH
Culture: NO GROWTH

## 2012-01-29 MED ORDER — HEPARIN SOD (PORK) LOCK FLUSH 100 UNIT/ML IV SOLN
500.0000 [IU] | INTRAVENOUS | Status: AC | PRN
  Administered 2012-01-29: 500 [IU]

## 2012-01-29 MED ORDER — GABAPENTIN 300 MG PO CAPS: 300.0000 mg | ORAL_CAPSULE | Freq: Every day | ORAL | Status: DC

## 2012-01-29 MED ORDER — ENSURE COMPLETE PO LIQD: 237.0000 mL | Freq: Two times a day (BID) | ORAL | Status: DC

## 2012-01-29 NOTE — Clinical Social Work Placement (Signed)
Clinical Social Work Department BRIEF PSYCHOSOCIAL ASSESSMENT 01/29/2012  Patient:  Austin [name], Austin [name]     Account Number:  [id number]     Admit date:  01/27/2012  Clinical Social Worker:  Thomasene Mohair  Date/Time:  01/29/2012 11:00 AM  Referred by:  RN  Date Referred:  01/28/2012 Referred for  ALF Placement   Other Referral:   Interview type:  Patient Other interview type:   sister at bedside    PSYCHOSOCIAL DATA Living Status:  ALONE Admitted from facility:   Level of care:   Primary support name:  Loistine Chance [phone] Primary support relationship to patient:  SIBLING Degree of support available:   strong emotional, limited physical    CURRENT CONCERNS Current Concerns  Post-Acute Placement  Adjustment to Illness  Financial Resources   Other Concerns:    SOCIAL WORK ASSESSMENT / PLAN CSW was referred to pt to assist with dc planning, specifically potential placement. Pt lives alone, has 2 children (1 out of state, 1 in [place]) and a sister. Pt and his sister are 2 or 9 children, so there are several nieces and nephews but none nearby.  Pt shared that he is a "loner" and does not like to ask for help, but has spent years helping others in his community when they have asked for it.  CSW discussed the possibilities of placement at ALF and SNF with regards to medicare and [employer]. Pt would be private play at either facility types if he were to go short-term which is the most he would agree to at this time.  Pt is not able to pay privately, but will consider it after the next chemo tx. CSW provided extensive emotional support to Pt and Pt's sister in regards to navigating what the sister wants for her brother and what the pt is agreeable to at this time.  Plan is to maximize home health options at dc. CSW will contact area ALF for a respite rate and follow up with Pt's LCSW at the [facility] for continuation of care in addressing pt's psychosocial needs.    Assessment/plan status:  No Further Intervention Required Other assessment/ plan:   Pt scheduled for dc.   Information/referral to community resources:   Meals on wheels, ALF    PATIENT'S/FAMILY'S RESPONSE TO PLAN OF CARE: Pt is slowly getting used to asking his support network for help at home. CSW advised Pt and his sister how to be specific with people on his needs and realistic as well. Pt was tearful at different points in the conversation. Pt's sister shared that she will try to keep supporting the pt as best as she can and that she is hopeful that he will make a full recovery. Pt's sister is supportive especially with helping Pt organize documents and stated that she is going to help him organize his medications as well.   Frederico Hamman, LCSW [phone]

## 2012-01-29 NOTE — Progress Notes (Signed)
Physical Therapy Treatment Patient Details Name: Austin [name] MRN: [id number] DOB: April 26, 1933 Today's Date: 01/29/2012 Time: 4098-1191 PT Time Calculation (min): 25 min  PT Assessment / Plan / Recommendation Comments on Treatment Session  Pt admitted with weakness due to ongoing chemo for diagnosis of lung cancer. Pt with improved mobility today, but still with significantly decreased endurance without assistance at home. Feel pt would be safest d/c'ing to facility in order to have 24 hour assist. However, pt/family choosing to d/c home. Thus, spoke with case manager about d/c recommendations of HHPT, RW, 3-N-1, tub transfer bench, and w/c for long distances/entrance to home with rooted path. Discussed all recommendations with pt and pt's sister as well.    Follow Up Recommendations  Home health PT     Does the patient have the potential to tolerate intense rehabilitation     Barriers to Discharge        Equipment Recommendations  Rolling walker with 5" wheels;Tub/shower bench;3 in 1 bedside comode;Wheelchair (measurements)    Recommendations for Other Services    Frequency Min 3X/week   Plan Discharge plan needs to be updated;Frequency remains appropriate    Precautions / Restrictions Precautions Precautions: Fall Precaution Comments: pt's sister reports he sometimes falls bkwds Restrictions Weight Bearing Restrictions: No   Pertinent Vitals/Pain None    Mobility  Bed Mobility Bed Mobility: Not assessed Supine to Sit: 5: Supervision;With rails Sitting - Scoot to Edge of Bed: 5: Supervision Sit to Supine: 5: Supervision;With rail Transfers Transfers: Sit to Stand;Stand to Sit Sit to Stand: 5: Supervision;From toilet;From bed Stand to Sit: 5: Supervision;To bed Details for Transfer Assistance: Verbal cues for safety with supervision due to decreased endurance. Ambulation/Gait Ambulation/Gait Assistance: 4: Min guard Ambulation Distance (Feet): 200 Feet Assistive  device: Other (Comment) (Pushing IV pole.) Ambulation/Gait Assistance Details: Guarding for balance and due to decreased endurance. Cues for safety. Gait Pattern: Decreased stride length Stairs: No Wheelchair Mobility Wheelchair Mobility: No    Exercises     PT Diagnosis:    PT Problem List:   PT Treatment Interventions:     PT Goals Acute Rehab PT Goals PT Goal Formulation: With patient/family Time For Goal Achievement: 02/11/12 Potential to Achieve Goals: Fair PT Goal: Sit to Stand - Progress: Progressing toward goal PT Goal: Stand to Sit - Progress: Progressing toward goal PT Goal: Ambulate - Progress: Progressing toward goal  Visit Information  Last PT Received On: 01/29/12 Assistance Needed: +1    Subjective Data  Subjective: "I am just ready to go home." Patient Stated Goal: return home   Cognition  Overall Cognitive Status: Appears within functional limits for tasks assessed/performed Arousal/Alertness: Awake/alert Orientation Level: Appears intact for tasks assessed Behavior During Session: [facility] for tasks performed    Balance  Balance Balance Assessed: No  End of Session PT - End of Session Equipment Utilized During Treatment: Gait belt Activity Tolerance: Patient tolerated treatment well;Patient limited by fatigue Patient left: in bed;with call bell/phone within reach;with family/visitor present (Sitting EOB.) Nurse Communication: Mobility status   GP Functional Assessment Tool Used: clinical judgement Functional Limitation: Mobility: Walking and moving around Mobility: Walking and Moving Around Current Status ([id number]): At least 20 percent but less than 40 percent impaired, limited or restricted Mobility: Walking and Moving Around Goal Status [phone]): At least 1 percent but less than 20 percent impaired, limited or restricted Mobility: Walking and Moving Around Discharge Status [phone]): At least 20 percent but less than 40 percent impaired, limited or  restricted  Korena Nass M 01/29/2012, 1:16 PM  01/29/2012 Cephus Shelling, PT, DPT [phone]

## 2012-01-29 NOTE — Progress Notes (Addendum)
[facility]             Progress Note   Subjective: Feels better, eating much better and balance/gait improved too  Objective: Vital signs in last 24 hours: Temp:  [98.1 F (36.7 C)-98.6 F (37 C)] 98.1 F (36.7 C) (10/10 0349) Pulse Rate:  [101-115] 115  (10/10 0349) Resp:  [16-18] 18  (10/10 0349) BP: (130-152)/(75-80) 149/75 mmHg (10/10 0349) SpO2:  [96 %-99 %] 96 % (10/10 0349) Weight:  [73.5 kg (162 lb 0.6 oz)] 73.5 kg (162 lb 0.6 oz) (10/10 0349) Weight change: -1.7 kg (-3 lb 12 oz) Last BM Date: 01/28/12  Intake/Output from previous day: 10/09 0701 - 10/10 0700 In: 960 [P.O.:960] Out: -      Physical Exam: General: Alert, awake, oriented x3, in no acute distress. HEENT: No bruits, no goiter. Heart: Regular rate and rhythm, without murmurs, rubs, gallops. Lungs: Clear to auscultation bilaterally. Abdomen: Soft, nontender, nondistended, positive bowel sounds. Extremities: No clubbing cyanosis or edema with positive pedal pulses. Neuro: Grossly intact, nonfocal.    Lab Results: Basic Metabolic Panel:  Basename 01/29/12 0510 01/28/12 0500  NA 132* 131*  K 3.6 3.9  CL 99 99  CO2 25 23  GLUCOSE 92 86  BUN 8 9  CREATININE 0.75 0.69  CALCIUM 9.2 8.4  MG -- --  PHOS -- --   Liver Function Tests:  Basename 01/27/12 1337  AST 21  ALT 29  ALKPHOS 133*  BILITOT 0.8  PROT 6.6  ALBUMIN 3.4*   No results found for this basename: LIPASE:2,AMYLASE:2 in the last 72 hours No results found for this basename: AMMONIA:2 in the last 72 hours CBC:  Basename 01/29/12 0510 01/28/12 0500 01/27/12 1337  WBC 22.6* 8.0 --  NEUTROABS -- -- 0.7*  HGB 10.0* 9.6* --  HCT 28.1* 26.7* --  MCV 97.2 96.7 --  PLT 52* 33* --   Cardiac Enzymes:  Basename 01/28/12 1117 01/28/12 0542 01/27/12 2342  CKTOTAL -- -- --  CKMB -- -- --  CKMBINDEX -- -- --  TROPONINI <0.30 <0.30 <0.30   BNP: No results found for this basename: PROBNP:3 in the last 72  hours D-Dimer: No results found for this basename: DDIMER:2 in the last 72 hours CBG: No results found for this basename: GLUCAP:6 in the last 72 hours Hemoglobin A1C: No results found for this basename: HGBA1C in the last 72 hours Fasting Lipid Panel: No results found for this basename: CHOL,HDL,LDLCALC,TRIG,CHOLHDL,LDLDIRECT in the last 72 hours Thyroid Function Tests:  Basename 01/27/12 2359  TSH 2.196  T4TOTAL --  FREET4 --  T3FREE --  THYROIDAB --   Anemia Panel: No results found for this basename: VITAMINB12,FOLATE,FERRITIN,TIBC,IRON,RETICCTPCT in the last 72 hours Coagulation: No results found for this basename: LABPROT:2,INR:2 in the last 72 hours Urine Drug Screen: Drugs of Abuse  No results found for this basename: labopia,  cocainscrnur,  labbenz,  amphetmu,  thcu,  labbarb    Alcohol Level: No results found for this basename: ETH:2 in the last 72 hours Urinalysis:  Basename 01/28/12 0750  COLORURINE YELLOW  LABSPEC 1.009  PHURINE 7.5  GLUCOSEU NEGATIVE  HGBUR NEGATIVE  BILIRUBINUR NEGATIVE  KETONESUR NEGATIVE  PROTEINUR NEGATIVE  UROBILINOGEN 2.0*  NITRITE NEGATIVE  LEUKOCYTESUR NEGATIVE    No results found for this or any previous visit (from the past 240 hour(s)).  Studies/Results: Dg Chest 2 View  01/27/2012  *RADIOLOGY REPORT*  Clinical Data: Weakness.  Lung cancer.  CHEST - 2 VIEW  Comparison: CT scan 12/15/2011  Findings: Right power port catheter noted with tip projecting within the SVC. Density surrounding the port is probably related to the port itself rather than a new pulmonary nodule.  Left lower lobe lung nodules are again noted, but are much more conspicuous on CT.  There partially obscured by the hila on the frontal projection.  Atherosclerotic aortic arch noted.  [name] shot projects over the right chest.  No pleural effusion observed.  Previous nodule medially in the superior segment right lower lobe is not visible.  IMPRESSION: 1.  Left  lower lobe lung nodules are faintly apparent.  Previous lesion in the superior segment right lower lobe is not well visualized on conventional radiography.  Valid size comparison would probably require a repeat CT.  2.  Right power port catheter tip projects in the SVC.  Vague density projecting over the port reservoir is probably related to the port itself rather than a new underlying pulmonary nodule.   Original Report Authenticated By: Dellia Cloud, M.D.     Medications: Scheduled Meds:    . feeding supplement  237 mL Oral BID BM  . nicotine  14 mg Transdermal Daily  . sodium chloride  3 mL Intravenous Q12H  . DISCONTD: aspirin EC  81 mg Oral Daily  . DISCONTD: feeding supplement  1 Container Oral TID BM   Continuous Infusions:    . sodium chloride Stopped (01/28/12 1646)   PRN Meds:.prochlorperazine, sodium chloride, zolpidem  Assessment/Plan:  Principal Problem:  1. Dehydration  2. Metastatic non small cell lung CA s/p 4th cycle of Carbo/Taxol in early October  3. Weakness generalized  4. Peripheral neuropathy: chemo induced  5. Adult failure to thrive  6. Thrombocytopenia: induced by chemo Plan: Continue IVF today Check UA unremarkable Nutrition consult Ensure  Physical therapy eval noted Oncology notified Hold ASA due to thrombocytopenia Medically stable for DC CM/CSW working on ALF     Time spent coordinating care:   LOS: 2 days   [facility] Pager: [phone] 01/29/2012, 7:51 AM

## 2012-01-29 NOTE — Progress Notes (Signed)
Pt DCing home with family and home health services and equipment.  Instructions given and reviewed.  Follow up appointments in place.  All questions answered.

## 2012-01-29 NOTE — Discharge Summary (Signed)
Physician Discharge Summary  Patient ID: Austin [name] MRN: [id number] DOB/AGE: 76-Aug-1935 76 y.o.  Admit date: 01/27/2012 Discharge date: 01/29/2012  Primary Care Physician:  [facility], Austin Land, MD   Discharge Diagnoses:    Principal Problem:  *Dehydration Active Problems:  Metastatic non small cell lumg CA  Weakness generalized  on chemotherapy  Adult failure to thrive Leukocytosis      Medication List     As of 01/29/2012 12:38 PM    STOP taking these medications         ALKA-SELTZER PLUS COLD PO      TAKE these medications         aspirin 81 MG tablet   Take 81 mg by mouth daily.      feeding supplement Liqd   Take 237 mLs by mouth 2 (two) times daily between meals.      gabapentin 300 MG capsule   Commonly known as: NEURONTIN   Take 1 capsule (300 mg total) by mouth at bedtime.      lidocaine-prilocaine cream   Commonly known as: EMLA   Apply topically as needed.      prochlorperazine 10 MG tablet   Commonly known as: COMPAZINE   Take 1 tablet (10 mg total) by mouth every 6 (six) hours as needed.      temazepam 15 MG capsule   Commonly known as: RESTORIL   Take 1 capsule (15 mg total) by mouth at bedtime as needed for sleep.         Disposition and Follow-up:  Dr.Mohamed in 2 weeka PCP in 1 week  Consults: Dr.Mohamed   Significant Diagnostic Studies:  Dg Chest 2 View  01/27/2012  *RADIOLOGY REPORT*  Clinical Data: Weakness.  Lung cancer.  CHEST - 2 VIEW  Comparison: CT scan 12/15/2011  Findings: Right power port catheter noted with tip projecting within the SVC. Density surrounding the port is probably related to the port itself rather than a new pulmonary nodule.  Left lower lobe lung nodules are again noted, but are much more conspicuous on CT.  There partially obscured by the hila on the frontal projection.  Atherosclerotic aortic arch noted.  Austin [name] shot projects over the right chest.  No pleural effusion observed.  Previous nodule medially in  the superior segment right lower lobe is not visible.  IMPRESSION: 1.  Left lower lobe lung nodules are faintly apparent.  Previous lesion in the superior segment right lower lobe is not well visualized on conventional radiography.  Valid size comparison would probably require a repeat CT.  2.  Right power port catheter tip projects in the SVC.  Vague density projecting over the port reservoir is probably related to the port itself rather than a new underlying pulmonary nodule.   Original Report Authenticated By: Austin [name], M.D.     Brief H and P: 78 yo male lung cancer s/p 4th round of chemo comes in with prog worsening gen weakness and poor po intake. deneis f/n/v/d/cp/sob/cough/or abd pain. No le edema or swelling. Has received over a liter ivf ns in ED and already feels better. Next chemo tx in 2 weeks. No rashes on body.     Hospital Course:  1. Dehydration  2. Metastatic non small cell lung CA s/p 4th cycle of Carbo/Taxol in early October  3. Weakness generalized  4. Peripheral neuropathy: chemo induced  5. Adult failure to thrive  6. Thrombocytopenia: induced by chemo  7. Leukocytosis secondary to Neulasta following chemotherapy received  last week, clinically no evidence of infection Plan:  He was treated with IVF, seen by Dietician in consultation, slowly starting eating better and drinking ensure and his gait and balance improved some as well He was seen by Pt /Ot who originally recommended SNF/ALF but patient declined this in part due to the cost involved He will go home with maximal assistance that can be provided by home health services namely PT/OT/RN/Aide and DME for home use Oncology input noted    Time spent on Discharge: Signed: [place] [name] [facility]  01/29/2012, 12:38 PM

## 2012-01-29 NOTE — Evaluation (Signed)
Occupational Therapy Evaluation Patient Details Name: Austin [name] MRN: [id number] DOB: [birth year] Today's Date: 01/29/2012 Time: 7829-5621 OT Time Calculation (min): 26 min  OT Assessment / Plan / Recommendation Clinical Impression  This 78 y.o. male admitted with dehydration due to poor p.o. intake secondary to chemo.  Pt. will presents with generalized weakness and is deconditioned.  Pt. will benefit from OT to address the below listed deficits to allow pt. to return to modified independent level with BADLs.  Recommend SNF/ALF as pt. is unable to consistently perform IADLs independently    OT Assessment  Patient needs continued OT Services    Follow Up Recommendations  Skilled nursing facility;Supervision/Assistance - 24 hour    Barriers to Discharge Decreased caregiver support    Equipment Recommendations  Tub/shower bench;Rolling walker with 5" wheels;3 in 1 bedside comode    Recommendations for Other Services    Frequency  Min 2X/week    Precautions / Restrictions Precautions Precautions: Fall Precaution Comments: pt's sister reports he sometimes falls bkwds Restrictions Weight Bearing Restrictions: No       ADL  Eating/Feeding: Simulated;Independent Where Assessed - Eating/Feeding: Edge of bed Grooming: Simulated;Wash/dry hands;Wash/dry face;Teeth care;Supervision/safety Where Assessed - Grooming: Supported standing Upper Body Bathing: Simulated;Set up Where Assessed - Upper Body Bathing: Unsupported sitting Lower Body Bathing: Simulated;Supervision/safety Where Assessed - Lower Body Bathing: Supported sit to stand Upper Body Dressing: Simulated;Set up Where Assessed - Upper Body Dressing: Unsupported sitting Lower Body Dressing: Simulated;Supervision/safety Where Assessed - Lower Body Dressing: Supported sit to stand Toilet Transfer: Buyer, retail Method: Sit to Barista: Regular height toilet Toileting -  Clothing Manipulation and Hygiene: Simulated;Independent Where Assessed - Toileting Clothing Manipulation and Hygiene: Sit on 3-in-1 or toilet Transfers/Ambulation Related to ADLs: supervision ADL Comments: Pt fatigues easily and requires 3 seated rest breaks in 15 mins.  Sister and pt report that pt has decided to go to SNF as he is not able to function independently if he undergoes chemo   (dyspnea 3/4)    OT Diagnosis: Generalized weakness  OT Problem List: Decreased strength;Decreased activity tolerance;Decreased knowledge of use of DME or AE;Cardiopulmonary status limiting activity OT Treatment Interventions: Self-care/ADL training;Therapeutic exercise;DME and/or AE instruction;Therapeutic activities;Patient/family education   OT Goals Acute Rehab OT Goals OT Goal Formulation: With patient Time For Goal Achievement: 02/05/12 Potential to Achieve Goals: Good ADL Goals Pt Will Perform Grooming: with modified independence;Standing at sink ADL Goal: Grooming - Progress: Goal set today Pt Will Perform Upper Body Bathing: with modified independence;Standing at sink ADL Goal: Upper Body Bathing - Progress: Goal set today Pt Will Perform Lower Body Bathing: with modified independence;Sit to stand from chair;Sit to stand from bed ADL Goal: Lower Body Bathing - Progress: Goal set today Pt Will Perform Upper Body Dressing: with modified independence;Sitting, chair;Sitting, bed ADL Goal: Upper Body Dressing - Progress: Goal set today Pt Will Perform Lower Body Dressing: with modified independence;Sit to stand from chair;Sit to stand from bed ADL Goal: Lower Body Dressing - Progress: Goal set today Pt Will Transfer to Toilet: with modified independence;3-in-1;Comfort height toilet;Ambulation ADL Goal: Toilet Transfer - Progress: Goal set today Pt Will Perform Toileting - Clothing Manipulation: with modified independence;Standing ADL Goal: Toileting - Clothing Manipulation - Progress: Goal set  today Pt Will Perform Tub/Shower Transfer: with supervision;Ambulation;Transfer tub bench ADL Goal: Tub/Shower Transfer - Progress: Goal set today  Visit Information  Last OT Received On: 01/29/12 Assistance Needed: +1    Subjective Data  Subjective: I'm going  to go to a facility Patient Stated Goal: To get better   Prior Functioning     Home Living Lives With: Alone Available Help at Discharge: Family;Available PRN/intermittently Type of Home: House Home Access: Stairs to enter [employer] of Steps: 2 Home Layout: One level Bathroom Shower/Tub: Engineer, manufacturing systems: Standard Home Adaptive Equipment: None Additional Comments: pt has to descend a hill and navigate 2 steps to enter home.  His sister is his only relative and she lives an hour away at [facility].  She has been trying to help but she is set to have shoulder surgery 10/22 which is the date of his next chemo.  She reports that he has become this week before and chemo was stopped and restarted.  She feels that he is unsafe to go back home but she cannot help him currently and she is questioning if he can survive the last chemo treatments or if he should go home with palliative care.  He thinks he could live with some friends in [place] for awhile but sister does not think this is a realistic option. Prior Function Level of Independence: Independent;Needs assistance Needs Assistance: Meal Prep Meal Prep: Moderate Able to Take Stairs?: Yes Driving: Yes Vocation: Retired Musician: No difficulties Dominant Hand: Right         Vision/Perception Vision - Assessment Vision Assessment: Vision not tested   Cognition  Overall Cognitive Status: Appears within functional limits for tasks assessed/performed Arousal/Alertness: Awake/alert Orientation Level: Appears intact for tasks assessed Behavior During Session: [facility] for tasks performed    Extremity/Trunk Assessment  Right Upper Extremity Assessment RUE ROM/Strength/Tone: Within functional levels RUE Sensation: Deficits RUE Sensation Deficits: 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 LUE Coordination: WFL - gross motor     Mobility Bed Mobility Bed Mobility: Supine to Sit;Sitting - Scoot to Edge of Bed;Sit to Supine Supine to Sit: 5: Supervision;With rails Sitting - Scoot to Edge of Bed: 5: Supervision Sit to Supine: 5: Supervision;With rail Transfers Transfers: Sit to Stand;Stand to Sit Sit to Stand: 5: Supervision;With upper extremity assist;From bed Stand to Sit: 5: Supervision;With upper extremity assist;To bed Details for Transfer Assistance: Pt requires supervision due to decreased endurance     Shoulder Instructions     Exercise     Balance     End of Session OT - End of Session Activity Tolerance: Patient limited by fatigue Patient left: in bed;with call bell/phone within reach;with family/visitor present  GO Functional Limitation: Self care Self Care Current Status (Q6578): At least 1 percent but less than 20 percent impaired, limited or restricted Self Care Goal Status (I6962): 0 percent impaired, limited or restricted   Arvid Marengo M 01/29/2012, 11:02 AM

## 2012-02-03 ENCOUNTER — Other Ambulatory Visit (HOSPITAL_BASED_OUTPATIENT_CLINIC_OR_DEPARTMENT_OTHER): Payer: Medicare Other

## 2012-02-03 DIAGNOSIS — C3490 Malignant neoplasm of unspecified part of unspecified bronchus or lung: Secondary | ICD-10-CM

## 2012-02-03 LAB — CBC WITH DIFFERENTIAL/PLATELET
BASO%: 0.3 % (ref 0.0–2.0)
EOS%: 0 % (ref 0.0–7.0)
HCT: 31.1 % — ABNORMAL LOW (ref 38.4–49.9)
MCH: 36 pg — ABNORMAL HIGH (ref 27.2–33.4)
MCHC: 34.5 g/dL (ref 32.0–36.0)
MONO#: 2.6 10*3/uL — ABNORMAL HIGH (ref 0.1–0.9)
NEUT%: 73.5 % (ref 39.0–75.0)
RDW: 17.7 % — ABNORMAL HIGH (ref 11.0–14.6)
WBC: 16.5 10*3/uL — ABNORMAL HIGH (ref 4.0–10.3)
lymph#: 1.7 10*3/uL (ref 0.9–3.3)

## 2012-02-03 LAB — COMPREHENSIVE METABOLIC PANEL (CC13)
ALT: 24 U/L (ref 0–55)
AST: 23 U/L (ref 5–34)
Albumin: 3.4 g/dL — ABNORMAL LOW (ref 3.5–5.0)
CO2: 24 mEq/L (ref 22–29)
Calcium: 8.9 mg/dL (ref 8.4–10.4)
Chloride: 94 mEq/L — ABNORMAL LOW (ref 98–107)
Potassium: 4.2 mEq/L (ref 3.5–5.1)
Sodium: 127 mEq/L — ABNORMAL LOW (ref 136–145)
Total Protein: 6.4 g/dL (ref 6.4–8.3)

## 2012-02-10 ENCOUNTER — Telehealth: Payer: Self-pay | Admitting: Internal Medicine

## 2012-02-10 ENCOUNTER — Ambulatory Visit (HOSPITAL_BASED_OUTPATIENT_CLINIC_OR_DEPARTMENT_OTHER): Payer: Medicare HMO | Admitting: Physician Assistant

## 2012-02-10 ENCOUNTER — Other Ambulatory Visit: Payer: Medicare Other | Admitting: Lab

## 2012-02-10 ENCOUNTER — Ambulatory Visit: Payer: Medicare Other

## 2012-02-10 ENCOUNTER — Encounter: Payer: Medicare Other | Admitting: Nutrition

## 2012-02-10 ENCOUNTER — Encounter: Payer: Self-pay | Admitting: Physician Assistant

## 2012-02-10 VITALS — BP 143/72 | HR 96 | Temp 96.8°F | Resp 18 | Ht 70.0 in | Wt 167.5 lb

## 2012-02-10 DIAGNOSIS — C3490 Malignant neoplasm of unspecified part of unspecified bronchus or lung: Secondary | ICD-10-CM

## 2012-02-10 DIAGNOSIS — C3430 Malignant neoplasm of lower lobe, unspecified bronchus or lung: Secondary | ICD-10-CM

## 2012-02-10 MED ORDER — TEMAZEPAM 15 MG PO CAPS: 15.0000 mg | ORAL_CAPSULE | Freq: Every evening | ORAL | Status: DC | PRN

## 2012-02-10 NOTE — Telephone Encounter (Signed)
gv pt appt schedule for October and November.  °

## 2012-02-10 NOTE — Patient Instructions (Addendum)
Your chemotherapy will rescheduled to next week at your request You will follow up in 4 weeks prior to your next cycle of chemotherapy Continue weekly labs

## 2012-02-10 NOTE — Progress Notes (Signed)
[facility] [phone]   [phone]  OFFICE PROGRESS NOTE  [name], MD [address] [place] [address]  DIAGNOSIS: Metastatic non-small cell lung cancer, squamous cell carcinoma presenting with 2 nodules in the left lung as well as one nodule in the right lung diagnosed in March of 2013.   PRIOR THERAPY: None   CURRENT THERAPY: systemic chemotherapy with carboplatin for AUC of 5 and paclitaxel 175 mg/M2 every 3 weeks with Neulasta support. Status post 4 cycles  INTERVAL HISTORY: Austin [name] 78 y.o. male returns to the clinic today for routine followup visit. He is status post a recent hospitalization for generalized weakness and poor by mouth intake. He presents today to proceed with cycle #5 of his systemic chemotherapy with carboplatin and paclitaxel with Neulasta support. He states however that he just feels too weak to proceed with chemotherapy today. He is noticed that his memory seems to be decreased and from the examples that he is giving me, seems to be primarily short-term memory. He requests a refill for his Restoril 15 mg capsules. He continues to complain of fatigue and mild peripheral neuropathy in the lower extremities. He denied having any significant chest pain, shortness breath, cough or hemoptysis.   MEDICAL HISTORY: Past Medical History  Diagnosis Date  . Stroke     1980S  . Shortness of breath     WITH COUGH  . Blood transfusion   . Cancer 06/2011    lung  . Cancer of lung     ALLERGIES:  is allergic to penicillins.  MEDICATIONS:  Current Outpatient Prescriptions  Medication Sig Dispense Refill  . aspirin 81 MG tablet Take 81 mg by mouth daily.      . feeding supplement (ENSURE COMPLETE) LIQD Take 237 mLs by mouth 2 (two) times daily between meals.  30 Bottle  0  . gabapentin (NEURONTIN) 300 MG capsule Take 1 capsule (300 mg total) by mouth at bedtime.  30 capsule  0  . lidocaine-prilocaine  (EMLA) cream Apply topically as needed.  30 g  0  . prochlorperazine (COMPAZINE) 10 MG tablet Take 1 tablet (10 mg total) by mouth every 6 (six) hours as needed.  60 tablet  1  . temazepam (RESTORIL) 15 MG capsule Take 1 capsule (15 mg total) by mouth at bedtime as needed for sleep.  30 capsule  0  . DISCONTD: temazepam (RESTORIL) 15 MG capsule Take 1 capsule (15 mg total) by mouth at bedtime as needed for sleep.  30 capsule  0  . DISCONTD: temazepam (RESTORIL) 15 MG capsule Take 1 capsule (15 mg total) by mouth at bedtime as needed for sleep.  30 capsule  0    SURGICAL HISTORY:  Past Surgical History  Procedure Date  . Cholecystectomy   . Cardiovascular stress test     UNSURE OF DATE  ([facility])    REVIEW OF SYSTEMS:  Pertinent items are noted in HPI.   PHYSICAL EXAMINATION: General appearance: alert, cooperative and no distress Head: Normocephalic, without obvious abnormality, atraumatic Neck: no adenopathy Lymph nodes: Cervical, supraclavicular, and axillary nodes normal. Resp: clear to auscultation bilaterally Cardio: regular rate and rhythm, S1, S2 normal, no murmur, click, rub or gallop GI: soft, non-tender; bowel sounds normal; no masses,  no organomegaly Extremities: extremities normal, atraumatic, no cyanosis or edema Neurologic: Alert and oriented X 3, normal strength and tone. Normal symmetric reflexes. Normal coordination and gait  ECOG  PERFORMANCE STATUS: 1 - Symptomatic but completely ambulatory  Blood pressure 143/72, pulse 96, temperature 96.8 F (36 C), temperature source Oral, resp. rate 18, height 5\' 10"  (1.778 m), weight 167 lb 8 oz (75.978 kg).  LABORATORY DATA: Lab Results  Component Value Date   WBC 16.5* 02/03/2012   HGB 10.7* 02/03/2012   HCT 31.1* 02/03/2012   MCV 104.4* 02/03/2012   PLT 140 02/03/2012      Chemistry      Component Value Date/Time   NA 127* 02/03/2012 1404   NA 132* 01/29/2012 0510   K 4.2 02/03/2012 1404   K 3.6  01/29/2012 0510   CL 94* 02/03/2012 1404   CL 99 01/29/2012 0510   CO2 24 02/03/2012 1404   CO2 25 01/29/2012 0510   BUN 13.0 02/03/2012 1404   BUN 8 01/29/2012 0510   CREATININE 0.8 02/03/2012 1404   CREATININE 0.75 01/29/2012 0510      Component Value Date/Time   CALCIUM 8.9 02/03/2012 1404   CALCIUM 9.2 01/29/2012 0510   ALKPHOS 138 02/03/2012 1404   ALKPHOS 133* 01/27/2012 1337   AST 23 02/03/2012 1404   AST 21 01/27/2012 1337   ALT 24 02/03/2012 1404   ALT 29 01/27/2012 1337   BILITOT 0.80 02/03/2012 1404   BILITOT 0.8 01/27/2012 1337       RADIOGRAPHIC STUDIES: Ct Chest W Contrast  12/15/2011  *RADIOLOGY REPORT*  Clinical Data:  Lung carcinoma, post chemotherapy.  CT CHEST, ABDOMEN AND PELVIS WITH CONTRAST  Technique:  Multidetector CT imaging of the chest, abdomen and pelvis was performed following the standard protocol during bolus administration of intravenous contrast.  Contrast: OMNIPAQUE IOHEXOL 300 MG/ML  SOLN  Comparison:  PET CT 07/04/2011 and earlier studies   CT CHEST  Findings:  Right IJ port catheter extends to the cavoatrial junction.  Patchy coronary and aortic calcifications are evident. No pleural or pericardial effusion.  Sub centimeter precarinal, pretracheal, and AP window lymph nodes are stable in size.  No hilar adenopathy.  Metallic shot in the right anterior chest wall as before.  19 x 35 mm spiculated mass in the superior segment left lower lobe, previously 23 x 37, now with some central cavitation evident. There is a second spiculated 10 x 13 mm mass in the right suprahilar region adjacent to the major fissure in the left lower lobe (previously 18 x 19 mm). 7 x 11 mm spiculated   subpleural nodule in the superior segment right lower lobe, previously 8.8 mm. No new pulmonary nodule. Thoracic spine and sternum unremarkable.  IMPRESSION:  1.  Interval decrease in size of the 2   left lower lobe superior segment nodules. 2.  Slight increase in size of the  smaller nodule in the superior segment right lower lobe. 3. No new nodule, mass, or adenopathy.   CT ABDOMEN AND PELVIS  Findings:  Calcified granulomas in the liver and spleen.  Vascular clips in the gallbladder fossa.  Unremarkable adrenal glands. Small cyst in the upper pole left kidney.  Moderate aortoiliac arterial calcifications without aneurysm.  Portal vein patent. Atrophic pancreas.  Stomach and small bowel are nondistended. Appendix not discretely identified.  The colon is nondilated, unremarkable.  Urinary bladder is distended.  There is moderate prostatic enlargement with central coarse calcifications.  No ascites.  No free air.  No pelvic or retroperitoneal adenopathy. No hydronephrosis on delayed scans.  Degenerative disc disease L4- S1.  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  IMPRESSION:  1.  Negative for mass, nodule, or adenopathy. 2.  Additional ancillary findings as above.   Original Report Authenticated By: Osa Craver, M.D.    ASSESSMENT/PLAN: This is a very pleasant 78 years old white male with history of metastatic non-small cell lung cancer squamous cell carcinoma status post 4 cycles of systemic chemotherapy was carboplatin and paclitaxel with some improvement in his disease. The patient was discussed with Dr. Arbutus Ped. We will postpone cycle #5 of his systemic chemotherapy with carboplatin for AUC of 5 and paclitaxel 175 mg/M2 with Neulasta support to next week at the patient's request due to his overall performance status. He is given a refill for his Restoril 15 mg capsules, total 30 with no refill. He'll return in one week for cycle #5 of his systemic chemotherapy with carboplatin and paclitaxel with Neulasta support. He will continue with weekly labs and return in 4 weeks for another symptom management visit with a repeat CBC differential and C. met prior to cycle #6.Marland Kitchen   Tiana Loft E   All questions were answered. The patient knows to call the clinic with any  problems, questions or concerns. We can certainly see the patient much sooner if necessary.  I spent 20 minutes counseling the patient face to face. The total time spent in the appointment was 30 minutes.

## 2012-02-11 ENCOUNTER — Ambulatory Visit: Payer: Medicare Other

## 2012-02-12 ENCOUNTER — Other Ambulatory Visit: Payer: Self-pay | Admitting: Physician Assistant

## 2012-02-12 DIAGNOSIS — C3490 Malignant neoplasm of unspecified part of unspecified bronchus or lung: Secondary | ICD-10-CM

## 2012-02-12 DIAGNOSIS — Z5111 Encounter for antineoplastic chemotherapy: Secondary | ICD-10-CM

## 2012-02-17 ENCOUNTER — Other Ambulatory Visit: Payer: Self-pay | Admitting: Internal Medicine

## 2012-02-17 ENCOUNTER — Ambulatory Visit (HOSPITAL_BASED_OUTPATIENT_CLINIC_OR_DEPARTMENT_OTHER): Payer: Medicare HMO

## 2012-02-17 ENCOUNTER — Other Ambulatory Visit (HOSPITAL_BASED_OUTPATIENT_CLINIC_OR_DEPARTMENT_OTHER): Payer: Medicare HMO | Admitting: Lab

## 2012-02-17 VITALS — BP 117/67 | HR 111 | Temp 97.0°F | Resp 18

## 2012-02-17 DIAGNOSIS — C3430 Malignant neoplasm of lower lobe, unspecified bronchus or lung: Secondary | ICD-10-CM

## 2012-02-17 DIAGNOSIS — Z5111 Encounter for antineoplastic chemotherapy: Secondary | ICD-10-CM

## 2012-02-17 DIAGNOSIS — C3490 Malignant neoplasm of unspecified part of unspecified bronchus or lung: Secondary | ICD-10-CM

## 2012-02-17 LAB — COMPREHENSIVE METABOLIC PANEL (CC13)
AST: 19 U/L (ref 5–34)
Albumin: 3.4 g/dL — ABNORMAL LOW (ref 3.5–5.0)
Alkaline Phosphatase: 102 U/L (ref 40–150)
BUN: 17 mg/dL (ref 7.0–26.0)
Creatinine: 0.8 mg/dL (ref 0.7–1.3)
Potassium: 4.4 mEq/L (ref 3.5–5.1)
Total Bilirubin: 0.33 mg/dL (ref 0.20–1.20)

## 2012-02-17 LAB — CBC WITH DIFFERENTIAL/PLATELET
EOS%: 2 % (ref 0.0–7.0)
LYMPH%: 21.5 % (ref 14.0–49.0)
MCH: 34 pg — ABNORMAL HIGH (ref 27.2–33.4)
MCHC: 34.9 g/dL (ref 32.0–36.0)
MCV: 97.4 fL (ref 79.3–98.0)
MONO%: 15.1 % — ABNORMAL HIGH (ref 0.0–14.0)
Platelets: 328 10*3/uL (ref 140–400)
RBC: 3.5 10*6/uL — ABNORMAL LOW (ref 4.20–5.82)
RDW: 15.8 % — ABNORMAL HIGH (ref 11.0–14.6)
nRBC: 0 % (ref 0–0)

## 2012-02-17 MED ORDER — SODIUM CHLORIDE 0.9 % IV SOLN
448.8000 mg | Freq: Once | INTRAVENOUS | Status: AC
  Administered 2012-02-17: 450 mg via INTRAVENOUS
  Filled 2012-02-17: qty 45

## 2012-02-17 MED ORDER — HEPARIN SOD (PORK) LOCK FLUSH 100 UNIT/ML IV SOLN
500.0000 [IU] | Freq: Once | INTRAVENOUS | Status: AC | PRN
  Administered 2012-02-17: 500 [IU]
  Filled 2012-02-17: qty 5

## 2012-02-17 MED ORDER — FAMOTIDINE IN NACL 20-0.9 MG/50ML-% IV SOLN
20.0000 mg | Freq: Once | INTRAVENOUS | Status: AC
  Administered 2012-02-17: 20 mg via INTRAVENOUS

## 2012-02-17 MED ORDER — PACLITAXEL CHEMO INJECTION 300 MG/50ML
150.0000 mg/m2 | Freq: Once | INTRAVENOUS | Status: AC
  Administered 2012-02-17: 300 mg via INTRAVENOUS
  Filled 2012-02-17: qty 50

## 2012-02-17 MED ORDER — SODIUM CHLORIDE 0.9 % IJ SOLN
10.0000 mL | INTRAMUSCULAR | Status: DC | PRN
  Administered 2012-02-17: 10 mL
  Filled 2012-02-17: qty 10

## 2012-02-17 MED ORDER — DEXAMETHASONE SODIUM PHOSPHATE 4 MG/ML IJ SOLN
20.0000 mg | Freq: Once | INTRAMUSCULAR | Status: AC
  Administered 2012-02-17: 20 mg via INTRAVENOUS

## 2012-02-17 MED ORDER — SODIUM CHLORIDE 0.9 % IV SOLN
Freq: Once | INTRAVENOUS | Status: AC
  Administered 2012-02-17: 13:00:00 via INTRAVENOUS

## 2012-02-17 MED ORDER — ONDANSETRON 16 MG/50ML IVPB (CHCC)
16.0000 mg | Freq: Once | INTRAVENOUS | Status: AC
  Administered 2012-02-17: 16 mg via INTRAVENOUS

## 2012-02-17 MED ORDER — DIPHENHYDRAMINE HCL 50 MG/ML IJ SOLN
50.0000 mg | Freq: Once | INTRAMUSCULAR | Status: AC
  Administered 2012-02-17: 50 mg via INTRAVENOUS

## 2012-02-17 NOTE — Patient Instructions (Addendum)
[facility] Discharge Instructions for Patients Receiving Chemotherapy  Today you received the following chemotherapy agents Taxol/Carboplatin.  To help prevent nausea and vomiting after your treatment, we encourage you to take your nausea medication as directed.   If you develop nausea and vomiting that is not controlled by your nausea medication, call the clinic. If it is after clinic hours your family physician or the after hours number for the clinic or go to the Emergency Department.   BELOW ARE SYMPTOMS THAT SHOULD BE REPORTED IMMEDIATELY:  *FEVER GREATER THAN 100.5 F  *CHILLS WITH OR WITHOUT FEVER  NAUSEA AND VOMITING THAT IS NOT CONTROLLED WITH YOUR NAUSEA MEDICATION  *UNUSUAL SHORTNESS OF BREATH  *UNUSUAL BRUISING OR BLEEDING  TENDERNESS IN MOUTH AND THROAT WITH OR WITHOUT PRESENCE OF ULCERS  *URINARY PROBLEMS  *BOWEL PROBLEMS  UNUSUAL RASH Items with * indicate a potential emergency and should be followed up as soon as possible.  Feel free to call the clinic you have any questions or concerns. The clinic phone number is [phone].   

## 2012-02-18 ENCOUNTER — Ambulatory Visit (HOSPITAL_BASED_OUTPATIENT_CLINIC_OR_DEPARTMENT_OTHER): Payer: Medicare HMO

## 2012-02-18 ENCOUNTER — Encounter: Payer: Self-pay | Admitting: Nutrition

## 2012-02-18 VITALS — BP 121/69 | HR 118 | Temp 95.8°F

## 2012-02-18 DIAGNOSIS — C3430 Malignant neoplasm of lower lobe, unspecified bronchus or lung: Secondary | ICD-10-CM

## 2012-02-18 DIAGNOSIS — C3490 Malignant neoplasm of unspecified part of unspecified bronchus or lung: Secondary | ICD-10-CM

## 2012-02-18 MED ORDER — PEGFILGRASTIM INJECTION 6 MG/0.6ML
6.0000 mg | Freq: Once | SUBCUTANEOUS | Status: AC
  Administered 2012-02-18: 6 mg via SUBCUTANEOUS
  Filled 2012-02-18: qty 0.6

## 2012-02-18 NOTE — Progress Notes (Signed)
Patient has received his third and final case of Ensure Plus.

## 2012-02-24 ENCOUNTER — Other Ambulatory Visit (HOSPITAL_BASED_OUTPATIENT_CLINIC_OR_DEPARTMENT_OTHER): Payer: Medicare HMO | Admitting: Lab

## 2012-02-24 DIAGNOSIS — C3490 Malignant neoplasm of unspecified part of unspecified bronchus or lung: Secondary | ICD-10-CM

## 2012-02-24 LAB — COMPREHENSIVE METABOLIC PANEL (CC13)
Alkaline Phosphatase: 164 U/L — ABNORMAL HIGH (ref 40–150)
BUN: 15 mg/dL (ref 7.0–26.0)
Creatinine: 0.8 mg/dL (ref 0.7–1.3)
Glucose: 108 mg/dl — ABNORMAL HIGH (ref 70–99)
Sodium: 133 mEq/L — ABNORMAL LOW (ref 136–145)
Total Bilirubin: 0.47 mg/dL (ref 0.20–1.20)

## 2012-02-24 LAB — CBC WITH DIFFERENTIAL/PLATELET
Basophils Absolute: 0.1 10*3/uL (ref 0.0–0.1)
Eosinophils Absolute: 0.2 10*3/uL (ref 0.0–0.5)
HCT: 32 % — ABNORMAL LOW (ref 38.4–49.9)
LYMPH%: 14.3 % (ref 14.0–49.0)
MCV: 102.4 fL — ABNORMAL HIGH (ref 79.3–98.0)
MONO%: 19.5 % — ABNORMAL HIGH (ref 0.0–14.0)
NEUT#: 8 10*3/uL — ABNORMAL HIGH (ref 1.5–6.5)
NEUT%: 63.9 % (ref 39.0–75.0)
Platelets: 113 10*3/uL — ABNORMAL LOW (ref 140–400)
RBC: 3.12 10*6/uL — ABNORMAL LOW (ref 4.20–5.82)

## 2012-03-02 ENCOUNTER — Other Ambulatory Visit (HOSPITAL_BASED_OUTPATIENT_CLINIC_OR_DEPARTMENT_OTHER): Payer: Medicare HMO

## 2012-03-02 ENCOUNTER — Ambulatory Visit: Payer: Medicare Other

## 2012-03-02 DIAGNOSIS — C3490 Malignant neoplasm of unspecified part of unspecified bronchus or lung: Secondary | ICD-10-CM

## 2012-03-02 LAB — COMPREHENSIVE METABOLIC PANEL (CC13)
ALT: 28 U/L (ref 0–55)
AST: 20 U/L (ref 5–34)
Albumin: 3.5 g/dL (ref 3.5–5.0)
Alkaline Phosphatase: 180 U/L — ABNORMAL HIGH (ref 40–150)
BUN: 16 mg/dL (ref 7.0–26.0)
CO2: 26 mEq/L (ref 22–29)
Calcium: 9.6 mg/dL (ref 8.4–10.4)
Chloride: 102 mEq/L (ref 98–107)
Creatinine: 0.9 mg/dL (ref 0.7–1.3)
Glucose: 157 mg/dl — ABNORMAL HIGH (ref 70–99)
Potassium: 4.2 mEq/L (ref 3.5–5.1)
Sodium: 134 mEq/L — ABNORMAL LOW (ref 136–145)
Total Bilirubin: 0.46 mg/dL (ref 0.20–1.20)
Total Protein: 6.8 g/dL (ref 6.4–8.3)

## 2012-03-02 LAB — CBC WITH DIFFERENTIAL/PLATELET
Basophils Absolute: 0.2 10*3/uL — ABNORMAL HIGH (ref 0.0–0.1)
Eosinophils Absolute: 0.1 10*3/uL (ref 0.0–0.5)
HCT: 33.7 % — ABNORMAL LOW (ref 38.4–49.9)
HGB: 11.7 g/dL — ABNORMAL LOW (ref 13.0–17.1)
LYMPH%: 8.6 % — ABNORMAL LOW (ref 14.0–49.0)
MCV: 102.2 fL — ABNORMAL HIGH (ref 79.3–98.0)
MONO#: 1.8 10*3/uL — ABNORMAL HIGH (ref 0.1–0.9)
MONO%: 9.8 % (ref 0.0–14.0)
NEUT#: 14.8 10*3/uL — ABNORMAL HIGH (ref 1.5–6.5)
Platelets: 167 10*3/uL (ref 140–400)
WBC: 18.4 10*3/uL — ABNORMAL HIGH (ref 4.0–10.3)

## 2012-03-03 ENCOUNTER — Ambulatory Visit: Payer: Medicare Other

## 2012-03-09 ENCOUNTER — Ambulatory Visit (HOSPITAL_BASED_OUTPATIENT_CLINIC_OR_DEPARTMENT_OTHER): Payer: Medicare HMO | Admitting: Physician Assistant

## 2012-03-09 ENCOUNTER — Ambulatory Visit: Payer: Medicare Other

## 2012-03-09 ENCOUNTER — Encounter: Payer: Self-pay | Admitting: Physician Assistant

## 2012-03-09 ENCOUNTER — Encounter: Payer: Self-pay | Admitting: Nutrition

## 2012-03-09 ENCOUNTER — Telehealth: Payer: Self-pay | Admitting: Internal Medicine

## 2012-03-09 ENCOUNTER — Encounter: Payer: Medicare Other | Admitting: Nutrition

## 2012-03-09 ENCOUNTER — Other Ambulatory Visit (HOSPITAL_BASED_OUTPATIENT_CLINIC_OR_DEPARTMENT_OTHER): Payer: Medicare HMO | Admitting: Lab

## 2012-03-09 VITALS — BP 146/82 | HR 109 | Temp 97.2°F | Resp 20 | Ht 70.0 in | Wt 163.0 lb

## 2012-03-09 DIAGNOSIS — R5383 Other fatigue: Secondary | ICD-10-CM

## 2012-03-09 DIAGNOSIS — C3490 Malignant neoplasm of unspecified part of unspecified bronchus or lung: Secondary | ICD-10-CM

## 2012-03-09 DIAGNOSIS — C3430 Malignant neoplasm of lower lobe, unspecified bronchus or lung: Secondary | ICD-10-CM

## 2012-03-09 DIAGNOSIS — G609 Hereditary and idiopathic neuropathy, unspecified: Secondary | ICD-10-CM

## 2012-03-09 LAB — CBC WITH DIFFERENTIAL/PLATELET
Basophils Absolute: 0.1 10*3/uL (ref 0.0–0.1)
EOS%: 1 % (ref 0.0–7.0)
HCT: 33 % — ABNORMAL LOW (ref 38.4–49.9)
HGB: 11.3 g/dL — ABNORMAL LOW (ref 13.0–17.1)
LYMPH%: 19 % (ref 14.0–49.0)
MCH: 33.5 pg — ABNORMAL HIGH (ref 27.2–33.4)
MCV: 97.9 fL (ref 79.3–98.0)
MONO%: 19.5 % — ABNORMAL HIGH (ref 0.0–14.0)
NEUT%: 59.8 % (ref 39.0–75.0)
Platelets: 196 10*3/uL (ref 140–400)
RDW: 16.9 % — ABNORMAL HIGH (ref 11.0–14.6)

## 2012-03-09 NOTE — Telephone Encounter (Signed)
appts made and printed for pt pt aware that cen/sch will call with scan appt and contrast was given to pt

## 2012-03-09 NOTE — Patient Instructions (Addendum)
At your request we have cancelled your 6th cycle of chemotherapy Follow up with Dr. Arbutus Ped in 3 weeks with a restaging CT scan of your chest, abdomen and pelvis to re-evaluate your disease

## 2012-03-09 NOTE — Progress Notes (Signed)
Patient refused chemotherapy treatment today so I was unable to follow up with patient today.

## 2012-03-09 NOTE — Progress Notes (Signed)
Pt refused tx.  adrena notified.  Tiana Loft pa will see pt   dmr

## 2012-03-10 ENCOUNTER — Ambulatory Visit: Payer: Medicare Other
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
## 2012-03-16 ENCOUNTER — Other Ambulatory Visit (HOSPITAL_BASED_OUTPATIENT_CLINIC_OR_DEPARTMENT_OTHER): Payer: Medicare HMO

## 2012-03-16 DIAGNOSIS — C3490 Malignant neoplasm of unspecified part of unspecified bronchus or lung: Secondary | ICD-10-CM

## 2012-03-16 LAB — CBC WITH DIFFERENTIAL/PLATELET
BASO%: 0.9 % (ref 0.0–2.0)
HCT: 36.1 % — ABNORMAL LOW (ref 38.4–49.9)
LYMPH%: 27 % (ref 14.0–49.0)
MCHC: 32.8 g/dL (ref 32.0–36.0)
MCV: 101.8 fL — ABNORMAL HIGH (ref 79.3–98.0)
MONO%: 17 % — ABNORMAL HIGH (ref 0.0–14.0)
NEUT%: 52 % (ref 39.0–75.0)
Platelets: 210 10*3/uL (ref 140–400)
RBC: 3.55 10*6/uL — ABNORMAL LOW (ref 4.20–5.82)

## 2012-03-16 LAB — COMPREHENSIVE METABOLIC PANEL (CC13)
ALT: 23 U/L (ref 0–55)
Alkaline Phosphatase: 135 U/L (ref 40–150)
Creatinine: 0.9 mg/dL (ref 0.7–1.3)
Glucose: 100 mg/dl — ABNORMAL HIGH (ref 70–99)
Sodium: 141 mEq/L (ref 136–145)
Total Bilirubin: 0.32 mg/dL (ref 0.20–1.20)
Total Protein: 6.9 g/dL (ref 6.4–8.3)

## 2012-03-26 ENCOUNTER — Ambulatory Visit (HOSPITAL_COMMUNITY)
Admission: RE | Admit: 2012-03-26 | Discharge: 2012-03-26 | Disposition: A | Discharge status: Home / Self Care | Payer: Medicare HMO | Source: Ambulatory Visit | Attending: Physician Assistant | Admitting: Physician Assistant

## 2012-03-26 ENCOUNTER — Encounter (HOSPITAL_COMMUNITY): Payer: Self-pay

## 2012-03-26 DIAGNOSIS — C3490 Malignant neoplasm of unspecified part of unspecified bronchus or lung: Secondary | ICD-10-CM | POA: Insufficient documentation

## 2012-03-26 DIAGNOSIS — K7689 Other specified diseases of liver: Secondary | ICD-10-CM | POA: Insufficient documentation

## 2012-03-26 MED ORDER — IOHEXOL 300 MG/ML  SOLN
100.0000 mL | Freq: Once | INTRAMUSCULAR | Status: AC | PRN
  Administered 2012-03-26: 100 mL via INTRAVENOUS

## 2012-03-29 ENCOUNTER — Other Ambulatory Visit: Payer: Self-pay | Admitting: Medical Oncology

## 2012-03-29 ENCOUNTER — Telehealth: Payer: Self-pay | Admitting: Internal Medicine

## 2012-03-29 ENCOUNTER — Other Ambulatory Visit (HOSPITAL_BASED_OUTPATIENT_CLINIC_OR_DEPARTMENT_OTHER): Payer: Medicare HMO | Admitting: Lab

## 2012-03-29 ENCOUNTER — Ambulatory Visit (HOSPITAL_BASED_OUTPATIENT_CLINIC_OR_DEPARTMENT_OTHER): Payer: Medicare HMO | Admitting: Internal Medicine

## 2012-03-29 VITALS — BP 132/72 | HR 101 | Temp 97.1°F | Resp 20 | Ht 70.0 in | Wt 167.1 lb

## 2012-03-29 DIAGNOSIS — C3490 Malignant neoplasm of unspecified part of unspecified bronchus or lung: Secondary | ICD-10-CM

## 2012-03-29 DIAGNOSIS — C3430 Malignant neoplasm of lower lobe, unspecified bronchus or lung: Secondary | ICD-10-CM

## 2012-03-29 DIAGNOSIS — Z5111 Encounter for antineoplastic chemotherapy: Secondary | ICD-10-CM

## 2012-03-29 LAB — CBC WITH DIFFERENTIAL/PLATELET
Eosinophils Absolute: 0.2 10*3/uL (ref 0.0–0.5)
HCT: 36.7 % — ABNORMAL LOW (ref 38.4–49.9)
LYMPH%: 31.5 % (ref 14.0–49.0)
MCHC: 34.9 g/dL (ref 32.0–36.0)
MCV: 102.9 fL — ABNORMAL HIGH (ref 79.3–98.0)
MONO%: 14.6 % — ABNORMAL HIGH (ref 0.0–14.0)
NEUT#: 4.4 10*3/uL (ref 1.5–6.5)
NEUT%: 50.2 % (ref 39.0–75.0)
Platelets: 201 10*3/uL (ref 140–400)
RBC: 3.57 10*6/uL — ABNORMAL LOW (ref 4.20–5.82)

## 2012-03-29 LAB — COMPREHENSIVE METABOLIC PANEL (CC13)
Alkaline Phosphatase: 109 U/L (ref 40–150)
BUN: 14 mg/dL (ref 7.0–26.0)
Creatinine: 0.9 mg/dL (ref 0.7–1.3)
Glucose: 70 mg/dl (ref 70–99)
Sodium: 138 mEq/L (ref 136–145)
Total Bilirubin: 0.46 mg/dL (ref 0.20–1.20)
Total Protein: 6.6 g/dL (ref 6.4–8.3)

## 2012-03-29 MED ORDER — LORAZEPAM 0.5 MG PO TABS: 0.5000 mg | ORAL_TABLET | Freq: Three times a day (TID) | ORAL | Status: DC | PRN

## 2012-03-29 NOTE — Patient Instructions (Addendum)
Your scan showed stable disease. Continue on observation with repeat CT scan of the chest, abdomen and pelvis in 3 months.

## 2012-03-29 NOTE — Telephone Encounter (Signed)
appts made and printed for pt Pt aware that he will get a letter with the scan appt and contrast was given      anne

## 2012-03-29 NOTE — Progress Notes (Signed)
[facility] [phone]   [phone]  OFFICE PROGRESS NOTE  [name], MD [address] [place] [address]  DIAGNOSIS: Metastatic non-small cell lung cancer, squamous cell carcinoma presenting with 2 nodules in the left lung as well as one nodule in the right lung diagnosed in March of 2013.   PRIOR THERAPY: None   CURRENT THERAPY: systemic chemotherapy with carboplatin for AUC of 5 and paclitaxel 175 mg/M2 every 3 weeks with Neulasta support. Status post 5 cycles, last dose was given on 02/17/2012 with stable disease  INTERVAL HISTORY: Austin [name] 78 y.o. male returns to the clinic today for followup visit. The patient is feeling much better today after he has been on chemotherapy break since 02/17/2012. He denied having any significant fatigue or weakness. He denied having any chest pain, shortness breath except with exertion, no cough or hemoptysis. The patient had repeat CT scan of the chest, abdomen and pelvis performed recently and he is here for evaluation and discussion of his scan results.  MEDICAL HISTORY: Past Medical History  Diagnosis Date  . Stroke     1980S  . Shortness of breath     WITH COUGH  . Blood transfusion   . Cancer 06/2011    lung  . Cancer of lung     ALLERGIES:  is allergic to penicillins.  MEDICATIONS:  Current Outpatient Prescriptions  Medication Sig Dispense Refill  . aspirin 81 MG tablet Take 81 mg by mouth daily.      . feeding supplement (ENSURE COMPLETE) LIQD Take 237 mLs by mouth 2 (two) times daily between meals.  30 Bottle  0  . gabapentin (NEURONTIN) 300 MG capsule Take 1 capsule (300 mg total) by mouth at bedtime.  30 capsule  0  . lidocaine-prilocaine (EMLA) cream Apply topically as needed.  30 g  0  . LORazepam (ATIVAN) 0.5 MG tablet TAKE 1 TABLET BY MOUTH EVERY 8 HOURS AS NEEDED FOR ANXIET OR AGITATION  40 tablet  0  . prochlorperazine (COMPAZINE) 10 MG tablet Take 1 tablet  (10 mg total) by mouth every 6 (six) hours as needed.  60 tablet  1  . temazepam (RESTORIL) 15 MG capsule Take 1 capsule (15 mg total) by mouth at bedtime as needed for sleep.  30 capsule  0    SURGICAL HISTORY:  Past Surgical History  Procedure Date  . Cholecystectomy   . Cardiovascular stress test     UNSURE OF DATE  ([facility])    REVIEW OF SYSTEMS:  A comprehensive review of systems was negative except for: Respiratory: positive for dyspnea on exertion   PHYSICAL EXAMINATION: General appearance: alert, cooperative and no distress Head: Normocephalic, without obvious abnormality, atraumatic Neck: no adenopathy Resp: clear to auscultation bilaterally Cardio: regular rate and rhythm, S1, S2 normal, no murmur, click, rub or gallop GI: soft, non-tender; bowel sounds normal; no masses,  no organomegaly Extremities: extremities normal, atraumatic, no cyanosis or edema Neurologic: Alert and oriented X 3, normal strength and tone. Normal symmetric reflexes. Normal coordination and gait  ECOG PERFORMANCE STATUS: 1 - Symptomatic but completely ambulatory  Blood pressure 132/72, pulse 101, temperature 97.1 F (36.2 C), temperature source Oral, resp. rate 20, height 5\' 10"  (1.778 m), weight 167 lb 1.6 oz (75.796 kg).  LABORATORY DATA: Lab Results  Component Value Date   WBC 8.8 03/29/2012   HGB 12.8* 03/29/2012   HCT 36.7* 03/29/2012   MCV  102.9* 03/29/2012   PLT 201 03/29/2012      Chemistry      Component Value Date/Time   NA 141 03/16/2012 1321   NA 132* 01/29/2012 0510   K 4.2 03/16/2012 1321   K 3.6 01/29/2012 0510   CL 100 03/16/2012 1321   CL 99 01/29/2012 0510   CO2 30* 03/16/2012 1321   CO2 25 01/29/2012 0510   BUN 17.0 03/16/2012 1321   BUN 8 01/29/2012 0510   CREATININE 0.9 03/16/2012 1321   CREATININE 0.75 01/29/2012 0510      Component Value Date/Time   CALCIUM 9.4 03/16/2012 1321   CALCIUM 9.2 01/29/2012 0510   ALKPHOS 135 03/16/2012 1321   ALKPHOS 133*  01/27/2012 1337   AST 19 03/16/2012 1321   AST 21 01/27/2012 1337   ALT 23 03/16/2012 1321   ALT 29 01/27/2012 1337   BILITOT 0.32 03/16/2012 1321   BILITOT 0.8 01/27/2012 1337       RADIOGRAPHIC STUDIES: Ct Chest W Contrast  03/26/2012  *RADIOLOGY REPORT*  Clinical Data:  Restaging lung cancer.  CT CHEST, ABDOMEN AND PELVIS WITH CONTRAST  Technique:  Multidetector CT imaging of the chest, abdomen and pelvis was performed following the standard protocol during bolus administration of intravenous contrast.  Contrast: OMNIPAQUE IOHEXOL 300 MG/ML  SOLN  Comparison:  12/15/2011   CT CHEST  Findings:  Lungs/pleura: There are no pleural effusion identified. Index cavitary lesion in the left lower lobe measures 1.3 x 2.3 cm, image 33.  This is compared with 1.6 x 2.9 cm previously. Subpleural spiculated lesion in the left lower lobe measures 1.5 cm, image 39.  Previously 1.6 cm.  Lesion within the superior segment of the right lower lobe measures 1.1 cm, image 31. Previously 1.2 cm.  Bilateral, lower lobe predominant subpleural reticulation is identified.  Similar to previous exam.  Heart/Mediastinum: The heart size is normal.  No pericardial effusion.  Prominent calcifications involving the LAD, left circumflex and RCA coronary arteries noted. Stable precarinal lymph node which measures 1.1 cm, image number 27.  Previously this measured 1.2 cm.  Left hilar lymph node measures 1 cm, image 33.  Previously this measured the same.  No right hilar adenopathy.  Bones/Musculoskeletal:  Review of the visualized osseous structures demonstrates mild thoracic spondylosis.  No worrisome lytic or sclerotic bone lesions identified.  IMPRESSION:  1.  Stable to improved appearance of pulmonary nodules including the cavitary lesion in the left lower lobe. 2.  Stable borderline precarinal and left hilar adenopathy.   CT ABDOMEN AND PELVIS  Findings:  There are no focal liver abnormalities.  Mild low attenuation throughout  the liver appears similar to previous exam. Calcified granuloma identified within the left hepatic lobe. Previous cholecystectomy.  No significant biliary dilatation.  The pancreas is normal.  Multiple calcified splenic granulomas identified.  The adrenal glands are within normal limits.  Similar appearance of bilateral pelvocaliectasis.  There is a cyst within the upper pole the left kidney which is unchanged from previous exam.  The urinary bladder appears within normal limits.  Prostate gland calcifications are noted.  No enlarged upper abdominal lymph nodes.  There is no pelvic or inguinal adenopathy identified.  Aortic calcifications identified. No aneurysm present.  No free fluid or fluid collections identified.  The stomach appears normal.  The small bowel loops have a normal course and caliber. Normal appearance of the colon  Bones/Musculoskeletal:  Review of the visualized osseous structures shows changes of lumbar degenerative  disc disease.  There is no aggressive lytic or sclerotic bone lesion identified.  IMPRESSION:  1.  No evidence for metastatic disease to the abdomen or pelvis. 2.  Mild fatty infiltration of the liver. 3.  Prior granulomatous disease.   Original Report Authenticated By: Signa Kell, M.D.     ASSESSMENT: This is a very pleasant 78 years old white male with metastatic non-small cell lung cancer, squamous cell carcinoma status post 5 cycles of systemic chemotherapy with carboplatin and paclitaxel with stable disease after cycle #5.  PLAN: I discussed the scan results with the patient. I would recommend for him to continue on observation for now since he is not interested in taking any further chemotherapy at this point and he has stable disease. I would see the patient back for followup visit in 3 months with repeat CT scan of the chest, abdomen and pelvis. He was advised to call me immediately if he has any concerning symptoms in the interval.  All questions were answered.  The patient knows to call the clinic with any problems, questions or concerns. We can certainly see the patient much sooner if necessary.  I spent 15 minutes counseling the patient face to face. The total time spent in the appointment was 25 minutes.

## 2012-05-10 ENCOUNTER — Other Ambulatory Visit: Payer: Self-pay | Admitting: Internal Medicine

## 2012-06-17 ENCOUNTER — Telehealth: Payer: Self-pay | Admitting: Internal Medicine

## 2012-06-22 ENCOUNTER — Other Ambulatory Visit (HOSPITAL_BASED_OUTPATIENT_CLINIC_OR_DEPARTMENT_OTHER): Payer: Medicare HMO

## 2012-06-22 ENCOUNTER — Ambulatory Visit (HOSPITAL_COMMUNITY)
Admission: RE | Admit: 2012-06-22 | Discharge: 2012-06-22 | Disposition: A | Discharge status: Home / Self Care | Payer: Medicare HMO | Source: Ambulatory Visit | Attending: Internal Medicine | Admitting: Internal Medicine

## 2012-06-22 DIAGNOSIS — R918 Other nonspecific abnormal finding of lung field: Secondary | ICD-10-CM | POA: Insufficient documentation

## 2012-06-22 DIAGNOSIS — R0602 Shortness of breath: Secondary | ICD-10-CM | POA: Insufficient documentation

## 2012-06-22 DIAGNOSIS — M795 Residual foreign body in soft tissue: Secondary | ICD-10-CM | POA: Insufficient documentation

## 2012-06-22 DIAGNOSIS — I2510 Atherosclerotic heart disease of native coronary artery without angina pectoris: Secondary | ICD-10-CM | POA: Insufficient documentation

## 2012-06-22 DIAGNOSIS — I700 Atherosclerosis of aorta: Secondary | ICD-10-CM | POA: Insufficient documentation

## 2012-06-22 DIAGNOSIS — Z9089 Acquired absence of other organs: Secondary | ICD-10-CM | POA: Insufficient documentation

## 2012-06-22 DIAGNOSIS — C3430 Malignant neoplasm of lower lobe, unspecified bronchus or lung: Secondary | ICD-10-CM

## 2012-06-22 DIAGNOSIS — C3490 Malignant neoplasm of unspecified part of unspecified bronchus or lung: Secondary | ICD-10-CM | POA: Insufficient documentation

## 2012-06-22 DIAGNOSIS — Z1889 Other specified retained foreign body fragments: Secondary | ICD-10-CM | POA: Insufficient documentation

## 2012-06-22 DIAGNOSIS — Z9221 Personal history of antineoplastic chemotherapy: Secondary | ICD-10-CM | POA: Insufficient documentation

## 2012-06-22 LAB — COMPREHENSIVE METABOLIC PANEL (CC13)
Alkaline Phosphatase: 101 U/L (ref 40–150)
BUN: 19.6 mg/dL (ref 7.0–26.0)
CO2: 27 mEq/L (ref 22–29)
Glucose: 86 mg/dl (ref 70–99)
Total Bilirubin: 0.51 mg/dL (ref 0.20–1.20)
Total Protein: 7.4 g/dL (ref 6.4–8.3)

## 2012-06-22 LAB — CBC WITH DIFFERENTIAL/PLATELET
Basophils Absolute: 0.1 10*3/uL (ref 0.0–0.1)
Eosinophils Absolute: 0.2 10*3/uL (ref 0.0–0.5)
HCT: 40.7 % (ref 38.4–49.9)
HGB: 14.6 g/dL (ref 13.0–17.1)
LYMPH%: 31.8 % (ref 14.0–49.0)
MCV: 95.5 fL (ref 79.3–98.0)
MONO#: 1.2 10*3/uL — ABNORMAL HIGH (ref 0.1–0.9)
MONO%: 14.8 % — ABNORMAL HIGH (ref 0.0–14.0)
NEUT#: 4.2 10*3/uL (ref 1.5–6.5)
NEUT%: 50.4 % (ref 39.0–75.0)
Platelets: 182 10*3/uL (ref 140–400)
WBC: 8.4 10*3/uL (ref 4.0–10.3)

## 2012-06-22 MED ORDER — IOHEXOL 300 MG/ML  SOLN
100.0000 mL | Freq: Once | INTRAMUSCULAR | Status: AC | PRN
  Administered 2012-06-22: 100 mL via INTRAVENOUS

## 2012-06-24 ENCOUNTER — Telehealth: Payer: Self-pay | Admitting: Internal Medicine

## 2012-06-24 ENCOUNTER — Encounter: Payer: Self-pay | Admitting: *Deleted

## 2012-06-24 ENCOUNTER — Ambulatory Visit (HOSPITAL_BASED_OUTPATIENT_CLINIC_OR_DEPARTMENT_OTHER): Payer: Medicare HMO | Admitting: Internal Medicine

## 2012-06-24 ENCOUNTER — Encounter: Payer: Self-pay | Admitting: Internal Medicine

## 2012-06-24 VITALS — BP 140/79 | HR 94 | Temp 97.4°F | Resp 18 | Ht 70.0 in | Wt 174.6 lb

## 2012-06-24 DIAGNOSIS — Z5111 Encounter for antineoplastic chemotherapy: Secondary | ICD-10-CM

## 2012-06-24 MED ORDER — LORAZEPAM 0.5 MG PO TABS: 0.5000 mg | ORAL_TABLET | Freq: Three times a day (TID) | ORAL | Status: DC | PRN

## 2012-06-24 NOTE — Progress Notes (Signed)
Spoke with pt at [facility] today.  Education given on smoking cessation
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
## 2012-06-24 NOTE — Telephone Encounter (Signed)
gv and printed appt schedule for pt for June...gv pt Barium °

## 2012-06-26 NOTE — Patient Instructions (Signed)
Your scan showed stable disease except for a slightly enlarging right lower lobe nodule. Followup visit in 3 months with repeat CT scan of the chest, abdomen and pelvis.

## 2012-08-03 ENCOUNTER — Other Ambulatory Visit: Payer: Self-pay | Admitting: Internal Medicine

## 2012-08-03 DIAGNOSIS — C3490 Malignant neoplasm of unspecified part of unspecified bronchus or lung: Secondary | ICD-10-CM

## 2012-08-03 NOTE — Telephone Encounter (Signed)
LEFT MESSAGE ON PT.'S VOICE MAIL THAT FUTURE REFILLS FOR THIS MEDICATION WOULD NEED TO COME FROM PT.'S PRIMARY CARE PHYSICIAN.

## 2012-08-06 ENCOUNTER — Telehealth: Payer: Self-pay | Admitting: Internal Medicine

## 2012-09-22 ENCOUNTER — Ambulatory Visit (HOSPITAL_COMMUNITY)
Admission: RE | Admit: 2012-09-22 | Discharge: 2012-09-22 | Disposition: A | Discharge status: Home / Self Care | Payer: Medicare HMO | Source: Ambulatory Visit | Attending: Internal Medicine | Admitting: Internal Medicine

## 2012-09-22 ENCOUNTER — Other Ambulatory Visit (HOSPITAL_BASED_OUTPATIENT_CLINIC_OR_DEPARTMENT_OTHER): Payer: Medicare HMO | Admitting: Lab

## 2012-09-22 ENCOUNTER — Encounter (HOSPITAL_COMMUNITY): Payer: Self-pay

## 2012-09-22 DIAGNOSIS — R911 Solitary pulmonary nodule: Secondary | ICD-10-CM | POA: Insufficient documentation

## 2012-09-22 DIAGNOSIS — C3490 Malignant neoplasm of unspecified part of unspecified bronchus or lung: Secondary | ICD-10-CM

## 2012-09-22 DIAGNOSIS — I2510 Atherosclerotic heart disease of native coronary artery without angina pectoris: Secondary | ICD-10-CM | POA: Insufficient documentation

## 2012-09-22 DIAGNOSIS — C3430 Malignant neoplasm of lower lobe, unspecified bronchus or lung: Secondary | ICD-10-CM

## 2012-09-22 LAB — CBC WITH DIFFERENTIAL/PLATELET
Basophils Absolute: 0.1 10*3/uL (ref 0.0–0.1)
Eosinophils Absolute: 0.2 10*3/uL (ref 0.0–0.5)
HGB: 14.7 g/dL (ref 13.0–17.1)
LYMPH%: 28.3 % (ref 14.0–49.0)
MCV: 98.2 fL — ABNORMAL HIGH (ref 79.3–98.0)
MONO#: 1.8 10*3/uL — ABNORMAL HIGH (ref 0.1–0.9)
MONO%: 18.2 % — ABNORMAL HIGH (ref 0.0–14.0)
NEUT#: 5 10*3/uL (ref 1.5–6.5)
Platelets: 185 10*3/uL (ref 140–400)
RBC: 4.34 10*6/uL (ref 4.20–5.82)
RDW: 15.3 % — ABNORMAL HIGH (ref 11.0–14.6)
WBC: 10 10*3/uL (ref 4.0–10.3)

## 2012-09-22 LAB — COMPREHENSIVE METABOLIC PANEL (CC13)
Albumin: 3.5 g/dL (ref 3.5–5.0)
BUN: 15 mg/dL (ref 7.0–26.0)
CO2: 27 mEq/L (ref 22–29)
Glucose: 90 mg/dl (ref 70–99)
Potassium: 4.5 mEq/L (ref 3.5–5.1)
Sodium: 138 mEq/L (ref 136–145)
Total Protein: 7 g/dL (ref 6.4–8.3)

## 2012-09-22 MED ORDER — IOHEXOL 300 MG/ML  SOLN
100.0000 mL | Freq: Once | INTRAMUSCULAR | Status: AC | PRN
  Administered 2012-09-22: 100 mL via INTRAVENOUS

## 2012-09-23 ENCOUNTER — Ambulatory Visit: Payer: Medicare HMO | Admitting: Internal Medicine

## 2012-09-30 ENCOUNTER — Other Ambulatory Visit: Payer: Self-pay | Admitting: Medical Oncology

## 2012-09-30 ENCOUNTER — Ambulatory Visit (HOSPITAL_BASED_OUTPATIENT_CLINIC_OR_DEPARTMENT_OTHER): Payer: Medicare HMO | Admitting: Internal Medicine

## 2012-09-30 ENCOUNTER — Encounter: Payer: Self-pay | Admitting: Internal Medicine

## 2012-09-30 ENCOUNTER — Other Ambulatory Visit: Payer: Self-pay | Admitting: Internal Medicine

## 2012-09-30 VITALS — BP 132/69 | HR 90 | Temp 97.8°F | Resp 18 | Ht 70.0 in | Wt 182.5 lb

## 2012-09-30 DIAGNOSIS — C3430 Malignant neoplasm of lower lobe, unspecified bronchus or lung: Secondary | ICD-10-CM

## 2012-09-30 DIAGNOSIS — C3492 Malignant neoplasm of unspecified part of left bronchus or lung: Secondary | ICD-10-CM

## 2012-09-30 MED ORDER — LORAZEPAM 0.5 MG PO TABS: 0.5000 mg | ORAL_TABLET | Freq: Three times a day (TID) | ORAL | Status: DC | PRN

## 2012-09-30 MED ORDER — GABAPENTIN 300 MG PO CAPS: 300.0000 mg | ORAL_CAPSULE | Freq: Every day | ORAL | Status: DC
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
## 2012-09-30 NOTE — Telephone Encounter (Signed)
I instructed pt to call PCP for ativan and neurontin refills

## 2012-09-30 NOTE — Patient Instructions (Addendum)
No significant evidence for disease progression on his recent scan.  Followup visit in 3 months with repeat CT scan of the chest, abdomen and pelvis.

## 2012-10-13 ENCOUNTER — Encounter: Payer: Self-pay | Admitting: Family Medicine

## 2012-10-13 ENCOUNTER — Ambulatory Visit (INDEPENDENT_AMBULATORY_CARE_PROVIDER_SITE_OTHER): Payer: Medicare HMO | Admitting: Family Medicine

## 2012-10-13 VITALS — BP 134/85 | HR 101 | Temp 98.0°F | Ht 70.0 in | Wt 181.0 lb

## 2012-10-13 DIAGNOSIS — F341 Dysthymic disorder: Secondary | ICD-10-CM

## 2012-10-13 DIAGNOSIS — F17200 Nicotine dependence, unspecified, uncomplicated: Secondary | ICD-10-CM

## 2012-10-13 DIAGNOSIS — F419 Anxiety disorder, unspecified: Secondary | ICD-10-CM | POA: Insufficient documentation

## 2012-10-13 DIAGNOSIS — C3490 Malignant neoplasm of unspecified part of unspecified bronchus or lung: Secondary | ICD-10-CM

## 2012-10-13 DIAGNOSIS — Z0000 Encounter for general adult medical examination without abnormal findings: Secondary | ICD-10-CM | POA: Insufficient documentation

## 2012-10-13 DIAGNOSIS — R071 Chest pain on breathing: Secondary | ICD-10-CM

## 2012-10-13 DIAGNOSIS — F329 Major depressive disorder, single episode, unspecified: Secondary | ICD-10-CM | POA: Insufficient documentation

## 2012-10-13 DIAGNOSIS — C3492 Malignant neoplasm of unspecified part of left bronchus or lung: Secondary | ICD-10-CM

## 2012-10-13 MED ORDER — GABAPENTIN 300 MG PO CAPS: 300.0000 mg | ORAL_CAPSULE | Freq: Every evening | ORAL | Status: DC | PRN

## 2012-10-13 MED ORDER — ALBUTEROL SULFATE HFA 108 (90 BASE) MCG/ACT IN AERS: 2.0000 | INHALATION_SPRAY | Freq: Four times a day (QID) | RESPIRATORY_TRACT | Status: DC | PRN

## 2012-10-13 MED ORDER — LORAZEPAM 0.5 MG PO TABS: 0.5000 mg | ORAL_TABLET | Freq: Three times a day (TID) | ORAL | Status: DC | PRN

## 2012-10-13 NOTE — Patient Instructions (Addendum)
Make an appointment to see Dr. Raymondo Band (pharmacist) within the next month to talk about smoking cessation  Please schedule colonoscopy. Call [facility].  Tell them you want to schedule a "routine colonoscopy" and that your last one was more than 10 years ago.   Call in 2 months to schedule an appointment for medication refills

## 2012-10-13 NOTE — Assessment & Plan Note (Signed)
He is on Ativan qd prn started by oncologist.  Continue for now. Given for 3 months.  He warrants further evaluation of anxiety and depression if he needs this longer than 3 months if his lung cancer is stable. He was advised to follow-up prior to running out of medications.

## 2012-10-13 NOTE — Progress Notes (Signed)
  Subjective:    Patient ID: Austin [name], male    DOB: January 27, 1934, 79 y.o.   MRN: [id number]  HPI  # He is here for physical.  He declines shingles vaccination He feels he got tetanus shot within past 10 years   # Anxiety He takes Ativan once a day as needed.   # Neuropathy in abdomen following gallbladder surgery  Review of Systems Denies blood in stool, constipation, chest pain, difficulty breathing at this time (he takes nebs prn for wheezing)  Allergies, medication, past medical history reviewed.  Smoking status noted. # Smokes 1/2 ppd Reports anxiety/depression makes him smoke He tried Chantix but he became suicidal. Also tried electric cigarette.   Metastatic non-small cell lung cancer: receiving chemotherapy and being followed closely by oncology, last visit 06/12     Objective:   Physical Exam GEN: NAD  PSYCH: alert and oriented CV: RRR PULM: NI WOB; occasional wheeze right base; no crackles or ronchi ABD: soft, NT, ND     Assessment & Plan:

## 2012-10-13 NOTE — Assessment & Plan Note (Signed)
He reports chronic neuropathy LUQ following gallbladder surgery controlled on qhs prn gabapentin. Rx given for 3 months.

## 2012-10-13 NOTE — Assessment & Plan Note (Signed)
Advised schedule colonoscopy Declines shingles

## 2012-10-13 NOTE — Assessment & Plan Note (Signed)
He continues to smoke 1/2 ppd despite being treated for lung cancer recently.  He says depression/anxiety may make it difficult for him to quit since smoking helps with these symptoms.  He tried Chantix and electric cigarette.  -Make an appointment to see Dr. Raymondo Band for smoking cessation counseling.  -He declines anti-depressant at this time. May contineu Ativan prn anxiety.

## 2012-10-26 ENCOUNTER — Ambulatory Visit: Payer: Medicare HMO | Admitting: Pharmacist

## 2012-12-30 ENCOUNTER — Ambulatory Visit (HOSPITAL_COMMUNITY)
Admission: RE | Admit: 2012-12-30 | Discharge: 2012-12-30 | Disposition: A | Discharge status: Home / Self Care | Payer: Medicare HMO | Source: Ambulatory Visit | Attending: Internal Medicine | Admitting: Internal Medicine

## 2012-12-30 ENCOUNTER — Other Ambulatory Visit (HOSPITAL_BASED_OUTPATIENT_CLINIC_OR_DEPARTMENT_OTHER): Payer: Commercial Managed Care - HMO | Admitting: Lab

## 2012-12-30 DIAGNOSIS — C3492 Malignant neoplasm of unspecified part of left bronchus or lung: Secondary | ICD-10-CM

## 2012-12-30 DIAGNOSIS — C3490 Malignant neoplasm of unspecified part of unspecified bronchus or lung: Secondary | ICD-10-CM

## 2012-12-30 DIAGNOSIS — I2510 Atherosclerotic heart disease of native coronary artery without angina pectoris: Secondary | ICD-10-CM | POA: Insufficient documentation

## 2012-12-30 DIAGNOSIS — I7090 Unspecified atherosclerosis: Secondary | ICD-10-CM | POA: Insufficient documentation

## 2012-12-30 DIAGNOSIS — J438 Other emphysema: Secondary | ICD-10-CM | POA: Insufficient documentation

## 2012-12-30 DIAGNOSIS — K7460 Unspecified cirrhosis of liver: Secondary | ICD-10-CM | POA: Insufficient documentation

## 2012-12-30 DIAGNOSIS — I700 Atherosclerosis of aorta: Secondary | ICD-10-CM | POA: Insufficient documentation

## 2012-12-30 DIAGNOSIS — Z9089 Acquired absence of other organs: Secondary | ICD-10-CM | POA: Insufficient documentation

## 2012-12-30 LAB — CBC WITH DIFFERENTIAL/PLATELET
Basophils Absolute: 0.1 10*3/uL (ref 0.0–0.1)
Eosinophils Absolute: 0.3 10*3/uL (ref 0.0–0.5)
HGB: 15.2 g/dL (ref 13.0–17.1)
MONO#: 1.8 10*3/uL — ABNORMAL HIGH (ref 0.1–0.9)
MONO%: 17.7 % — ABNORMAL HIGH (ref 0.0–14.0)
NEUT#: 4.8 10*3/uL (ref 1.5–6.5)
RBC: 4.43 10*6/uL (ref 4.20–5.82)
RDW: 15.2 % — ABNORMAL HIGH (ref 11.0–14.6)
WBC: 10 10*3/uL (ref 4.0–10.3)
lymph#: 3.1 10*3/uL (ref 0.9–3.3)

## 2012-12-30 LAB — COMPREHENSIVE METABOLIC PANEL (CC13)
Albumin: 3.5 g/dL (ref 3.5–5.0)
Alkaline Phosphatase: 86 U/L (ref 40–150)
CO2: 28 mEq/L (ref 22–29)
Calcium: 9.3 mg/dL (ref 8.4–10.4)
Chloride: 103 mEq/L (ref 98–109)
Glucose: 81 mg/dl (ref 70–140)
Potassium: 4.4 mEq/L (ref 3.5–5.1)
Sodium: 137 mEq/L (ref 136–145)
Total Protein: 7.2 g/dL (ref 6.4–8.3)

## 2012-12-30 MED ORDER — IOHEXOL 300 MG/ML  SOLN
100.0000 mL | Freq: Once | INTRAMUSCULAR | Status: AC | PRN
  Administered 2012-12-30: 100 mL via INTRAVENOUS

## 2012-12-30 MED ORDER — IOHEXOL 300 MG/ML  SOLN
30.0000 mL | Freq: Once | INTRAMUSCULAR | Status: AC | PRN
  Administered 2012-12-30: 30 mL via INTRAVENOUS

## 2013-01-03 ENCOUNTER — Encounter: Payer: Self-pay | Admitting: Internal Medicine

## 2013-01-03 ENCOUNTER — Other Ambulatory Visit: Payer: Medicare HMO | Admitting: Lab

## 2013-01-03 ENCOUNTER — Ambulatory Visit (HOSPITAL_BASED_OUTPATIENT_CLINIC_OR_DEPARTMENT_OTHER): Payer: Commercial Managed Care - HMO | Admitting: Internal Medicine

## 2013-01-03 ENCOUNTER — Telehealth: Payer: Self-pay | Admitting: Internal Medicine

## 2013-01-03 VITALS — BP 132/71 | HR 105 | Temp 96.8°F | Resp 20 | Ht 70.0 in | Wt 184.8 lb

## 2013-01-03 DIAGNOSIS — C3490 Malignant neoplasm of unspecified part of unspecified bronchus or lung: Secondary | ICD-10-CM
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
## 2013-01-03 NOTE — Telephone Encounter (Signed)
Gave pt appt for lab and MD in December , gaven pt oral contrast for CT

## 2013-04-01 ENCOUNTER — Other Ambulatory Visit (HOSPITAL_BASED_OUTPATIENT_CLINIC_OR_DEPARTMENT_OTHER): Payer: Medicare HMO

## 2013-04-01 ENCOUNTER — Ambulatory Visit (HOSPITAL_COMMUNITY)
Admission: RE | Admit: 2013-04-01 | Discharge: 2013-04-01 | Disposition: A | Discharge status: Home / Self Care | Payer: Medicare HMO | Source: Ambulatory Visit | Attending: Internal Medicine | Admitting: Internal Medicine

## 2013-04-01 DIAGNOSIS — K8689 Other specified diseases of pancreas: Secondary | ICD-10-CM | POA: Diagnosis not present

## 2013-04-01 DIAGNOSIS — C3490 Malignant neoplasm of unspecified part of unspecified bronchus or lung: Secondary | ICD-10-CM

## 2013-04-01 DIAGNOSIS — I2510 Atherosclerotic heart disease of native coronary artery without angina pectoris: Secondary | ICD-10-CM | POA: Insufficient documentation

## 2013-04-01 DIAGNOSIS — I700 Atherosclerosis of aorta: Secondary | ICD-10-CM | POA: Insufficient documentation

## 2013-04-01 DIAGNOSIS — K7689 Other specified diseases of liver: Secondary | ICD-10-CM | POA: Insufficient documentation

## 2013-04-01 DIAGNOSIS — J984 Other disorders of lung: Secondary | ICD-10-CM | POA: Diagnosis not present

## 2013-04-01 DIAGNOSIS — C3430 Malignant neoplasm of lower lobe, unspecified bronchus or lung: Secondary | ICD-10-CM

## 2013-04-01 LAB — COMPREHENSIVE METABOLIC PANEL (CC13)
ALT: 28 U/L (ref 0–55)
AST: 25 U/L (ref 5–34)
CO2: 27 mEq/L (ref 22–29)
Chloride: 102 mEq/L (ref 98–109)
Creatinine: 1.1 mg/dL (ref 0.7–1.3)
Sodium: 138 mEq/L (ref 136–145)
Total Bilirubin: 0.56 mg/dL (ref 0.20–1.20)
Total Protein: 7.6 g/dL (ref 6.4–8.3)

## 2013-04-01 LAB — CBC WITH DIFFERENTIAL/PLATELET
BASO%: 0.7 % (ref 0.0–2.0)
Eosinophils Absolute: 0.2 10*3/uL (ref 0.0–0.5)
LYMPH%: 30.8 % (ref 14.0–49.0)
MCHC: 35.6 g/dL (ref 32.0–36.0)
MONO#: 1.1 10*3/uL — ABNORMAL HIGH (ref 0.1–0.9)
NEUT#: 4.5 10*3/uL (ref 1.5–6.5)
Platelets: 212 10*3/uL (ref 140–400)
RBC: 4.76 10*6/uL (ref 4.20–5.82)
RDW: 14.2 % (ref 11.0–14.6)
WBC: 8.4 10*3/uL (ref 4.0–10.3)
nRBC: 0 % (ref 0–0)

## 2013-04-01 MED ORDER — IOHEXOL 300 MG/ML  SOLN
100.0000 mL | Freq: Once | INTRAMUSCULAR | Status: AC | PRN
  Administered 2013-04-01: 100 mL via INTRAVENOUS

## 2013-04-04 ENCOUNTER — Telehealth: Payer: Self-pay | Admitting: Internal Medicine

## 2013-04-04 ENCOUNTER — Ambulatory Visit (HOSPITAL_BASED_OUTPATIENT_CLINIC_OR_DEPARTMENT_OTHER): Payer: Medicare HMO | Admitting: Internal Medicine

## 2013-04-04 ENCOUNTER — Encounter: Payer: Self-pay | Admitting: Internal Medicine

## 2013-04-04 VITALS — BP 154/68 | HR 97 | Temp 97.0°F | Resp 18 | Ht 70.0 in | Wt 190.0 lb

## 2013-04-04 DIAGNOSIS — C3490 Malignant neoplasm of unspecified part of unspecified bronchus or lung: Secondary | ICD-10-CM

## 2013-04-04 DIAGNOSIS — C3430 Malignant neoplasm of lower lobe, unspecified bronchus or lung: Secondary | ICD-10-CM

## 2013-04-04 DIAGNOSIS — F17200 Nicotine dependence, unspecified, uncomplicated: Secondary | ICD-10-CM

## 2013-04-04 NOTE — Patient Instructions (Signed)
Followup visit in 4 months with repeat CT scan of the chest, abdomen and pelvis.

## 2013-04-04 NOTE — Telephone Encounter (Signed)
Gave pt appt for lab and Md, for April 2015, gave pt oral contrast for CT
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
## 2013-07-14 ENCOUNTER — Encounter: Payer: Self-pay | Admitting: Family Medicine

## 2013-07-14 ENCOUNTER — Ambulatory Visit (INDEPENDENT_AMBULATORY_CARE_PROVIDER_SITE_OTHER): Payer: Medicare HMO | Admitting: Family Medicine

## 2013-07-14 VITALS — BP 116/78 | HR 96 | Temp 97.8°F | Resp 16 | Wt 189.0 lb

## 2013-07-14 DIAGNOSIS — C3490 Malignant neoplasm of unspecified part of unspecified bronchus or lung: Secondary | ICD-10-CM

## 2013-07-14 DIAGNOSIS — F17200 Nicotine dependence, unspecified, uncomplicated: Secondary | ICD-10-CM

## 2013-07-14 MED ORDER — GABAPENTIN 300 MG PO CAPS: 300.0000 mg | ORAL_CAPSULE | Freq: Every evening | ORAL | Status: DC | PRN

## 2013-07-14 MED ORDER — ALBUTEROL SULFATE HFA 108 (90 BASE) MCG/ACT IN AERS: 1.0000 | INHALATION_SPRAY | RESPIRATORY_TRACT | Status: DC | PRN

## 2013-07-14 NOTE — Patient Instructions (Signed)
I sent your gabapentin and albuterol to the [facility].  Please let me know if you need anything.  Jahnay Lantier M. Nisaiah Bechtol, M.D.

## 2013-07-17 NOTE — Assessment & Plan Note (Signed)
Followed by oncology - Refilled Albuterol to use prn - Refilled gabapentin - Con't to get labs at cancer center - F/u with me in 6 months, or sooner if needed.

## 2013-07-17 NOTE — Progress Notes (Signed)
Patient ID: ALDO SONDGEROTH, male   DOB: 07-07-1933, 80 y.o.   MRN: [id number]    Subjective: HPI: Patient is a 80 y.o. male presenting to clinic today for annual check up. Concerns today include: Medication refiils  Lung cancer- Followed by cancer center. Stable at this time. Would like refill on Albuterol to use as needed for SOB. States he takes Gabapentin for his stomach after he eats to help with his nerves. He has been stable on this for a long time. Labs are UTD except lipid panel, which he states he does not want at this time.  History Reviewed: Daily smoker. No interest in quitting Health Maintenance: Declines Zostavax, Tdap and Colonoscopy.  ROS: Please see HPI above.  Objective: Office vital signs reviewed. BP 116/78  Pulse 96  Temp(Src) 97.8 F (36.6 C) (Oral)  Resp 16  Wt 189 lb (85.73 kg)  Physical Examination:  General: Awake, alert. NAD HEENT: Atraumatic, normocephalic Neck: No masses palpated. No LAD Pulm: CTAB, mild wheezes at bases Cardio: RRR, no murmurs appreciated Abdomen:+BS, soft, nontender, nondistended Extremities: No edema Neuro: Grossly intact  Assessment: 80 y.o. male follow up  Plan: See Problem List and After Visit Summary

## 2013-08-03 ENCOUNTER — Other Ambulatory Visit (HOSPITAL_BASED_OUTPATIENT_CLINIC_OR_DEPARTMENT_OTHER): Payer: Commercial Managed Care - HMO

## 2013-08-03 ENCOUNTER — Ambulatory Visit (HOSPITAL_COMMUNITY)
Admission: RE | Admit: 2013-08-03 | Discharge: 2013-08-03 | Disposition: A | Discharge status: Home / Self Care | Payer: Medicare HMO | Source: Ambulatory Visit | Attending: Internal Medicine | Admitting: Internal Medicine

## 2013-08-03 ENCOUNTER — Encounter (HOSPITAL_COMMUNITY): Payer: Self-pay

## 2013-08-03 ENCOUNTER — Ambulatory Visit: Payer: Medicare HMO | Admitting: Internal Medicine

## 2013-08-03 DIAGNOSIS — C3490 Malignant neoplasm of unspecified part of unspecified bronchus or lung: Secondary | ICD-10-CM

## 2013-08-03 DIAGNOSIS — Z9089 Acquired absence of other organs: Secondary | ICD-10-CM | POA: Insufficient documentation

## 2013-08-03 DIAGNOSIS — C3430 Malignant neoplasm of lower lobe, unspecified bronchus or lung: Secondary | ICD-10-CM

## 2013-08-03 DIAGNOSIS — R911 Solitary pulmonary nodule: Secondary | ICD-10-CM | POA: Diagnosis not present

## 2013-08-03 LAB — CBC WITH DIFFERENTIAL/PLATELET
BASO%: 0.9 % (ref 0.0–2.0)
Basophils Absolute: 0.1 10*3/uL (ref 0.0–0.1)
EOS ABS: 0.2 10*3/uL (ref 0.0–0.5)
EOS%: 1.9 % (ref 0.0–7.0)
HCT: 46.1 % (ref 38.4–49.9)
HGB: 15.8 g/dL (ref 13.0–17.1)
LYMPH%: 24.3 % (ref 14.0–49.0)
MCH: 33.6 pg — ABNORMAL HIGH (ref 27.2–33.4)
MCHC: 34.3 g/dL (ref 32.0–36.0)
MCV: 97.8 fL (ref 79.3–98.0)
MONO#: 1.7 10*3/uL — AB (ref 0.1–0.9)
MONO%: 15.3 % — ABNORMAL HIGH (ref 0.0–14.0)
NEUT%: 57.6 % (ref 39.0–75.0)
NEUTROS ABS: 6.5 10*3/uL (ref 1.5–6.5)
Platelets: 234 10*3/uL (ref 140–400)
RBC: 4.72 10*6/uL (ref 4.20–5.82)
RDW: 14.7 % — ABNORMAL HIGH (ref 11.0–14.6)
WBC: 11.2 10*3/uL — ABNORMAL HIGH (ref 4.0–10.3)
lymph#: 2.7 10*3/uL (ref 0.9–3.3)

## 2013-08-03 LAB — COMPREHENSIVE METABOLIC PANEL (CC13)
ALBUMIN: 3.8 g/dL (ref 3.5–5.0)
ALT: 22 U/L (ref 0–55)
AST: 22 U/L (ref 5–34)
Alkaline Phosphatase: 106 U/L (ref 40–150)
Anion Gap: 8 mEq/L (ref 3–11)
BUN: 14.2 mg/dL (ref 7.0–26.0)
CALCIUM: 9.7 mg/dL (ref 8.4–10.4)
CO2: 29 mEq/L (ref 22–29)
Chloride: 102 mEq/L (ref 98–109)
Creatinine: 1.1 mg/dL (ref 0.7–1.3)
GLUCOSE: 99 mg/dL (ref 70–140)
POTASSIUM: 4.4 meq/L (ref 3.5–5.1)
Sodium: 139 mEq/L (ref 136–145)
Total Bilirubin: 0.55 mg/dL (ref 0.20–1.20)
Total Protein: 7.5 g/dL (ref 6.4–8.3)

## 2013-08-03 MED ORDER — IOHEXOL 300 MG/ML  SOLN
100.0000 mL | Freq: Once | INTRAMUSCULAR | Status: AC | PRN
  Administered 2013-08-03: 100 mL via INTRAVENOUS

## 2013-08-04 ENCOUNTER — Ambulatory Visit (HOSPITAL_BASED_OUTPATIENT_CLINIC_OR_DEPARTMENT_OTHER): Payer: Commercial Managed Care - HMO | Admitting: Internal Medicine

## 2013-08-04 ENCOUNTER — Encounter: Payer: Self-pay | Admitting: Internal Medicine

## 2013-08-04 VITALS — BP 133/60 | HR 92 | Temp 97.9°F | Resp 18 | Ht 70.0 in | Wt 190.0 lb

## 2013-08-04 DIAGNOSIS — C3490 Malignant neoplasm of unspecified part of unspecified bronchus or lung: Secondary | ICD-10-CM

## 2013-08-04 DIAGNOSIS — C3430 Malignant neoplasm of lower lobe, unspecified bronchus or lung: Secondary | ICD-10-CM

## 2013-08-04 DIAGNOSIS — F17200 Nicotine dependence, unspecified, uncomplicated: Secondary | ICD-10-CM
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
## 2013-08-04 NOTE — Patient Instructions (Signed)
Smoking Cessation Quitting smoking is important to your health and has many advantages. However, it is not always easy to quit since nicotine is a very addictive drug. Often times, people try 3 times or more before being able to quit. This document explains the best ways for you to prepare to quit smoking. Quitting takes hard work and a lot of effort, but you can do it. ADVANTAGES OF QUITTING SMOKING  You will live longer, feel better, and live better.  Your body will feel the impact of quitting smoking almost immediately.  Within 20 minutes, blood pressure decreases. Your pulse returns to its normal level.  After 8 hours, carbon monoxide levels in the blood return to normal. Your oxygen level increases.  After 24 hours, the chance of having a heart attack starts to decrease. Your breath, hair, and body stop smelling like smoke.  After 48 hours, damaged nerve endings begin to recover. Your sense of taste and smell improve.  After 72 hours, the body is virtually free of nicotine. Your bronchial tubes relax and breathing becomes easier.  After 2 to 12 weeks, lungs can hold more air. Exercise becomes easier and circulation improves.  The risk of having a heart attack, stroke, cancer, or lung disease is greatly reduced.  After 1 year, the risk of coronary heart disease is cut in half.  After 5 years, the risk of stroke falls to the same as a nonsmoker.  After 10 years, the risk of lung cancer is cut in half and the risk of other cancers decreases significantly.  After 15 years, the risk of coronary heart disease drops, usually to the level of a nonsmoker.  If you are pregnant, quitting smoking will improve your chances of having a healthy baby.  The people you live with, especially any children, will be healthier.  You will have extra money to spend on things other than cigarettes. QUESTIONS TO THINK ABOUT BEFORE ATTEMPTING TO QUIT You may want to talk about your answers with your  caregiver.  Why do you want to quit?  If you tried to quit in the past, what helped and what did not?  What will be the most difficult situations for you after you quit? How will you plan to handle them?  Who can help you through the tough times? Your family? Friends? A caregiver?  What pleasures do you get from smoking? What ways can you still get pleasure if you quit? Here are some questions to ask your caregiver:  How can you help me to be successful at quitting?  What medicine do you think would be best for me and how should I take it?  What should I do if I need more help?  What is smoking withdrawal like? How can I get information on withdrawal? GET READY  Set a quit date.  Change your environment by getting rid of all cigarettes, ashtrays, matches, and lighters in your home, car, or work. Do not let people smoke in your home.  Review your past attempts to quit. Think about what worked and what did not. GET SUPPORT AND ENCOURAGEMENT You have a better chance of being successful if you have help. You can get support in many ways.  Tell your family, friends, and co-workers that you are going to quit and need their support. Ask them not to smoke around you.  Get individual, group, or telephone counseling and support. Programs are available at local hospitals and health centers. Call your local health department for   information about programs in your area.  Spiritual beliefs and practices may help some smokers quit.  Download a "quit meter" on your computer to keep track of quit statistics, such as how long you have gone without smoking, cigarettes not smoked, and money saved.  Get a self-help book about quitting smoking and staying off of tobacco. LEARN NEW SKILLS AND BEHAVIORS  Distract yourself from urges to smoke. Talk to someone, go for a walk, or occupy your time with a task.  Change your normal routine. Take a different route to work. Drink tea instead of coffee.  Eat breakfast in a different place.  Reduce your stress. Take a hot bath, exercise, or read a book.  Plan something enjoyable to do every day. Reward yourself for not smoking.  Explore interactive web-based programs that specialize in helping you quit. GET MEDICINE AND USE IT CORRECTLY Medicines can help you stop smoking and decrease the urge to smoke. Combining medicine with the above behavioral methods and support can greatly increase your chances of successfully quitting smoking.  Nicotine replacement therapy helps deliver nicotine to your body without the negative effects and risks of smoking. Nicotine replacement therapy includes nicotine gum, lozenges, inhalers, nasal sprays, and skin patches. Some may be available over-the-counter and others require a prescription.  Antidepressant medicine helps people abstain from smoking, but how this works is unknown. This medicine is available by prescription.  Nicotinic receptor partial agonist medicine simulates the effect of nicotine in your brain. This medicine is available by prescription. Ask your caregiver for advice about which medicines to use and how to use them based on your health history. Your caregiver will tell you what side effects to look out for if you choose to be on a medicine or therapy. Carefully read the information on the package. Do not use any other product containing nicotine while using a nicotine replacement product.  RELAPSE OR DIFFICULT SITUATIONS Most relapses occur within the first 3 months after quitting. Do not be discouraged if you start smoking again. Remember, most people try several times before finally quitting. You may have symptoms of withdrawal because your body is used to nicotine. You may crave cigarettes, be irritable, feel very hungry, cough often, get headaches, or have difficulty concentrating. The withdrawal symptoms are only temporary. They are strongest when you first quit, but they will go away within  10 14 days. To reduce the chances of relapse, try to:  Avoid drinking alcohol. Drinking lowers your chances of successfully quitting.  Reduce the amount of caffeine you consume. Once you quit smoking, the amount of caffeine in your body increases and can give you symptoms, such as a rapid heartbeat, sweating, and anxiety.  Avoid smokers because they can make you want to smoke.  Do not let weight gain distract you. Many smokers will gain weight when they quit, usually less than 10 pounds. Eat a healthy diet and stay active. You can always lose the weight gained after you quit.  Find ways to improve your mood other than smoking. FOR MORE INFORMATION  www.smokefree.gov  Document Released: 04/01/2001 Document Revised: 10/07/2011 Document Reviewed: 07/17/2011 [employer] Patient Information 2014 [employer]  

## 2013-08-05 ENCOUNTER — Telehealth: Payer: Self-pay | Admitting: Internal Medicine

## 2013-08-05 NOTE — Telephone Encounter (Signed)
s.w. pt and advised on July appt....pt ok adn aware

## 2013-11-03 ENCOUNTER — Encounter (HOSPITAL_COMMUNITY): Payer: Self-pay

## 2013-11-03 ENCOUNTER — Other Ambulatory Visit (HOSPITAL_BASED_OUTPATIENT_CLINIC_OR_DEPARTMENT_OTHER): Payer: Commercial Managed Care - HMO

## 2013-11-03 ENCOUNTER — Ambulatory Visit (HOSPITAL_COMMUNITY)
Admission: RE | Admit: 2013-11-03 | Discharge: 2013-11-03 | Disposition: A | Discharge status: Home / Self Care | Payer: Medicare HMO | Source: Ambulatory Visit | Attending: Internal Medicine | Admitting: Internal Medicine

## 2013-11-03 DIAGNOSIS — I7090 Unspecified atherosclerosis: Secondary | ICD-10-CM | POA: Insufficient documentation

## 2013-11-03 DIAGNOSIS — I2510 Atherosclerotic heart disease of native coronary artery without angina pectoris: Secondary | ICD-10-CM | POA: Diagnosis not present

## 2013-11-03 DIAGNOSIS — K7460 Unspecified cirrhosis of liver: Secondary | ICD-10-CM | POA: Diagnosis not present

## 2013-11-03 DIAGNOSIS — C3490 Malignant neoplasm of unspecified part of unspecified bronchus or lung: Secondary | ICD-10-CM

## 2013-11-03 DIAGNOSIS — I517 Cardiomegaly: Secondary | ICD-10-CM | POA: Diagnosis not present

## 2013-11-03 DIAGNOSIS — C3430 Malignant neoplasm of lower lobe, unspecified bronchus or lung: Secondary | ICD-10-CM

## 2013-11-03 DIAGNOSIS — K7689 Other specified diseases of liver: Secondary | ICD-10-CM | POA: Diagnosis not present

## 2013-11-03 DIAGNOSIS — N281 Cyst of kidney, acquired: Secondary | ICD-10-CM | POA: Diagnosis not present

## 2013-11-03 DIAGNOSIS — J438 Other emphysema: Secondary | ICD-10-CM | POA: Insufficient documentation

## 2013-11-03 DIAGNOSIS — M47817 Spondylosis without myelopathy or radiculopathy, lumbosacral region: Secondary | ICD-10-CM | POA: Diagnosis not present

## 2013-11-03 DIAGNOSIS — K769 Liver disease, unspecified: Secondary | ICD-10-CM | POA: Diagnosis not present

## 2013-11-03 DIAGNOSIS — R0602 Shortness of breath: Secondary | ICD-10-CM | POA: Insufficient documentation

## 2013-11-03 DIAGNOSIS — Z9089 Acquired absence of other organs: Secondary | ICD-10-CM | POA: Diagnosis not present

## 2013-11-03 DIAGNOSIS — K8689 Other specified diseases of pancreas: Secondary | ICD-10-CM | POA: Insufficient documentation

## 2013-11-03 DIAGNOSIS — M87059 Idiopathic aseptic necrosis of unspecified femur: Secondary | ICD-10-CM | POA: Insufficient documentation

## 2013-11-03 LAB — COMPREHENSIVE METABOLIC PANEL (CC13)
ALBUMIN: 3.5 g/dL (ref 3.5–5.0)
ALT: 29 U/L (ref 0–55)
AST: 25 U/L (ref 5–34)
Alkaline Phosphatase: 98 U/L (ref 40–150)
Anion Gap: 7 mEq/L (ref 3–11)
BUN: 15.9 mg/dL (ref 7.0–26.0)
CO2: 27 mEq/L (ref 22–29)
Calcium: 9.3 mg/dL (ref 8.4–10.4)
Chloride: 101 mEq/L (ref 98–109)
Creatinine: 1.1 mg/dL (ref 0.7–1.3)
Glucose: 94 mg/dl (ref 70–140)
POTASSIUM: 4.2 meq/L (ref 3.5–5.1)
Sodium: 135 mEq/L — ABNORMAL LOW (ref 136–145)
Total Bilirubin: 0.65 mg/dL (ref 0.20–1.20)
Total Protein: 7.2 g/dL (ref 6.4–8.3)

## 2013-11-03 LAB — CBC WITH DIFFERENTIAL/PLATELET
BASO%: 0.6 % (ref 0.0–2.0)
Basophils Absolute: 0.1 10*3/uL (ref 0.0–0.1)
EOS%: 2.2 % (ref 0.0–7.0)
Eosinophils Absolute: 0.2 10*3/uL (ref 0.0–0.5)
HCT: 42.6 % (ref 38.4–49.9)
HGB: 14.9 g/dL (ref 13.0–17.1)
LYMPH%: 29 % (ref 14.0–49.0)
MCH: 33 pg (ref 27.2–33.4)
MCHC: 35 g/dL (ref 32.0–36.0)
MCV: 94.2 fL (ref 79.3–98.0)
MONO#: 1.7 10*3/uL — ABNORMAL HIGH (ref 0.1–0.9)
MONO%: 18.3 % — AB (ref 0.0–14.0)
NEUT#: 4.7 10*3/uL (ref 1.5–6.5)
NEUT%: 49.9 % (ref 39.0–75.0)
Platelets: 205 10*3/uL (ref 140–400)
RBC: 4.52 10*6/uL (ref 4.20–5.82)
RDW: 14.3 % (ref 11.0–14.6)
WBC: 9.4 10*3/uL (ref 4.0–10.3)
lymph#: 2.7 10*3/uL (ref 0.9–3.3)

## 2013-11-03 MED ORDER — IOHEXOL 300 MG/ML  SOLN
100.0000 mL | Freq: Once | INTRAMUSCULAR | Status: AC | PRN
  Administered 2013-11-03: 100 mL via INTRAVENOUS

## 2013-11-10 ENCOUNTER — Encounter: Payer: Self-pay | Admitting: Internal Medicine

## 2013-11-10 ENCOUNTER — Ambulatory Visit (HOSPITAL_BASED_OUTPATIENT_CLINIC_OR_DEPARTMENT_OTHER): Payer: Commercial Managed Care - HMO | Admitting: Internal Medicine

## 2013-11-10 ENCOUNTER — Telehealth: Payer: Self-pay | Admitting: Internal Medicine

## 2013-11-10 VITALS — BP 122/57 | HR 85 | Temp 98.0°F | Resp 18 | Ht 70.0 in | Wt 189.1 lb

## 2013-11-10 DIAGNOSIS — F17200 Nicotine dependence, unspecified, uncomplicated: Secondary | ICD-10-CM

## 2013-11-10 DIAGNOSIS — C3430 Malignant neoplasm of lower lobe, unspecified bronchus or lung: Secondary | ICD-10-CM

## 2013-11-10 DIAGNOSIS — C3492 Malignant neoplasm of unspecified part of left bronchus or lung: Secondary | ICD-10-CM
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
## 2013-11-10 NOTE — Telephone Encounter (Signed)
Pt confirmed labs/ov per 07/23 POF, gave pt AVS...Marland KitchenMarland KitchenKJ

## 2013-11-10 NOTE — Patient Instructions (Signed)
Smoking Cessation Quitting smoking is important to your health and has many advantages. However, it is not always easy to quit since nicotine is a very addictive drug. Oftentimes, people try 3 times or more before being able to quit. This document explains the best ways for you to prepare to quit smoking. Quitting takes hard work and a lot of effort, but you can do it. ADVANTAGES OF QUITTING SMOKING  You will live longer, feel better, and live better.  Your body will feel the impact of quitting smoking almost immediately.  Within 20 minutes, blood pressure decreases. Your pulse returns to its normal level.  After 8 hours, carbon monoxide levels in the blood return to normal. Your oxygen level increases.  After 24 hours, the chance of having a heart attack starts to decrease. Your breath, hair, and body stop smelling like smoke.  After 48 hours, damaged nerve endings begin to recover. Your sense of taste and smell improve.  After 72 hours, the body is virtually free of nicotine. Your bronchial tubes relax and breathing becomes easier.  After 2 to 12 weeks, lungs can hold more air. Exercise becomes easier and circulation improves.  The risk of having a heart attack, stroke, cancer, or lung disease is greatly reduced.  After 1 year, the risk of coronary heart disease is cut in half.  After 5 years, the risk of stroke falls to the same as a nonsmoker.  After 10 years, the risk of lung cancer is cut in half and the risk of other cancers decreases significantly.  After 15 years, the risk of coronary heart disease drops, usually to the level of a nonsmoker.  If you are pregnant, quitting smoking will improve your chances of having a healthy baby.  The people you live with, especially any children, will be healthier.  You will have extra money to spend on things other than cigarettes. QUESTIONS TO THINK ABOUT BEFORE ATTEMPTING TO QUIT You may want to talk about your answers with your  health care provider.  Why do you want to quit?  If you tried to quit in the past, what helped and what did not?  What will be the most difficult situations for you after you quit? How will you plan to handle them?  Who can help you through the tough times? Your family? Friends? A health care provider?  What pleasures do you get from smoking? What ways can you still get pleasure if you quit? Here are some questions to ask your health care provider:  How can you help me to be successful at quitting?  What medicine do you think would be best for me and how should I take it?  What should I do if I need more help?  What is smoking withdrawal like? How can I get information on withdrawal? GET READY  Set a quit date.  Change your environment by getting rid of all cigarettes, ashtrays, matches, and lighters in your home, car, or work. Do not let people smoke in your home.  Review your past attempts to quit. Think about what worked and what did not. GET SUPPORT AND ENCOURAGEMENT You have a better chance of being successful if you have help. You can get support in many ways.  Tell your family, friends, and coworkers that you are going to quit and need their support. Ask them not to smoke around you.  Get individual, group, or telephone counseling and support. Programs are available at local hospitals and health centers. Call   your local health department for information about programs in your area.  Spiritual beliefs and practices may help some smokers quit.  Download a "quit meter" on your computer to keep track of quit statistics, such as how long you have gone without smoking, cigarettes not smoked, and money saved.  Get a self-help book about quitting smoking and staying off tobacco. LEARN NEW SKILLS AND BEHAVIORS  Distract yourself from urges to smoke. Talk to someone, go for a walk, or occupy your time with a task.  Change your normal routine. Take a different route to work.  Drink tea instead of coffee. Eat breakfast in a different place.  Reduce your stress. Take a hot bath, exercise, or read a book.  Plan something enjoyable to do every day. Reward yourself for not smoking.  Explore interactive web-based programs that specialize in helping you quit. GET MEDICINE AND USE IT CORRECTLY Medicines can help you stop smoking and decrease the urge to smoke. Combining medicine with the above behavioral methods and support can greatly increase your chances of successfully quitting smoking.  Nicotine replacement therapy helps deliver nicotine to your body without the negative effects and risks of smoking. Nicotine replacement therapy includes nicotine gum, lozenges, inhalers, nasal sprays, and skin patches. Some may be available over-the-counter and others require a prescription.  Antidepressant medicine helps people abstain from smoking, but how this works is unknown. This medicine is available by prescription.  Nicotinic receptor partial agonist medicine simulates the effect of nicotine in your brain. This medicine is available by prescription. Ask your health care provider for advice about which medicines to use and how to use them based on your health history. Your health care provider will tell you what side effects to look out for if you choose to be on a medicine or therapy. Carefully read the information on the package. Do not use any other product containing nicotine while using a nicotine replacement product.  RELAPSE OR DIFFICULT SITUATIONS Most relapses occur within the first 3 months after quitting. Do not be discouraged if you start smoking again. Remember, most people try several times before finally quitting. You may have symptoms of withdrawal because your body is used to nicotine. You may crave cigarettes, be irritable, feel very hungry, cough often, get headaches, or have difficulty concentrating. The withdrawal symptoms are only temporary. They are strongest  when you first quit, but they will go away within 10-14 days. To reduce the chances of relapse, try to:  Avoid drinking alcohol. Drinking lowers your chances of successfully quitting.  Reduce the amount of caffeine you consume. Once you quit smoking, the amount of caffeine in your body increases and can give you symptoms, such as a rapid heartbeat, sweating, and anxiety.  Avoid smokers because they can make you want to smoke.  Do not let weight gain distract you. Many smokers will gain weight when they quit, usually less than 10 pounds. Eat a healthy diet and stay active. You can always lose the weight gained after you quit.  Find ways to improve your mood other than smoking. FOR MORE INFORMATION  www.smokefree.gov  Document Released: 04/01/2001 Document Revised: 08/22/2013 Document Reviewed: 07/17/2011 [employer] Patient Information 2015 [employer]. This information is not intended to replace advice given to you by your health care provider. Make sure you discuss any questions you have with your health care provider.  

## 2014-02-10 ENCOUNTER — Encounter (HOSPITAL_COMMUNITY): Payer: Self-pay

## 2014-02-10 ENCOUNTER — Ambulatory Visit (HOSPITAL_COMMUNITY)
Admission: RE | Admit: 2014-02-10 | Discharge: 2014-02-10 | Disposition: A | Discharge status: Home / Self Care | Payer: Commercial Managed Care - HMO | Source: Ambulatory Visit | Attending: Internal Medicine | Admitting: Internal Medicine

## 2014-02-10 ENCOUNTER — Other Ambulatory Visit (HOSPITAL_BASED_OUTPATIENT_CLINIC_OR_DEPARTMENT_OTHER): Payer: Commercial Managed Care - HMO

## 2014-02-10 DIAGNOSIS — C3492 Malignant neoplasm of unspecified part of left bronchus or lung: Secondary | ICD-10-CM | POA: Diagnosis not present

## 2014-02-10 DIAGNOSIS — Z85118 Personal history of other malignant neoplasm of bronchus and lung: Secondary | ICD-10-CM | POA: Insufficient documentation

## 2014-02-10 DIAGNOSIS — I700 Atherosclerosis of aorta: Secondary | ICD-10-CM | POA: Insufficient documentation

## 2014-02-10 DIAGNOSIS — C3430 Malignant neoplasm of lower lobe, unspecified bronchus or lung: Secondary | ICD-10-CM

## 2014-02-10 DIAGNOSIS — Z9221 Personal history of antineoplastic chemotherapy: Secondary | ICD-10-CM | POA: Diagnosis not present

## 2014-02-10 DIAGNOSIS — R918 Other nonspecific abnormal finding of lung field: Secondary | ICD-10-CM | POA: Diagnosis not present

## 2014-02-10 DIAGNOSIS — Z923 Personal history of irradiation: Secondary | ICD-10-CM | POA: Insufficient documentation

## 2014-02-10 DIAGNOSIS — K7460 Unspecified cirrhosis of liver: Secondary | ICD-10-CM | POA: Insufficient documentation

## 2014-02-10 LAB — CBC WITH DIFFERENTIAL/PLATELET
BASO%: 0.6 % (ref 0.0–2.0)
BASOS ABS: 0.1 10*3/uL (ref 0.0–0.1)
EOS%: 2.2 % (ref 0.0–7.0)
Eosinophils Absolute: 0.2 10*3/uL (ref 0.0–0.5)
HCT: 42.2 % (ref 38.4–49.9)
HGB: 14.8 g/dL (ref 13.0–17.1)
LYMPH%: 25.6 % (ref 14.0–49.0)
MCH: 33 pg (ref 27.2–33.4)
MCHC: 35.1 g/dL (ref 32.0–36.0)
MCV: 94 fL (ref 79.3–98.0)
MONO#: 1.5 10*3/uL — ABNORMAL HIGH (ref 0.1–0.9)
MONO%: 16.3 % — AB (ref 0.0–14.0)
NEUT#: 5.2 10*3/uL (ref 1.5–6.5)
NEUT%: 55.3 % (ref 39.0–75.0)
PLATELETS: 207 10*3/uL (ref 140–400)
RBC: 4.49 10*6/uL (ref 4.20–5.82)
RDW: 14.9 % — ABNORMAL HIGH (ref 11.0–14.6)
WBC: 9.5 10*3/uL (ref 4.0–10.3)
lymph#: 2.4 10*3/uL (ref 0.9–3.3)

## 2014-02-10 LAB — COMPREHENSIVE METABOLIC PANEL (CC13)
ALK PHOS: 103 U/L (ref 40–150)
ALT: 35 U/L (ref 0–55)
AST: 27 U/L (ref 5–34)
Albumin: 3.4 g/dL — ABNORMAL LOW (ref 3.5–5.0)
Anion Gap: 7 mEq/L (ref 3–11)
BILIRUBIN TOTAL: 0.55 mg/dL (ref 0.20–1.20)
BUN: 16.5 mg/dL (ref 7.0–26.0)
CO2: 27 mEq/L (ref 22–29)
Calcium: 9.4 mg/dL (ref 8.4–10.4)
Chloride: 103 mEq/L (ref 98–109)
Creatinine: 1.1 mg/dL (ref 0.7–1.3)
GLUCOSE: 116 mg/dL (ref 70–140)
Potassium: 4.5 mEq/L (ref 3.5–5.1)
SODIUM: 137 meq/L (ref 136–145)
Total Protein: 6.8 g/dL (ref 6.4–8.3)

## 2014-02-10 MED ORDER — IOHEXOL 300 MG/ML  SOLN
80.0000 mL | Freq: Once | INTRAMUSCULAR | Status: AC | PRN
Start: 2014-02-10 — End: 2014-02-10
  Administered 2014-02-10: 80 mL via INTRAVENOUS

## 2014-02-16 ENCOUNTER — Telehealth: Payer: Self-pay | Admitting: Internal Medicine

## 2014-02-16 ENCOUNTER — Ambulatory Visit (HOSPITAL_BASED_OUTPATIENT_CLINIC_OR_DEPARTMENT_OTHER): Payer: Commercial Managed Care - HMO | Admitting: Internal Medicine

## 2014-02-16 ENCOUNTER — Ambulatory Visit (HOSPITAL_BASED_OUTPATIENT_CLINIC_OR_DEPARTMENT_OTHER): Payer: Commercial Managed Care - HMO

## 2014-02-16 ENCOUNTER — Other Ambulatory Visit: Payer: Self-pay | Admitting: Medical Oncology

## 2014-02-16 ENCOUNTER — Encounter: Payer: Self-pay | Admitting: Internal Medicine

## 2014-02-16 VITALS — BP 133/63 | HR 106 | Temp 97.7°F | Resp 18 | Ht 70.0 in | Wt 185.3 lb

## 2014-02-16 DIAGNOSIS — Z23 Encounter for immunization: Secondary | ICD-10-CM

## 2014-02-16 DIAGNOSIS — C3490 Malignant neoplasm of unspecified part of unspecified bronchus or lung: Secondary | ICD-10-CM

## 2014-02-16 MED ORDER — INFLUENZA VAC SPLIT QUAD 0.5 ML IM SUSY
0.5000 mL | PREFILLED_SYRINGE | Freq: Once | INTRAMUSCULAR | Status: AC
  Administered 2014-02-16: 0.5 mL via INTRAMUSCULAR
  Filled 2014-02-16: qty 0.5
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
## 2014-02-16 NOTE — Patient Instructions (Signed)

## 2014-02-16 NOTE — Telephone Encounter (Signed)
gv and printed appt sched and avs for pt for Jan 2016....no pof

## 2014-04-04 ENCOUNTER — Encounter: Payer: Self-pay | Admitting: Family Medicine

## 2014-04-04 ENCOUNTER — Ambulatory Visit (INDEPENDENT_AMBULATORY_CARE_PROVIDER_SITE_OTHER): Payer: Commercial Managed Care - HMO | Admitting: Family Medicine

## 2014-04-04 VITALS — BP 152/80 | HR 103 | Temp 97.6°F | Wt 186.0 lb

## 2014-04-04 DIAGNOSIS — H6092 Unspecified otitis externa, left ear: Secondary | ICD-10-CM | POA: Diagnosis not present

## 2014-04-04 DIAGNOSIS — H6121 Impacted cerumen, right ear: Secondary | ICD-10-CM

## 2014-04-04 MED ORDER — CIPROFLOXACIN-DEXAMETHASONE 0.3-0.1 % OT SUSP: 4.0000 [drp] | Freq: Two times a day (BID) | OTIC | Status: DC

## 2014-04-04 NOTE — Patient Instructions (Signed)

## 2014-04-04 NOTE — Progress Notes (Signed)
  Subjective:     Austin [name] is a 80 y.o. male who presents for evaluation of left ear pain. Symptoms have been present for 3 days. He also notes a plugged sensation in both ears. He does not have a history of ear infections. He does not have a history of recent swimming.  The patient's history has been marked as reviewed and updated as appropriate.   Review of Systems Pertinent items are noted in HPI.   Objective:    BP 152/80 mmHg  Pulse 103  Temp(Src) 97.6 F (36.4 C) (Oral)  Wt 186 lb (84.369 kg) General:  alert, cooperative and appears stated age  Right Ear: cerumen impaction  Left Ear: left canal red and inflamed  Mouth:  lips, mucosa, and tongue normal; teeth and gums normal  Neck: no adenopathy      No mastoid tenderness B/L Assessment:    Left otitis externa  R cerumen impaction    Plan:    Treatment: Cortisporin. OTC analgesia as needed. Water exclusion from affected ear until symptoms resolve. Follow up in 7 days if symptoms not improving.   Ear cerumen disimpaction

## 2014-04-18 ENCOUNTER — Encounter (HOSPITAL_COMMUNITY): Payer: Self-pay | Admitting: Emergency Medicine

## 2014-04-18 ENCOUNTER — Emergency Department (HOSPITAL_COMMUNITY)
Admission: EM | Admit: 2014-04-18 | Discharge: 2014-04-18 | Disposition: A | Discharge status: Home / Self Care | Payer: Commercial Managed Care - HMO | Attending: Emergency Medicine | Admitting: Emergency Medicine

## 2014-04-18 DIAGNOSIS — Z880 Allergy status to penicillin: Secondary | ICD-10-CM | POA: Insufficient documentation

## 2014-04-18 DIAGNOSIS — Z720 Tobacco use: Secondary | ICD-10-CM | POA: Insufficient documentation

## 2014-04-18 DIAGNOSIS — R040 Epistaxis: Secondary | ICD-10-CM | POA: Diagnosis present

## 2014-04-18 DIAGNOSIS — Z85118 Personal history of other malignant neoplasm of bronchus and lung: Secondary | ICD-10-CM | POA: Diagnosis not present

## 2014-04-18 DIAGNOSIS — Z8673 Personal history of transient ischemic attack (TIA), and cerebral infarction without residual deficits: Secondary | ICD-10-CM | POA: Insufficient documentation

## 2014-04-18 DIAGNOSIS — Z79899 Other long term (current) drug therapy: Secondary | ICD-10-CM | POA: Diagnosis not present

## 2014-04-18 DIAGNOSIS — Z792 Long term (current) use of antibiotics: Secondary | ICD-10-CM | POA: Diagnosis not present

## 2014-04-18 DIAGNOSIS — Z7982 Long term (current) use of aspirin: Secondary | ICD-10-CM | POA: Insufficient documentation

## 2014-04-18 LAB — CBC WITH DIFFERENTIAL/PLATELET
Basophils Absolute: 0.1 10*3/uL (ref 0.0–0.1)
Basophils Relative: 0 % (ref 0–1)
EOS PCT: 1 % (ref 0–5)
Eosinophils Absolute: 0.1 10*3/uL (ref 0.0–0.7)
HCT: 42.2 % (ref 39.0–52.0)
HEMOGLOBIN: 15.1 g/dL (ref 13.0–17.0)
Lymphocytes Relative: 23 % (ref 12–46)
Lymphs Abs: 2.7 10*3/uL (ref 0.7–4.0)
MCH: 33.1 pg (ref 26.0–34.0)
MCHC: 35.8 g/dL (ref 30.0–36.0)
MCV: 92.5 fL (ref 78.0–100.0)
Monocytes Absolute: 1.7 10*3/uL — ABNORMAL HIGH (ref 0.1–1.0)
Monocytes Relative: 14 % — ABNORMAL HIGH (ref 3–12)
Neutro Abs: 7.3 10*3/uL (ref 1.7–7.7)
Neutrophils Relative %: 62 % (ref 43–77)
Platelets: 228 10*3/uL (ref 150–400)
RBC: 4.56 MIL/uL (ref 4.22–5.81)
RDW: 14.6 % (ref 11.5–15.5)
WBC: 11.9 10*3/uL — ABNORMAL HIGH (ref 4.0–10.5)

## 2014-04-18 LAB — BASIC METABOLIC PANEL
Anion gap: 7 (ref 5–15)
BUN: 20 mg/dL (ref 6–23)
CALCIUM: 8.9 mg/dL (ref 8.4–10.5)
CO2: 23 mmol/L (ref 19–32)
Chloride: 103 mEq/L (ref 96–112)
Creatinine, Ser: 1.04 mg/dL (ref 0.50–1.35)
GFR calc Af Amer: 77 mL/min — ABNORMAL LOW (ref >90)
GFR calc non Af Amer: 67 mL/min — ABNORMAL LOW (ref >90)
GLUCOSE: 147 mg/dL — AB (ref 70–99)
Potassium: 4.5 mmol/L (ref 3.5–5.1)
Sodium: 133 mmol/L — ABNORMAL LOW (ref 135–145)

## 2014-04-18 MED ORDER — OXYMETAZOLINE HCL 0.05 % NA SOLN
1.0000 | Freq: Once | NASAL | Status: AC
  Administered 2014-04-18: 1 via NASAL
  Filled 2014-04-18: qty 15

## 2014-04-18 NOTE — ED Notes (Signed)
Pt blew out clot, then took afrin nose spray. No bleeding afterwards. Gave pt washcloths to clean self.

## 2014-04-18 NOTE — ED Provider Notes (Signed)
CSN: [id number]     Arrival date & time 04/18/14  1052 History   First MD Initiated Contact with Patient 04/18/14 1104     Chief Complaint  Patient presents with  . Epistaxis     (Consider location/radiation/quality/duration/timing/severity/associated sxs/prior Treatment) Patient is a 80 y.o. male presenting with nosebleeds.  Epistaxis Location:  L nare Severity:  Moderate Duration:  1 hour Timing:  Constant Progression:  Partially resolved Chronicity:  New Context comment:  Uses 1 baby aspirin daily Relieved by: Placing a wet rag under his nose. Exacerbated by: Blowing nose. Associated symptoms: blood in oropharynx   Associated symptoms: no congestion and no facial pain     Past Medical History  Diagnosis Date  . Stroke     1980S  . Shortness of breath     WITH COUGH  . Blood transfusion   . Cancer 06/2011    lung  . Cancer of lung    Past Surgical History  Procedure Laterality Date  . Cholecystectomy    . Cardiovascular stress test      UNSURE OF DATE  ([facility])   Family History  Problem Relation Age of Onset  . Anesthesia problems Neg Hx   . Hypotension Neg Hx   . Malignant hyperthermia Neg Hx   . Pseudochol deficiency Neg Hx    History  Substance Use Topics  . Smoking status: Current Every Day Smoker -- 0.50 packs/day for 50 years    Types: Cigarettes  . Smokeless tobacco: Never Used     Comment: 10 cigs a day  . Alcohol Use: No     Comment: quit 2000    Review of Systems  HENT: Positive for nosebleeds. Negative for congestion.   All other systems reviewed and are negative.     Allergies  Penicillins  Home Medications   Prior to Admission medications   Medication Sig Start Date End Date Taking? Authorizing Provider  albuterol (PROAIR HFA) 108 (90 BASE) MCG/ACT inhaler Inhale 1-2 puffs into the lungs every 4 (four) hours as needed for wheezing or shortness of breath. 07/14/13   Montez Morita, MD  aspirin 81 MG tablet Take 81 mg by  mouth daily.    Historical Provider, MD  ciprofloxacin-dexamethasone (CIPRODEX) otic suspension Place 4 drops into both ears 2 (two) times daily. 04/04/14   Nolon Rod, DO  feeding supplement (ENSURE COMPLETE) LIQD Take 237 mLs by mouth 2 (two) times daily between meals. 01/29/12   Domenic Polite, MD  FLUVIRIN INJ injection  01/07/13   Historical Provider, MD   BP 135/89 mmHg  Pulse 135  Temp(Src) 97.6 F (36.4 C)  Resp 20  SpO2 98% Physical Exam  Constitutional: He is oriented to person, place, and time. He appears well-developed and well-nourished. No distress.  HENT:  Head: Normocephalic and atraumatic.  Nose: Epistaxis (Blood clot and left there. No bleeding in right near, but some mild swelling of turbinates.) is observed.  Eyes: Conjunctivae are normal. No scleral icterus.  Neck: Neck supple.  Cardiovascular: Normal rate and intact distal pulses.   Pulmonary/Chest: Effort normal. No stridor. No respiratory distress.  Abdominal: Normal appearance. He exhibits no distension.  Neurological: He is alert and oriented to person, place, and time.  Skin: Skin is warm and dry. No rash noted.  Psychiatric: He has a normal mood and affect. His behavior is normal.  Nursing note and vitals reviewed.   ED Course  Procedures (including critical care time) Labs Review Labs Reviewed  CBC WITH DIFFERENTIAL - Abnormal; Notable for the following:    WBC 11.9 (*)    Monocytes Relative 14 (*)    Monocytes Absolute 1.7 (*)    All other components within normal limits  BASIC METABOLIC PANEL - Abnormal; Notable for the following:    Sodium 133 (*)    Glucose, Bld 147 (*)    GFR calc non Af Amer 67 (*)    GFR calc Af Amer 77 (*)    All other components within normal limits    Imaging Review No results found.   EKG Interpretation None      MDM   Final diagnoses:  Epistaxis    80 year old male with epistaxis after blowing nose.  Bleeding controlled at time of initial interview.  He was able to blow out a clot and used Afrin with no subsequent bleeding. Well-appearing with no other symptoms.    Around time of initial discharge, nosebleed recurred.  It was also easily controlled.  Pt educated on nosebleed control techniques.  He was also found to be tachycardic at this time.  Bloodwork checked and hg was reassuringly normal.  Pt's tachycardia improved.  He did not desire further workup for this, but agreed to follow up closely with his PCP.  Return precautions given.    Arbie Cookey, MD 04/19/14 [id number]

## 2014-04-18 NOTE — ED Notes (Signed)
Pt states that after he got up and drank some coffee he was blowing his nose and nose started bleeding.  Pt states that he takes an ASA 81mg .

## 2014-04-18 NOTE — ED Notes (Signed)
MD at bedside. 

## 2014-04-18 NOTE — Discharge Instructions (Signed)

## 2014-04-18 NOTE — ED Notes (Signed)
Pt reports nose has not bled since afrin given.

## 2014-04-21 ENCOUNTER — Ambulatory Visit (INDEPENDENT_AMBULATORY_CARE_PROVIDER_SITE_OTHER): Payer: Commercial Managed Care - HMO | Admitting: Emergency Medicine

## 2014-04-21 VITALS — BP 132/70 | HR 110 | Temp 97.3°F | Resp 18 | Ht 69.0 in | Wt 185.6 lb

## 2014-04-21 DIAGNOSIS — R040 Epistaxis: Secondary | ICD-10-CM

## 2014-04-21 NOTE — Progress Notes (Signed)
[facility] [address] [place] [address] [phone]  Date:  04/21/2014   Name:  Austin [name]   DOB:  Sep 16, 1933   MRN:  [id number]  PCP:  Tommi Rumps, MD    Chief Complaint: Epistaxis   History of Present Illness:  Austin [name] is a 81 y.o. very pleasant male patient who presents with the following:  Seen in [place] ER day before yesterday for a recurrent left nosebleed. They did not identify a bleeding site and did not treat it. He had another spontaneous bleed this morning. No anticoagulant. No improvement with over the counter medications or other home remedies.  Denies other complaint or health concern today.   Patient Active Problem List   Diagnosis Date Noted  . Anxiety and depression 10/13/2012  . Preventative health care 10/13/2012  . on chemotherapy 01/27/2012  . Parotid nodule 07/11/2011  . Squamous cell lung cancer 06/20/2011  . CHEST WALL PAIN, ANTERIOR 03/29/2010  . TREMOR, ESSENTIAL 03/14/2009  . ELEVATED BLOOD PRESSURE WITHOUT DIAGNOSIS OF HYPERTENSION 04/05/2007  . ALCOHOL DEPENDENCE 06/18/2006  . TOBACCO DEPENDENCE 06/18/2006  . CVA 06/18/2006    Past Medical History  Diagnosis Date  . Stroke     1980S  . Shortness of breath     WITH COUGH  . Blood transfusion   . Cancer 06/2011    lung  . Cancer of lung     Past Surgical History  Procedure Laterality Date  . Cholecystectomy    . Cardiovascular stress test      UNSURE OF DATE  ([facility])    History  Substance Use Topics  . Smoking status: Current Every Day Smoker -- 1.50 packs/day for 50 years    Types: Cigarettes  . Smokeless tobacco: Never Used     Comment: 10 cigs a day  . Alcohol Use: No     Comment: quit 2000    Family History  Problem Relation Age of Onset  . Anesthesia problems Neg Hx   . Hypotension Neg Hx   . Malignant hyperthermia Neg Hx   . Pseudochol deficiency Neg Hx     Allergies  Allergen Reactions  . Penicillins  Swelling    Medication list has been reviewed and updated.  Current Outpatient Prescriptions on File Prior to Visit  Medication Sig Dispense Refill  . aspirin 81 MG tablet Take 81 mg by mouth daily.    Marland Kitchen albuterol (PROAIR HFA) 108 (90 BASE) MCG/ACT inhaler Inhale 1-2 puffs into the lungs every 4 (four) hours as needed for wheezing or shortness of breath. (Patient not taking: Reported on 04/21/2014) 6.7 g 5  . feeding supplement (ENSURE COMPLETE) LIQD Take 237 mLs by mouth 2 (two) times daily between meals. (Patient not taking: Reported on 04/21/2014) 30 Bottle 0  . sodium chloride (OCEAN) 0.65 % SOLN nasal spray Place 1 spray into both nostrils at bedtime.     No current facility-administered medications on file prior to visit.    Review of Systems:  As per HPI, otherwise negative.    Physical Examination: Filed Vitals:   04/21/14 1102  BP: 132/70  Pulse: 110  Temp: 97.3 F (36.3 C)  Resp: 18   Filed Vitals:   04/21/14 1102  Height: 5\' 9"  (1.753 m)  Weight: 185 lb 9.6 oz (84.188 kg)   Body mass index is 27.4 kg/(m^2). Ideal Body Weight: Weight in (lb) to have BMI = 25: 168.9   GEN: WDWN, NAD,  Non-toxic, Alert & Oriented x 3 HEENT: Atraumatic, Normocephalic.  Ears and Nose: No external deformity.  Questionable bleeding site left septum EXTR: No clubbing/cyanosis/edema NEURO: Normal gait.  PSYCH: Normally interactive. Conversant. Not depressed or anxious appearing.  Calm demeanor.    Assessment and Plan: Epistaxis cauterization left septum   Signed,  Ellison Carwin, MD

## 2014-04-21 NOTE — Patient Instructions (Signed)

## 2014-05-04 ENCOUNTER — Encounter (HOSPITAL_COMMUNITY): Payer: Self-pay

## 2014-05-04 ENCOUNTER — Other Ambulatory Visit (HOSPITAL_BASED_OUTPATIENT_CLINIC_OR_DEPARTMENT_OTHER): Payer: Commercial Managed Care - HMO

## 2014-05-04 ENCOUNTER — Ambulatory Visit (HOSPITAL_COMMUNITY)
Admission: RE | Admit: 2014-05-04 | Discharge: 2014-05-04 | Disposition: A | Discharge status: Home / Self Care | Payer: Commercial Managed Care - HMO | Source: Ambulatory Visit | Attending: Internal Medicine | Admitting: Internal Medicine

## 2014-05-04 DIAGNOSIS — C3490 Malignant neoplasm of unspecified part of unspecified bronchus or lung: Secondary | ICD-10-CM

## 2014-05-04 DIAGNOSIS — Z23 Encounter for immunization: Secondary | ICD-10-CM | POA: Diagnosis not present

## 2014-05-04 DIAGNOSIS — F17210 Nicotine dependence, cigarettes, uncomplicated: Secondary | ICD-10-CM | POA: Insufficient documentation

## 2014-05-04 LAB — CBC WITH DIFFERENTIAL/PLATELET
BASO%: 1.1 % (ref 0.0–2.0)
BASOS ABS: 0.1 10*3/uL (ref 0.0–0.1)
EOS ABS: 0.1 10*3/uL (ref 0.0–0.5)
EOS%: 1.6 % (ref 0.0–7.0)
HCT: 35.9 % — ABNORMAL LOW (ref 38.4–49.9)
HGB: 11.9 g/dL — ABNORMAL LOW (ref 13.0–17.1)
LYMPH%: 25.2 % (ref 14.0–49.0)
MCH: 32.2 pg (ref 27.2–33.4)
MCHC: 33.3 g/dL (ref 32.0–36.0)
MCV: 96.8 fL (ref 79.3–98.0)
MONO#: 1.4 10*3/uL — ABNORMAL HIGH (ref 0.1–0.9)
MONO%: 14.2 % — ABNORMAL HIGH (ref 0.0–14.0)
NEUT%: 57.9 % (ref 39.0–75.0)
NEUTROS ABS: 5.5 10*3/uL (ref 1.5–6.5)
Platelets: 311 10*3/uL (ref 140–400)
RBC: 3.71 10*6/uL — ABNORMAL LOW (ref 4.20–5.82)
RDW: 15.9 % — ABNORMAL HIGH (ref 11.0–14.6)
WBC: 9.5 10*3/uL (ref 4.0–10.3)
lymph#: 2.4 10*3/uL (ref 0.9–3.3)

## 2014-05-04 LAB — COMPREHENSIVE METABOLIC PANEL (CC13)
ALK PHOS: 109 U/L (ref 40–150)
ALT: 20 U/L (ref 0–55)
AST: 20 U/L (ref 5–34)
Albumin: 3.2 g/dL — ABNORMAL LOW (ref 3.5–5.0)
Anion Gap: 7 mEq/L (ref 3–11)
BILIRUBIN TOTAL: 0.4 mg/dL (ref 0.20–1.20)
BUN: 13.1 mg/dL (ref 7.0–26.0)
CALCIUM: 8.6 mg/dL (ref 8.4–10.4)
CO2: 27 mEq/L (ref 22–29)
Chloride: 101 mEq/L (ref 98–109)
Creatinine: 1 mg/dL (ref 0.7–1.3)
EGFR: 71 mL/min/{1.73_m2} — ABNORMAL LOW (ref >90)
GLUCOSE: 138 mg/dL (ref 70–140)
POTASSIUM: 4.6 meq/L (ref 3.5–5.1)
SODIUM: 135 meq/L — AB (ref 136–145)
TOTAL PROTEIN: 6.6 g/dL (ref 6.4–8.3)

## 2014-05-04 MED ORDER — IOHEXOL 300 MG/ML  SOLN
100.0000 mL | Freq: Once | INTRAMUSCULAR | Status: AC | PRN
  Administered 2014-05-04: 80 mL via INTRAVENOUS

## 2014-05-11 ENCOUNTER — Ambulatory Visit (HOSPITAL_BASED_OUTPATIENT_CLINIC_OR_DEPARTMENT_OTHER): Payer: Commercial Managed Care - HMO | Admitting: Internal Medicine

## 2014-05-11 ENCOUNTER — Encounter: Payer: Self-pay | Admitting: Internal Medicine

## 2014-05-11 ENCOUNTER — Telehealth: Payer: Self-pay | Admitting: Internal Medicine

## 2014-05-11 VITALS — BP 131/61 | HR 103 | Temp 97.6°F | Resp 19 | Ht 69.0 in | Wt 183.1 lb

## 2014-05-11 DIAGNOSIS — C3490 Malignant neoplasm of unspecified part of unspecified bronchus or lung: Secondary | ICD-10-CM

## 2014-05-11 DIAGNOSIS — Z720 Tobacco use: Secondary | ICD-10-CM

## 2014-05-11 DIAGNOSIS — Z85118 Personal history of other malignant neoplasm of bronchus and lung: Secondary | ICD-10-CM

## 2014-05-11 NOTE — Patient Instructions (Signed)
Smoking Cessation, Tips for Success  If you are ready to quit smoking, congratulations! You have chosen to help yourself be healthier. Cigarettes bring nicotine, tar, carbon monoxide, and other irritants into your body. Your lungs, heart, and blood vessels will be able to work better without these poisons. There are many different ways to quit smoking. Nicotine gum, nicotine patches, a nicotine inhaler, or nicotine nasal spray can help with physical craving. Hypnosis, support groups, and medicines help break the habit of smoking.  WHAT THINGS CAN I DO TO MAKE QUITTING EASIER?   Here are some tips to help you quit for good:  · Pick a date when you will quit smoking completely. Tell all of your friends and family about your plan to quit on that date.  · Do not try to slowly cut down on the number of cigarettes you are smoking. Pick a quit date and quit smoking completely starting on that day.  · Throw away all cigarettes.    · Clean and remove all ashtrays from your home, work, and car.  · On a card, write down your reasons for quitting. Carry the card with you and read it when you get the urge to smoke.  · Cleanse your body of nicotine. Drink enough water and fluids to keep your urine clear or pale yellow. Do this after quitting to flush the nicotine from your body.  · Learn to predict your moods. Do not let a bad situation be your excuse to have a cigarette. Some situations in your life might tempt you into wanting a cigarette.  · Never have "just one" cigarette. It leads to wanting another and another. Remind yourself of your decision to quit.  · Change habits associated with smoking. If you smoked while driving or when feeling stressed, try other activities to replace smoking. Stand up when drinking your coffee. Brush your teeth after eating. Sit in a different chair when you read the paper. Avoid alcohol while trying to quit, and try to drink fewer caffeinated beverages. Alcohol and caffeine may urge you to  smoke.  · Avoid foods and drinks that can trigger a desire to smoke, such as sugary or spicy foods and alcohol.  · Ask people who smoke not to smoke around you.  · Have something planned to do right after eating or having a cup of coffee. For example, plan to take a walk or exercise.  · Try a relaxation exercise to calm you down and decrease your stress. Remember, you may be tense and nervous for the first 2 weeks after you quit, but this will pass.  · Find new activities to keep your hands busy. Play with a pen, coin, or rubber band. Doodle or draw things on paper.  · Brush your teeth right after eating. This will help cut down on the craving for the taste of tobacco after meals. You can also try mouthwash.    · Use oral substitutes in place of cigarettes. Try using lemon drops, carrots, cinnamon sticks, or chewing gum. Keep them handy so they are available when you have the urge to smoke.  · When you have the urge to smoke, try deep breathing.  · Designate your home as a nonsmoking area.  · If you are a heavy smoker, ask your health care provider about a prescription for nicotine chewing gum. It can ease your withdrawal from nicotine.  · Reward yourself. Set aside the cigarette money you save and buy yourself something nice.  · Look for   support from others. Join a support group or smoking cessation program. Ask someone at home or at work to help you with your plan to quit smoking.  · Always ask yourself, "Do I need this cigarette or is this just a reflex?" Tell yourself, "Today, I choose not to smoke," or "I do not want to smoke." You are reminding yourself of your decision to quit.  · Do not replace cigarette smoking with electronic cigarettes (commonly called e-cigarettes). The safety of e-cigarettes is unknown, and some may contain harmful chemicals.  · If you relapse, do not give up! Plan ahead and think about what you will do the next time you get the urge to smoke.  HOW WILL I FEEL WHEN I QUIT SMOKING?  You  may have symptoms of withdrawal because your body is used to nicotine (the addictive substance in cigarettes). You may crave cigarettes, be irritable, feel very hungry, cough often, get headaches, or have difficulty concentrating. The withdrawal symptoms are only temporary. They are strongest when you first quit but will go away within 10-14 days. When withdrawal symptoms occur, stay in control. Think about your reasons for quitting. Remind yourself that these are signs that your body is healing and getting used to being without cigarettes. Remember that withdrawal symptoms are easier to treat than the major diseases that smoking can cause.   Even after the withdrawal is over, expect periodic urges to smoke. However, these cravings are generally short lived and will go away whether you smoke or not. Do not smoke!  WHAT RESOURCES ARE AVAILABLE TO HELP ME QUIT SMOKING?  Your health care provider can direct you to community resources or hospitals for support, which may include:  · Group support.  · Education.  · Hypnosis.  · Therapy.  Document Released: 01/04/2004 Document Revised: 08/22/2013 Document Reviewed: 09/23/2012  ExitCare® Patient Information ©2015 [employer]. This information is not intended to replace advice given to you by your health care provider. Make sure you discuss any questions you have with your health care provider.

## 2014-05-11 NOTE — Progress Notes (Signed)
[facility] [phone]   [phone]  OFFICE PROGRESS NOTE  Tommi Rumps, MD [place] [address]  DIAGNOSIS: Metastatic non-small cell lung cancer, squamous cell carcinoma presenting with 2 nodules in the left lung as well as one nodule in the right lung diagnosed in March of 2013.   PRIOR THERAPY: systemic chemotherapy with carboplatin for AUC of 5 and paclitaxel 175 mg/M2 every 3 weeks with Neulasta support. Status post 5 cycles, last dose was given on 02/17/2012 with stable disease   CURRENT THERAPY: Observation.  INTERVAL HISTORY: Austin [name] 81 y.o. male returns to the clinic today for three-month followup visit. He is here today for routine evaluation. The patient is feeling fine today with no specific complaints. He denied having any significant weight loss or night sweats. He has no chest pain, shortness of breath, but has mild cough with no hemoptysis. He is still smoking and unwilling to quit smoking. The patient has repeat CT scan of the chest, abdomen and pelvis performed recently and he is here for evaluation and discussion of his scan results.  MEDICAL HISTORY: Past Medical History  Diagnosis Date  . Stroke     1980S  . Shortness of breath     WITH COUGH  . Blood transfusion   . Cancer 06/2011    lung  . Cancer of lung     ALLERGIES:  is allergic to penicillins.  MEDICATIONS:  Current Outpatient Prescriptions  Medication Sig Dispense Refill  . albuterol (PROAIR HFA) 108 (90 BASE) MCG/ACT inhaler Inhale 1-2 puffs into the lungs every 4 (four) hours as needed for wheezing or shortness of breath. (Patient not taking: Reported on 04/21/2014) 6.7 g 5  . aspirin 81 MG tablet Take 81 mg by mouth daily.    . feeding supplement (ENSURE COMPLETE) LIQD Take 237 mLs by mouth 2 (two) times daily between meals. (Patient not taking: Reported on 04/21/2014) 30 Bottle 0  . sodium chloride (OCEAN) 0.65 % SOLN nasal  spray Place 1 spray into both nostrils at bedtime.     No current facility-administered medications for this visit.    SURGICAL HISTORY:  Past Surgical History  Procedure Laterality Date  . Cholecystectomy    . Cardiovascular stress test      UNSURE OF DATE  ([facility])    REVIEW OF SYSTEMS:  A comprehensive review of systems was negative except for: Respiratory: positive for cough and dyspnea on exertion   PHYSICAL EXAMINATION: General appearance: alert, cooperative and no distress Head: Normocephalic, without obvious abnormality, atraumatic Neck: no adenopathy Lymph nodes: Cervical, supraclavicular, and axillary nodes normal. Resp: clear to auscultation bilaterally Cardio: regular rate and rhythm, S1, S2 normal, no murmur, click, rub or gallop GI: soft, non-tender; bowel sounds normal; no masses,  no organomegaly Extremities: extremities normal, atraumatic, no cyanosis or edema  ECOG PERFORMANCE STATUS: 1 - Symptomatic but completely ambulatory  Blood pressure 131/61, pulse 103, temperature 97.6 F (36.4 C), temperature source Oral, resp. rate 19, height 5\' 9"  (1.753 m), weight 183 lb 1.6 oz (83.054 kg), SpO2 99 %.  LABORATORY DATA: Lab Results  Component Value Date   WBC 9.5 05/04/2014   HGB 11.9* 05/04/2014   HCT 35.9* 05/04/2014   MCV 96.8 05/04/2014   PLT 311 05/04/2014      Chemistry      Component Value Date/Time   NA 135* 05/04/2014 1136   NA 133* 04/18/2014 1421  K 4.6 05/04/2014 1136   K 4.5 04/18/2014 1421   CL 103 04/18/2014 1421   CL 102 09/22/2012 0802   CO2 27 05/04/2014 1136   CO2 23 04/18/2014 1421   BUN 13.1 05/04/2014 1136   BUN 20 04/18/2014 1421   CREATININE 1.0 05/04/2014 1136   CREATININE 1.04 04/18/2014 1421      Component Value Date/Time   CALCIUM 8.6 05/04/2014 1136   CALCIUM 8.9 04/18/2014 1421   ALKPHOS 109 05/04/2014 1136   ALKPHOS 133* 01/27/2012 1337   AST 20 05/04/2014 1136   AST 21 01/27/2012 1337   ALT 20  05/04/2014 1136   ALT 29 01/27/2012 1337   BILITOT 0.40 05/04/2014 1136   BILITOT 0.8 01/27/2012 1337       RADIOGRAPHIC STUDIES: Ct Chest W Contrast  05/04/2014   CLINICAL DATA:  Restaging evaluation.  EXAM: CT CHEST WITH CONTRAST  TECHNIQUE: Multidetector CT imaging of the chest was performed during intravenous contrast administration.  CONTRAST:  8mL OMNIPAQUE IOHEXOL 300 MG/ML  SOLN  COMPARISON:  Chest CT 02/10/2014  FINDINGS: Right anterior chest wall Port-A-Cath is present with tip terminating in the superior cavoatrial junction. Multiple metallic foreign bodies are demonstrated within the right anterior chest wall. Stable 9 mm precarinal lymph node. Normal heart size. Coronary arterial vascular calcifications. No pericardial effusion. Normal caliber aorta and main pulmonary artery.  The central airways are patent. Multiple bilateral pulmonary nodules are grossly stable when compared to prior examination including a spiculated left lower lobe pulmonary nodule measuring 1.8 x 1.7 cm, previously 1.7 x 1.6 cm (image 41; series 5) ; a spiculated left lower lobe nodule measuring 1.3 x 2.4 cm, previously 1.2 x 2.2 cm (image 36; series 5); and a spiculated subpleural right lower lobe nodule measuring 1.1 x 0.8 cm, previously 1.4 x 0.9 cm (image 34; series 5). Two adjacent 3 mm subpleural right middle lobe nodules are stable. No pleural effusion or pneumothorax.  Stable morphologic changes to the liver, most compatible with cirrhosis. Unchanged 1.7 cm focal area of increased attenuation within the central aspect of liver. Granulomatous changes within the spleen. No aggressive or acute appearing osseous lesions.  IMPRESSION: Grossly stable bilateral spiculated pulmonary nodules.  No significant interval change probable focal area of increased enhancing within the central aspect of the liver, nonspecific in etiology however potentially representing hypervascular lesion. Attention on followup is recommended.    Electronically Signed   By: Lovey Newcomer M.D.   On: 05/04/2014 13:45    ASSESSMENT AND PLAN: This is a very pleasant 81 years old white male with metastatic non-small cell lung cancer, squamous cell carcinoma status post 5 cycles of systemic chemotherapy with carboplatin and paclitaxel and has been observation since October of 2013. The recent CT scan showed no evidence for disease progression.  I discussed the scan results with the patient today.  I recommended for him to continue on observation with repeat CT scan of the chest in 4 months for reevaluation of his disease For smoke cessation, I strongly encouraged the patient to quit smoking and offered him to smoke cessation program but unfortunately he is not willing to quit.  He was advised to call immediately if he has any concerning symptoms in the interval. The patient voices understanding of current disease status and treatment options and is in agreement with the current care plan.  All questions were answered. The patient knows to call the clinic with any problems, questions or concerns. We can certainly see  the patient much sooner if necessary.  Disclaimer: This note was dictated with voice recognition software. Similar sounding words can inadvertently be transcribed and may not be corrected upon review.

## 2014-05-11 NOTE — Telephone Encounter (Signed)
Gave avs & calendar for May. °

## 2014-08-14 ENCOUNTER — Encounter: Payer: Self-pay | Admitting: Family Medicine

## 2014-08-14 ENCOUNTER — Ambulatory Visit (INDEPENDENT_AMBULATORY_CARE_PROVIDER_SITE_OTHER): Payer: Commercial Managed Care - HMO | Admitting: Family Medicine

## 2014-08-14 VITALS — BP 138/59 | HR 91 | Temp 97.7°F | Ht 69.0 in | Wt 182.0 lb

## 2014-08-14 DIAGNOSIS — H9192 Unspecified hearing loss, left ear: Secondary | ICD-10-CM

## 2014-08-14 DIAGNOSIS — R1013 Epigastric pain: Secondary | ICD-10-CM | POA: Diagnosis not present

## 2014-08-14 DIAGNOSIS — Z13220 Encounter for screening for lipoid disorders: Secondary | ICD-10-CM

## 2014-08-14 DIAGNOSIS — Z8673 Personal history of transient ischemic attack (TIA), and cerebral infarction without residual deficits: Secondary | ICD-10-CM | POA: Diagnosis not present

## 2014-08-14 DIAGNOSIS — M5489 Other dorsalgia: Secondary | ICD-10-CM

## 2014-08-14 LAB — LIPID PANEL
CHOL/HDL RATIO: 5 ratio
Cholesterol: 126 mg/dL (ref 0–200)
HDL: 25 mg/dL — ABNORMAL LOW (ref >=40)
LDL CALC: 70 mg/dL (ref 0–99)
Triglycerides: 156 mg/dL — ABNORMAL HIGH (ref <150)
VLDL: 31 mg/dL (ref 0–40)

## 2014-08-14 LAB — CBC WITH DIFFERENTIAL/PLATELET
BASOS ABS: 0 10*3/uL (ref 0.0–0.1)
Basophils Relative: 0 % (ref 0–1)
EOS ABS: 0.1 10*3/uL (ref 0.0–0.7)
Eosinophils Relative: 1 % (ref 0–5)
HCT: 43 % (ref 39.0–52.0)
Hemoglobin: 14.7 g/dL (ref 13.0–17.0)
LYMPHS ABS: 2.4 10*3/uL (ref 0.7–4.0)
LYMPHS PCT: 26 % (ref 12–46)
MCH: 31.7 pg (ref 26.0–34.0)
MCHC: 34.2 g/dL (ref 30.0–36.0)
MCV: 92.7 fL (ref 78.0–100.0)
MPV: 10 fL (ref 8.6–12.4)
Monocytes Absolute: 1.2 10*3/uL — ABNORMAL HIGH (ref 0.1–1.0)
Monocytes Relative: 13 % — ABNORMAL HIGH (ref 3–12)
NEUTROS PCT: 60 % (ref 43–77)
Neutro Abs: 5.5 10*3/uL (ref 1.7–7.7)
Platelets: 241 10*3/uL (ref 150–400)
RBC: 4.64 MIL/uL (ref 4.22–5.81)
RDW: 15.9 % — ABNORMAL HIGH (ref 11.5–15.5)
WBC: 9.2 10*3/uL (ref 4.0–10.5)

## 2014-08-14 LAB — COMPREHENSIVE METABOLIC PANEL
ALT: 19 U/L (ref 0–53)
AST: 17 U/L (ref 0–37)
Albumin: 3.5 g/dL (ref 3.5–5.2)
Alkaline Phosphatase: 100 U/L (ref 39–117)
BILIRUBIN TOTAL: 0.5 mg/dL (ref 0.2–1.2)
BUN: 16 mg/dL (ref 6–23)
CO2: 26 meq/L (ref 19–32)
Calcium: 8.7 mg/dL (ref 8.4–10.5)
Chloride: 98 mEq/L (ref 96–112)
Creat: 1.05 mg/dL (ref 0.50–1.35)
Glucose, Bld: 248 mg/dL — ABNORMAL HIGH (ref 70–99)
Potassium: 4.3 mEq/L (ref 3.5–5.3)
SODIUM: 132 meq/L — AB (ref 135–145)
TOTAL PROTEIN: 6.4 g/dL (ref 6.0–8.3)

## 2014-08-14 NOTE — Patient Instructions (Signed)
Nice to see you. We are going to get a scan of your abdomen and have you see a GI doctor for colonoscopy and possible endoscopy.  We will check lab work as well. If you develop worsening abdominal discomfort, vomiting, diarrhea, weakness, numbness, fever, or loss of bladder or bowel function please seek medical attention.Marland Kitchen

## 2014-08-15 ENCOUNTER — Telehealth: Payer: Self-pay | Admitting: Family Medicine

## 2014-08-15 NOTE — Telephone Encounter (Signed)
Called and spoke to patient regarding elevated blood glucose on labs. Discussed that this could indicate that he has diabetes. I would like for him to come in for an A1c later this week and made a lab appointment for Friday to have this checked. Note his sodium corrects to 136 given his blood sugar. Will forward to Horris Latino so that she knows the check will be for an A1c since I can not place POC orders for the future.

## 2014-08-16 DIAGNOSIS — R1013 Epigastric pain: Secondary | ICD-10-CM | POA: Insufficient documentation

## 2014-08-16 DIAGNOSIS — H9190 Unspecified hearing loss, unspecified ear: Secondary | ICD-10-CM | POA: Insufficient documentation

## 2014-08-16 DIAGNOSIS — M549 Dorsalgia, unspecified: Secondary | ICD-10-CM | POA: Insufficient documentation

## 2014-08-16 MED ORDER — OMEPRAZOLE 20 MG PO CPDR: 20.0000 mg | DELAYED_RELEASE_CAPSULE | Freq: Every day | ORAL | Status: DC

## 2014-08-16 NOTE — Assessment & Plan Note (Addendum)
Patient with onset of epigastric abdominal pain. Concerning is the finding of a palpable firmness in this area. He reports symptoms of reflux and this could be related to GERD, gastritis, or an ulcer, though the firmness is concerning for a more sinister process occurring. Will check CMET and CBC. CT scan abd/pelvis to evaluate further. Refer to GI for colonoscopy and EGD. Start on omeprazole. Given return precautions.   Precepted this problem with Dr Mingo Amber.

## 2014-08-16 NOTE — Assessment & Plan Note (Addendum)
Right mid back pain. Likely MSK in origin, though with his history of lung cancer could be related to this with his coarse breath sounds on exam. O2 sat in normal range and vitals stable with no increase in dyspnea so doubt PE especially with lack of chest pain. Neurologically intact in LE. Discussed obtaining CXR to evaluate lungs and ribs, though patient stated he preferred to wait until he saw his oncologist to do this early in May. Will check CMET to see if liver could be contributing to this as this is what the patient is most worried about. Will continue to monitor at this time. Discussed return precautions.

## 2014-08-16 NOTE — Assessment & Plan Note (Signed)
Mild decreased hearing on testing. Offered referral to audiology, though patient declined this and preferred to monitor this at this time.

## 2014-08-16 NOTE — Progress Notes (Signed)
Patient ID: UNIQUE SEARFOSS, male   DOB: July 03, 1933, 81 y.o.   MRN: [id number]  Tommi Rumps, MD Phone: [phone]  Austin [name] is a 81 y.o. male who presents today for f/u.  Wants ears checked. States had an infection last year and has had decreased hearing on left side since that time. Feels like they may be full of wax. No tinnitus.  Back pain: right mid back. For 2 weeks. Hurts with deep breath. Mild discomfort. No injury. Has not improved or worsened. No weakness, numbness, fever, saddle anesthesia, or incontinence. Notes cough chronically related to his lung cancer. No hemoptysis. No CP. No history of blood clot. No LE swelling. Patient is worried that this could be his liver.  Abdominal pain: epigastric and RUQ. Had gall bladder removed 7-8 years ago. Is a burning pain. Some nausea. No vomiting or diarrhea. Has reflux and sour taste in mouth. Lost 8 lbs in last year. States he can only eat light meals due to this discomfort. Has not had a recent colonoscopy. Did previously abuse alcohol and was told that he would develop cirrhosis if he did not stop drinking, so he stopped a number of years ago. No blood in stool.  PMH: smoker.   ROS: Per HPI   Physical Exam Filed Vitals:   08/14/14 1003  BP: 138/59  Pulse: 91  Temp: 97.7 F (36.5 C)    Gen: Well NAD HEENT: PERRL,  MMM, bilateral TMs normal, no OP erythema, no cervical LAD Lungs: bilateral coarse breath sounds with mild wheezes Nl WOB Heart: RRR, no murmur appreciated Abd: soft, mild TTP epigastric region, there is a firmness palpated in the epigastric region that is not palpable, no RUQ TTP, ND MSK: minimal tenderness to palpation in right mid back over right posterior ribs, no midline tenderness, no swelling noted Neuro: 5/5 strength in bilateral quads, hamstrings, plantar and dorsiflexion, sensation to light touch intact in bilateral LE, normal gait, 2+ patellar reflexes Exts: Non edematous BL  LE, warm and well  perfused.    Assessment/Plan: Please see individual problem list.  Tommi Rumps, MD [facility] PGY-3

## 2014-08-18 ENCOUNTER — Other Ambulatory Visit (INDEPENDENT_AMBULATORY_CARE_PROVIDER_SITE_OTHER): Payer: Commercial Managed Care - HMO

## 2014-08-18 ENCOUNTER — Ambulatory Visit (HOSPITAL_COMMUNITY)
Admission: RE | Admit: 2014-08-18 | Discharge: 2014-08-18 | Disposition: A | Discharge status: Home / Self Care | Payer: Commercial Managed Care - HMO | Source: Ambulatory Visit | Attending: Family Medicine | Admitting: Family Medicine

## 2014-08-18 ENCOUNTER — Telehealth: Payer: Self-pay | Admitting: *Deleted

## 2014-08-18 ENCOUNTER — Encounter (HOSPITAL_COMMUNITY): Payer: Self-pay

## 2014-08-18 DIAGNOSIS — R7309 Other abnormal glucose: Secondary | ICD-10-CM | POA: Diagnosis not present

## 2014-08-18 DIAGNOSIS — R160 Hepatomegaly, not elsewhere classified: Secondary | ICD-10-CM

## 2014-08-18 DIAGNOSIS — R1013 Epigastric pain: Secondary | ICD-10-CM | POA: Diagnosis present

## 2014-08-18 LAB — POCT GLYCOSYLATED HEMOGLOBIN (HGB A1C): Hemoglobin A1C: 6.7

## 2014-08-18 MED ORDER — IOHEXOL 300 MG/ML  SOLN
80.0000 mL | Freq: Once | INTRAMUSCULAR | Status: AC | PRN
  Administered 2014-08-18: 80 mL via INTRAVENOUS

## 2014-08-18 NOTE — Telephone Encounter (Signed)
Called and spoke to patient regarding results of CT scan. Advised that there is a mass in his liver and possibly a mass in his pancreas. Advised that we do not fully know what these are at this time and need to get the patient to a surgeon to get their input on this to elucidate a cause of these masses. Patient stated he knew what this was about as he has an oncologist. He notes he is willing to start the process of determining a cause of these lesions. Will refer to gen surgery. Advised of ED precautions. Patient voiced understanding.

## 2014-08-18 NOTE — Telephone Encounter (Signed)
Referral faxed to [facility] [phone]. Jazmin Hartsell,CMA

## 2014-08-18 NOTE — Telephone Encounter (Signed)
Opal Sidles from [facility] called report for CT of Abd/Pelvic completed today.  Report in Epic and faxing report to PCP.  Findings consistent with hepatic cirrhosis. New enhancing 2.5 x 1.9 cm abnormality is noted in superior portion of right hepatic lobe concerning for hepatocellular carcinoma or metastasis. Clinical correlation is recommended for further evaluation. If patient does not have clinical or laboratory findings suggesting pancreatitis, MRI would be recommended to evaluate for possible malignancy.  Will forward to PCP.  Derl Barrow, RN

## 2014-08-21 ENCOUNTER — Telehealth: Payer: Self-pay | Admitting: Family Medicine

## 2014-08-21 NOTE — Telephone Encounter (Signed)
"  Dr called pt Friday pm." "He told me to call if something came up and something came up"

## 2014-08-22 ENCOUNTER — Telehealth: Payer: Self-pay | Admitting: Internal Medicine

## 2014-08-22 ENCOUNTER — Other Ambulatory Visit: Payer: Self-pay | Admitting: Internal Medicine

## 2014-08-22 DIAGNOSIS — C3490 Malignant neoplasm of unspecified part of unspecified bronchus or lung: Secondary | ICD-10-CM

## 2014-08-22 NOTE — Telephone Encounter (Signed)
per pof to sch pt appt. Sent Mohamed an email to adv no openings for him or Adrena-awaiting response

## 2014-08-23 ENCOUNTER — Telehealth: Payer: Self-pay | Admitting: Internal Medicine

## 2014-08-23 NOTE — Telephone Encounter (Signed)
per pof to sch pt appt-per reply from Dr Julien Nordmann ok to sch in Provider ony slot-cld & spoke to pt and gavwe tp time & date

## 2014-08-30 ENCOUNTER — Other Ambulatory Visit (HOSPITAL_BASED_OUTPATIENT_CLINIC_OR_DEPARTMENT_OTHER): Payer: Commercial Managed Care - HMO

## 2014-08-30 ENCOUNTER — Encounter: Payer: Self-pay | Admitting: Internal Medicine

## 2014-08-30 ENCOUNTER — Telehealth: Payer: Self-pay | Admitting: Internal Medicine

## 2014-08-30 ENCOUNTER — Ambulatory Visit (HOSPITAL_BASED_OUTPATIENT_CLINIC_OR_DEPARTMENT_OTHER): Payer: Commercial Managed Care - HMO | Admitting: Internal Medicine

## 2014-08-30 VITALS — BP 131/67 | HR 96 | Temp 97.8°F | Resp 18 | Ht 69.0 in | Wt 182.4 lb

## 2014-08-30 DIAGNOSIS — C3432 Malignant neoplasm of lower lobe, left bronchus or lung: Secondary | ICD-10-CM

## 2014-08-30 DIAGNOSIS — C3490 Malignant neoplasm of unspecified part of unspecified bronchus or lung: Secondary | ICD-10-CM

## 2014-08-30 DIAGNOSIS — Z720 Tobacco use: Secondary | ICD-10-CM | POA: Diagnosis not present

## 2014-08-30 LAB — CBC WITH DIFFERENTIAL/PLATELET
BASO%: 0.8 % (ref 0.0–2.0)
BASOS ABS: 0.1 10*3/uL (ref 0.0–0.1)
EOS%: 1.1 % (ref 0.0–7.0)
Eosinophils Absolute: 0.1 10*3/uL (ref 0.0–0.5)
HCT: 43.6 % (ref 38.4–49.9)
HEMOGLOBIN: 15 g/dL (ref 13.0–17.1)
LYMPH%: 23.2 % (ref 14.0–49.0)
MCH: 32 pg (ref 27.2–33.4)
MCHC: 34.4 g/dL (ref 32.0–36.0)
MCV: 92.9 fL (ref 79.3–98.0)
MONO#: 1.2 10*3/uL — AB (ref 0.1–0.9)
MONO%: 11.5 % (ref 0.0–14.0)
NEUT#: 6.4 10*3/uL (ref 1.5–6.5)
NEUT%: 63.4 % (ref 39.0–75.0)
Platelets: 232 10*3/uL (ref 140–400)
RBC: 4.69 10*6/uL (ref 4.20–5.82)
RDW: 16.2 % — AB (ref 11.0–14.6)
WBC: 10.2 10*3/uL (ref 4.0–10.3)
lymph#: 2.4 10*3/uL (ref 0.9–3.3)

## 2014-08-30 LAB — COMPREHENSIVE METABOLIC PANEL (CC13)
ALBUMIN: 3.3 g/dL — AB (ref 3.5–5.0)
ALT: 22 U/L (ref 0–55)
ANION GAP: 5 meq/L (ref 3–11)
AST: 19 U/L (ref 5–34)
Alkaline Phosphatase: 124 U/L (ref 40–150)
BUN: 14 mg/dL (ref 7.0–26.0)
CO2: 27 meq/L (ref 22–29)
Calcium: 8.8 mg/dL (ref 8.4–10.4)
Chloride: 100 mEq/L (ref 98–109)
Creatinine: 1.2 mg/dL (ref 0.7–1.3)
EGFR: 59 mL/min/{1.73_m2} — AB (ref >90)
GLUCOSE: 255 mg/dL — AB (ref 70–140)
POTASSIUM: 4.5 meq/L (ref 3.5–5.1)
Sodium: 132 mEq/L — ABNORMAL LOW (ref 136–145)
Total Bilirubin: 0.45 mg/dL (ref 0.20–1.20)
Total Protein: 6.5 g/dL (ref 6.4–8.3)

## 2014-08-30 NOTE — Telephone Encounter (Signed)
Pt confirmed labs/ov per 05/11 POF, gave pt AVS and Calendar.... KJ, lft msg for IR to schedule pt for US guided core biopsy of live lesion.

## 2014-08-30 NOTE — Progress Notes (Signed)
[facility] [phone]   [phone]  OFFICE PROGRESS NOTE  Tommi Rumps, MD [place] [address]  DIAGNOSIS: Metastatic non-small cell lung cancer, squamous cell carcinoma presenting with 2 nodules in the left lung as well as one nodule in the right lung diagnosed in March of 2013.   PRIOR THERAPY: systemic chemotherapy with carboplatin for AUC of 5 and paclitaxel 175 mg/M2 every 3 weeks with Neulasta support. Status post 5 cycles, last dose was given on 02/17/2012 with stable disease   CURRENT THERAPY: Observation.  INTERVAL HISTORY: Austin [name] 81 y.o. male returns to the clinic today for three-month followup visit. He is here today for routine evaluation. The patient is feeling fine today with no specific complaints except for occasional right upper quadrant abdominal pain. He was seen recently by his primary care physician complaining of the right upper quadrant abdominal pain and CT scan of the abdomen and pelvis was performed on 08/18/2014 and it showed findings consistent with hepatic cirrhosis. There was new enhancing 2.5 x 1.9 cm abnormality in the superior portion of the right hepatic lobe concerning for hepatocellular carcinoma or metastasis. There was also interval development of pancreatic ductal dilatation presumably due to mildly prominent pancreatic head with possible surrounding inflammatory changes concerning for pancreatitis. The patient has a history of alcohol abuse but quit more than 10 years ago. He denied having any significant weight loss or night sweats. He has no chest pain, shortness of breath, but has mild cough with no hemoptysis. He is still smoking and unwilling to quit smoking. He is here today for evaluation and recommendation regarding this abnormality in his liver.  MEDICAL HISTORY: Past Medical History  Diagnosis Date  . Stroke     1980S  . Shortness of breath     WITH COUGH  . Blood  transfusion   . Cancer 06/2011    lung  . Cancer of lung     ALLERGIES:  is allergic to penicillins.  MEDICATIONS:  Current Outpatient Prescriptions  Medication Sig Dispense Refill  . albuterol (PROAIR HFA) 108 (90 BASE) MCG/ACT inhaler Inhale 1-2 puffs into the lungs every 4 (four) hours as needed for wheezing or shortness of breath. 6.7 g 5  . aspirin 81 MG tablet Take 81 mg by mouth daily.    . feeding supplement (ENSURE COMPLETE) LIQD Take 237 mLs by mouth 2 (two) times daily between meals. 30 Bottle 0  . omeprazole (PRILOSEC) 20 MG capsule Take 1 capsule (20 mg total) by mouth daily. 30 capsule 2  . sodium chloride (OCEAN) 0.65 % SOLN nasal spray Place 1 spray into both nostrils at bedtime.     No current facility-administered medications for this visit.    SURGICAL HISTORY:  Past Surgical History  Procedure Laterality Date  . Cholecystectomy    . Cardiovascular stress test      UNSURE OF DATE  ([facility])    REVIEW OF SYSTEMS:  A comprehensive review of systems was negative except for: Respiratory: positive for cough and dyspnea on exertion Gastrointestinal: positive for abdominal pain   PHYSICAL EXAMINATION: General appearance: alert, cooperative and no distress Head: Normocephalic, without obvious abnormality, atraumatic Neck: no adenopathy Lymph nodes: Cervical, supraclavicular, and axillary nodes normal. Resp: clear to auscultation bilaterally Cardio: regular rate and rhythm, S1, S2 normal, no murmur, click, rub or gallop GI: soft, non-tender; bowel sounds normal; no masses,  no organomegaly Extremities: extremities normal, atraumatic,  no cyanosis or edema  ECOG PERFORMANCE STATUS: 1 - Symptomatic but completely ambulatory  Blood pressure 131/67, pulse 96, temperature 97.8 F (36.6 C), temperature source Oral, resp. rate 18, height '5\' 9"'$  (1.753 m), weight 182 lb 6.4 oz (82.736 kg), SpO2 100 %.  LABORATORY DATA: Lab Results  Component Value Date   WBC  10.2 08/30/2014   HGB 15.0 08/30/2014   HCT 43.6 08/30/2014   MCV 92.9 08/30/2014   PLT 232 08/30/2014      Chemistry      Component Value Date/Time   NA 132* 08/30/2014 0924   NA 132* 08/14/2014 1055   K 4.5 08/30/2014 0924   K 4.3 08/14/2014 1055   CL 98 08/14/2014 1055   CL 102 09/22/2012 0802   CO2 27 08/30/2014 0924   CO2 26 08/14/2014 1055   BUN 14.0 08/30/2014 0924   BUN 16 08/14/2014 1055   CREATININE 1.2 08/30/2014 0924   CREATININE 1.05 08/14/2014 1055   CREATININE 1.04 04/18/2014 1421      Component Value Date/Time   CALCIUM 8.8 08/30/2014 0924   CALCIUM 8.7 08/14/2014 1055   ALKPHOS 124 08/30/2014 0924   ALKPHOS 100 08/14/2014 1055   AST 19 08/30/2014 0924   AST 17 08/14/2014 1055   ALT 22 08/30/2014 0924   ALT 19 08/14/2014 1055   BILITOT 0.45 08/30/2014 0924   BILITOT 0.5 08/14/2014 1055       RADIOGRAPHIC STUDIES: Ct Abdomen Pelvis W Contrast  08/18/2014   CLINICAL DATA:  Epigastric abdominal pain.  EXAM: CT ABDOMEN AND PELVIS WITH CONTRAST  TECHNIQUE: Multidetector CT imaging of the abdomen and pelvis was performed using the standard protocol following bolus administration of intravenous contrast.  CONTRAST:  16m OMNIPAQUE IOHEXOL 300 MG/ML  SOLN  COMPARISON:  CT scan of November 03, 2013.  FINDINGS: Degenerative disc disease is noted at L4-5 and L5-S1. No significant abnormality is noted in the visualized lung bases.  Status post cholecystectomy. Nodular hepatic margins are noted consistent with hepatic cirrhosis. New enhancing abnormality measuring 25 x 19 mm is noted in superior portion of right hepatic lobe concerning for hepatocellular carcinoma. Calcified granulomata are again noted in the spleen. The spleen is normal in size. Pancreatic ductal dilatation is noted, measuring 6 mm in the pancreatic body. Prominence of pancreatic head is noted measuring 2.6 cm with possible minimal surrounding inflammation ; this may represent acute pancreatitis, but neoplasm  or malignancy cannot be excluded. Adrenal glands appear normal. Small left renal cysts are noted. No hydronephrosis or renal obstruction is noted.  Atherosclerosis of abdominal aorta is noted without aneurysm formation. The appendix appears normal. There is no evidence of bowel obstruction. No abnormal fluid collection is noted. Urinary bladder appears normal. Mild prostatic enlargement is noted. No significant adenopathy is noted.  IMPRESSION: Findings consistent with hepatic cirrhosis. New enhancing 2.5 x 1.9 cm abnormality is noted in superior portion of right hepatic lobe concerning for hepatocellular carcinoma or metastasis.  Interval development of pancreatic ductal dilatation is noted, presumably due to mildly prominent pancreatic head with possible surrounding inflammatory changes concerning for pancreatitis ; alternatively, this may represent possible mass or neoplasm. Clinical correlation is recommended for further evaluation. If patient does not have clinical or laboratory findings suggesting pancreatitis, MRI would be recommended to evaluate for possible malignancy.  These results will be called to the ordering clinician or representative by the Radiologist Assistant, and communication documented in the PACS or zVision Dashboard.   Electronically Signed  By: Marijo Conception, M.D.   On: 08/18/2014 10:28    ASSESSMENT AND PLAN: This is a very pleasant 81 years old white male with metastatic non-small cell lung cancer, squamous cell carcinoma status post 5 cycles of systemic chemotherapy with carboplatin and paclitaxel and has been observation since October of 2013. The recent CT scan of the abdomen showed questionable lesion in the liver concerning for metastatic disease versus hepatocellular carcinoma. I discussed the scan results with the patient today. I recommended for him to proceed with ultrasound-guided core biopsy of the suspicious liver lesion by interventional radiology. The patient is  also scheduled for repeat CT scan of the chest and is in 2 weeks. For smoke cessation, I strongly encouraged the patient to quit smoking and offered him to smoke cessation program but unfortunately he is not willing to quit.  I will see him back for follow-up visit on 09/14/2014 for evaluation and discussion of his biopsy and the scan results as well as treatment options if needed. He was advised to call immediately if he has any concerning symptoms in the interval. The patient voices understanding of current disease status and treatment options and is in agreement with the current care plan.  All questions were answered. The patient knows to call the clinic with any problems, questions or concerns. We can certainly see the patient much sooner if necessary.  Disclaimer: This note was dictated with voice recognition software. Similar sounding words can inadvertently be transcribed and may not be corrected upon review.

## 2014-09-07 ENCOUNTER — Ambulatory Visit (HOSPITAL_COMMUNITY)
Admission: RE | Admit: 2014-09-07 | Discharge: 2014-09-07 | Disposition: A | Discharge status: Home / Self Care | Payer: Commercial Managed Care - HMO | Source: Ambulatory Visit | Attending: Internal Medicine | Admitting: Internal Medicine

## 2014-09-07 ENCOUNTER — Other Ambulatory Visit (HOSPITAL_BASED_OUTPATIENT_CLINIC_OR_DEPARTMENT_OTHER): Payer: Commercial Managed Care - HMO

## 2014-09-07 ENCOUNTER — Encounter: Payer: Self-pay | Admitting: Family Medicine

## 2014-09-07 ENCOUNTER — Telehealth: Payer: Self-pay | Admitting: Family Medicine

## 2014-09-07 DIAGNOSIS — E119 Type 2 diabetes mellitus without complications: Secondary | ICD-10-CM

## 2014-09-07 DIAGNOSIS — C3490 Malignant neoplasm of unspecified part of unspecified bronchus or lung: Secondary | ICD-10-CM | POA: Diagnosis present

## 2014-09-07 DIAGNOSIS — C3432 Malignant neoplasm of lower lobe, left bronchus or lung: Secondary | ICD-10-CM

## 2014-09-07 HISTORY — DX: Type 2 diabetes mellitus without complications: E11.9

## 2014-09-07 LAB — CBC WITH DIFFERENTIAL/PLATELET
BASO%: 0.7 % (ref 0.0–2.0)
BASOS ABS: 0.1 10*3/uL (ref 0.0–0.1)
EOS%: 1.4 % (ref 0.0–7.0)
Eosinophils Absolute: 0.1 10*3/uL (ref 0.0–0.5)
HCT: 43.8 % (ref 38.4–49.9)
HGB: 15.3 g/dL (ref 13.0–17.1)
LYMPH%: 23.6 % (ref 14.0–49.0)
MCH: 31.5 pg (ref 27.2–33.4)
MCHC: 34.9 g/dL (ref 32.0–36.0)
MCV: 90.3 fL (ref 79.3–98.0)
MONO#: 1.1 10*3/uL — AB (ref 0.1–0.9)
MONO%: 13.3 % (ref 0.0–14.0)
NEUT%: 61 % (ref 39.0–75.0)
NEUTROS ABS: 5 10*3/uL (ref 1.5–6.5)
Platelets: 208 10*3/uL (ref 140–400)
RBC: 4.85 10*6/uL (ref 4.20–5.82)
RDW: 15.3 % — ABNORMAL HIGH (ref 11.0–14.6)
WBC: 8.1 10*3/uL (ref 4.0–10.3)
lymph#: 1.9 10*3/uL (ref 0.9–3.3)

## 2014-09-07 LAB — COMPREHENSIVE METABOLIC PANEL (CC13)
ALT: 23 U/L (ref 0–55)
AST: 23 U/L (ref 5–34)
Albumin: 3.4 g/dL — ABNORMAL LOW (ref 3.5–5.0)
Alkaline Phosphatase: 134 U/L (ref 40–150)
Anion Gap: 9 mEq/L (ref 3–11)
BUN: 13.9 mg/dL (ref 7.0–26.0)
CO2: 25 meq/L (ref 22–29)
CREATININE: 1 mg/dL (ref 0.7–1.3)
Calcium: 8.8 mg/dL (ref 8.4–10.4)
Chloride: 99 mEq/L (ref 98–109)
EGFR: 69 mL/min/{1.73_m2} — AB (ref >90)
Glucose: 227 mg/dl — ABNORMAL HIGH (ref 70–140)
Potassium: 4.4 mEq/L (ref 3.5–5.1)
Sodium: 133 mEq/L — ABNORMAL LOW (ref 136–145)
Total Bilirubin: 0.55 mg/dL (ref 0.20–1.20)
Total Protein: 6.9 g/dL (ref 6.4–8.3)

## 2014-09-07 MED ORDER — IOHEXOL 300 MG/ML  SOLN
100.0000 mL | Freq: Once | INTRAMUSCULAR | Status: AC | PRN
  Administered 2014-09-07: 80 mL via INTRAVENOUS

## 2014-09-07 NOTE — Telephone Encounter (Signed)
-----   Message from Willeen Niece, MD sent at 09/06/2014  9:42 AM EDT -----   ----- Message -----    From: Maryland Pink, CMA    Sent: 08/18/2014  11:07 AM      To: Willeen Niece, MD

## 2014-09-07 NOTE — Telephone Encounter (Signed)
Called patient to discuss A1c of 6.7. Advised that he has diabetes, though given level of A1c does not need medication at this time. Will discuss dietary changes at next office visit, though advised that the most important thing at this time is to take care of the liver lesion. He is having a biopsy next week and meeting with the oncologist for followup next week as well.

## 2014-09-08 ENCOUNTER — Other Ambulatory Visit: Payer: Self-pay | Admitting: Radiology

## 2014-09-11 ENCOUNTER — Ambulatory Visit (HOSPITAL_COMMUNITY)
Admission: RE | Admit: 2014-09-11 | Discharge: 2014-09-11 | Disposition: A | Discharge status: Home / Self Care | Payer: Commercial Managed Care - HMO | Source: Ambulatory Visit | Attending: Internal Medicine | Admitting: Internal Medicine

## 2014-09-11 ENCOUNTER — Other Ambulatory Visit: Payer: Self-pay | Admitting: Internal Medicine

## 2014-09-11 ENCOUNTER — Encounter (HOSPITAL_COMMUNITY): Payer: Self-pay

## 2014-09-11 DIAGNOSIS — C3490 Malignant neoplasm of unspecified part of unspecified bronchus or lung: Secondary | ICD-10-CM

## 2014-09-11 DIAGNOSIS — C220 Liver cell carcinoma: Secondary | ICD-10-CM | POA: Insufficient documentation

## 2014-09-11 DIAGNOSIS — K7689 Other specified diseases of liver: Secondary | ICD-10-CM | POA: Diagnosis present

## 2014-09-11 LAB — CBC WITH DIFFERENTIAL/PLATELET
Basophils Absolute: 0.1 10*3/uL (ref 0.0–0.1)
Basophils Relative: 1 % (ref 0–1)
EOS ABS: 0.1 10*3/uL (ref 0.0–0.7)
Eosinophils Relative: 1 % (ref 0–5)
HCT: 42.8 % (ref 39.0–52.0)
HEMOGLOBIN: 15.2 g/dL (ref 13.0–17.0)
Lymphocytes Relative: 27 % (ref 12–46)
Lymphs Abs: 2.8 10*3/uL (ref 0.7–4.0)
MCH: 32.3 pg (ref 26.0–34.0)
MCHC: 35.5 g/dL (ref 30.0–36.0)
MCV: 90.9 fL (ref 78.0–100.0)
MONO ABS: 1.2 10*3/uL — AB (ref 0.1–1.0)
Monocytes Relative: 11 % (ref 3–12)
NEUTROS PCT: 60 % (ref 43–77)
Neutro Abs: 6.1 10*3/uL (ref 1.7–7.7)
Platelets: 221 10*3/uL (ref 150–400)
RBC: 4.71 MIL/uL (ref 4.22–5.81)
RDW: 15.1 % (ref 11.5–15.5)
WBC: 10.2 10*3/uL (ref 4.0–10.5)

## 2014-09-11 LAB — COMPREHENSIVE METABOLIC PANEL
ALBUMIN: 3.7 g/dL (ref 3.5–5.0)
ALK PHOS: 116 U/L (ref 38–126)
ALT: 23 U/L (ref 17–63)
ANION GAP: 7 (ref 5–15)
AST: 27 U/L (ref 15–41)
BUN: 14 mg/dL (ref 6–20)
CO2: 26 mmol/L (ref 22–32)
CREATININE: 1 mg/dL (ref 0.61–1.24)
Calcium: 8.8 mg/dL — ABNORMAL LOW (ref 8.9–10.3)
Chloride: 98 mmol/L — ABNORMAL LOW (ref 101–111)
Glucose, Bld: 175 mg/dL — ABNORMAL HIGH (ref 65–99)
POTASSIUM: 4.2 mmol/L (ref 3.5–5.1)
SODIUM: 131 mmol/L — AB (ref 135–145)
Total Bilirubin: 0.7 mg/dL (ref 0.3–1.2)
Total Protein: 7.2 g/dL (ref 6.5–8.1)

## 2014-09-11 LAB — APTT: APTT: 34 s (ref 24–37)

## 2014-09-11 LAB — PROTIME-INR
INR: 1.01 (ref 0.00–1.49)
Prothrombin Time: 13.5 seconds (ref 11.6–15.2)

## 2014-09-11 MED ORDER — FENTANYL CITRATE (PF) 100 MCG/2ML IJ SOLN
INTRAMUSCULAR | Status: AC
  Filled 2014-09-11: qty 4

## 2014-09-11 MED ORDER — FENTANYL CITRATE (PF) 100 MCG/2ML IJ SOLN
INTRAMUSCULAR | Status: AC | PRN
  Administered 2014-09-11: 50 ug via INTRAVENOUS

## 2014-09-11 MED ORDER — HYDROCODONE-ACETAMINOPHEN 5-325 MG PO TABS
1.0000 | ORAL_TABLET | ORAL | Status: DC | PRN
  Filled 2014-09-11: qty 2

## 2014-09-11 MED ORDER — MIDAZOLAM HCL 2 MG/2ML IJ SOLN
INTRAMUSCULAR | Status: AC
  Filled 2014-09-11: qty 6

## 2014-09-11 MED ORDER — MIDAZOLAM HCL 2 MG/2ML IJ SOLN
INTRAMUSCULAR | Status: AC | PRN
  Administered 2014-09-11 (×3): 1 mg via INTRAVENOUS

## 2014-09-11 MED ORDER — SODIUM CHLORIDE 0.9 % IV SOLN: INTRAVENOUS | Status: DC
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
## 2014-09-11 NOTE — Procedures (Signed)
[place] core 18G x3 liver nodule No complication No blood loss. See complete dictation in [facility].

## 2014-09-11 NOTE — Discharge Instructions (Signed)
Liver Biopsy, Care After Refer to this sheet in the next few weeks. These instructions provide you with information on caring for yourself after your procedure. Your health care provider may also give you more specific instructions. Your treatment has been planned according to current medical practices, but problems sometimes occur. Call your health care provider if you have any problems or questions after your procedure. WHAT TO EXPECT AFTER THE PROCEDURE After your procedure, it is typical to have the following:  A small amount of discomfort in the area where the biopsy was done and in the right shoulder or shoulder blade.  A small amount of bruising around the area where the biopsy was done and on the skin over the liver.  Sleepiness and fatigue for the rest of the day. HOME CARE INSTRUCTIONS   Rest at home for 1-2 days or as directed by your health care provider.  Have a friend or family member stay with you for at least 24 hours.  Because of the medicines used during the procedure, you should not do the following things in the first 24 hours:  Drive.  Use machinery.  Be responsible for the care of other people.  Sign legal documents.  Take a bath or shower.  There are many different ways to close and cover an incision, including stitches, skin glue, and adhesive strips. Follow your health care provider's instructions on:  Incision care.  Bandage (dressing) changes and removal. May remove bandaid and shower in 24 hours. Keep area clean and dry.  Report any signs of infection.  Incision closure removal.  Do not drink alcohol in the first week.  Do not lift more than 5 pounds or play contact sports for 2 weeks after this test.  Take medicines only as directed by your health care provider. Do not take medicine containing aspirin or non-steroidal anti-inflammatory medicines such as ibuprofen for 1 week after this test.  It is your responsibility to get your test  results. SEEK MEDICAL CARE IF:   You have increased bleeding from an incision that results in more than a small spot of blood.  You have redness, swelling, or increasing pain in any incisions.  You notice a discharge or a bad smell coming from any of your incisions.  You have a fever or chills. SEEK IMMEDIATE MEDICAL CARE IF:   You develop swelling, bloating, or pain in your abdomen.  You become dizzy or faint.  You develop a rash.  You are nauseous or vomit.  You have difficulty breathing, feel short of breath, or feel faint.  You develop chest pain.  You have problems with your speech or vision.  You have trouble balancing or moving your arms or legs. Document Released: 10/25/2004 Document Revised: 08/22/2013 Document Reviewed: 06/03/2013 [facility] Patient Information 2015 [place], [place]. This information is not intended to replace advice given to you by your health care provider. Make sure you discuss any questions you have with your health care provider. Conscious Sedation Sedation is the use of medicines to promote relaxation and relieve discomfort and anxiety. Conscious sedation is a type of sedation. Under conscious sedation you are less alert than normal but are still able to respond to instructions or stimulation. Conscious sedation is used during short medical and dental procedures. It is milder than deep sedation or general anesthesia and allows you to return to your regular activities sooner.  LET [facility] CARE PROVIDER KNOW ABOUT:   Any allergies you have.  All medicines you are  taking, including vitamins, herbs, eye drops, creams, and over-the-counter medicines.  Use of steroids (by mouth or creams).  Previous problems you or members of your family have had with the use of anesthetics.  Any blood disorders you have.  Previous surgeries you have had.  Medical conditions you have.  Possibility of pregnancy, if this applies.  Use of cigarettes, alcohol,  or illegal drugs. RISKS AND COMPLICATIONS Generally, this is a safe procedure. However, as with any procedure, problems can occur. Possible problems include:  Oversedation.  Trouble breathing on your own. You may need to have a breathing tube until you are awake and breathing on your own.  Allergic reaction to any of the medicines used for the procedure. BEFORE THE PROCEDURE  You may have blood tests done. These tests can help show how well your kidneys and liver are working. They can also show how well your blood clots.  A physical exam will be done.  Only take medicines as directed by your health care provider. You may need to stop taking medicines (such as blood thinners, aspirin, or nonsteroidal anti-inflammatory drugs) before the procedure.   Do not eat or drink at least 6 hours before the procedure or as directed by your health care provider.  Arrange for a responsible adult, family member, or friend to take you home after the procedure. He or she should stay with you for at least 24 hours after the procedure, until the medicine has worn off. PROCEDURE   An intravenous (IV) catheter will be inserted into one of your veins. Medicine will be able to flow directly into your body through this catheter. You may be given medicine through this tube to help prevent pain and help you relax.  The medical or dental procedure will be done. AFTER THE PROCEDURE  You will stay in a recovery area until the medicine has worn off. Your blood pressure and pulse will be checked.   Depending on the procedure you had, you may be allowed to go home when you can tolerate liquids and your pain is under control. Document Released: 12/31/2000 Document Revised: 04/12/2013 Document Reviewed: 12/13/2012 [facility] Patient Information 2015 [place], [place]. This information is not intended to replace advice given to you by your health care provider. Make sure you discuss any questions you have with your  health care provider.

## 2014-09-13 ENCOUNTER — Encounter: Payer: Self-pay | Admitting: Family Medicine

## 2014-09-13 ENCOUNTER — Ambulatory Visit (INDEPENDENT_AMBULATORY_CARE_PROVIDER_SITE_OTHER): Payer: Commercial Managed Care - HMO | Admitting: Family Medicine

## 2014-09-13 VITALS — BP 142/78 | HR 94 | Temp 97.6°F | Ht 69.0 in | Wt 181.0 lb

## 2014-09-13 DIAGNOSIS — E119 Type 2 diabetes mellitus without complications: Secondary | ICD-10-CM

## 2014-09-13 DIAGNOSIS — M545 Low back pain, unspecified: Secondary | ICD-10-CM

## 2014-09-13 DIAGNOSIS — R1013 Epigastric pain: Secondary | ICD-10-CM

## 2014-09-13 NOTE — Patient Instructions (Addendum)
Nice to see you. I think some of your discomfort is related to reflux, though it could also be related to your liver. We will start you back on prilosec.  Please keep the follow-up for tomorrow with Dr Julien Nordmann. If you develop worsening back pain, abdominal pain, vomiting, diarrhea, weakness, loss of bowel or bladder fuinction, or fevers please seek medical attention.   Diet Recommendations for Diabetes   Starchy (carb) foods include: Bread, rice, pasta, potatoes, corn, crackers, bagels, muffins, all baked goods.   Protein foods include: Meat, fish, poultry, eggs, dairy foods, and beans such as pinto and kidney beans (beans also provide carbohydrate).   1. Eat at least 3 meals and 1-2 snacks per day. Never go more than 4-5 hours while awake without eating.  2. Limit starchy foods to TWO per meal and ONE per snack. ONE portion of a starchy  food is equal to the following:   - ONE slice of bread (or its equivalent, such as half of a hamburger bun).   - 1/2 cup of a "scoopable" starchy food such as potatoes or rice.   - 1 OUNCE (28 grams) of starchy snack foods such as crackers or pretzels (look on label).   - 15 grams of carbohydrate as shown on food label.  3. Both lunch and dinner should include a protein food, a carb food, and vegetables.   - Obtain twice as many veg's as protein or carbohydrate foods for both lunch and dinner.   - Try to keep frozen veg's on hand for a quick vegetable serving.     - Fresh or frozen veg's are best.  4. Breakfast should always include protein.

## 2014-09-14 ENCOUNTER — Other Ambulatory Visit: Payer: Self-pay | Admitting: *Deleted

## 2014-09-14 ENCOUNTER — Encounter: Payer: Self-pay | Admitting: Internal Medicine

## 2014-09-14 ENCOUNTER — Encounter: Payer: Self-pay | Admitting: *Deleted

## 2014-09-14 ENCOUNTER — Telehealth: Payer: Self-pay | Admitting: Internal Medicine

## 2014-09-14 ENCOUNTER — Ambulatory Visit (HOSPITAL_BASED_OUTPATIENT_CLINIC_OR_DEPARTMENT_OTHER): Payer: Commercial Managed Care - HMO | Admitting: Internal Medicine

## 2014-09-14 VITALS — BP 151/64 | HR 103 | Temp 97.8°F | Resp 18 | Ht 69.0 in | Wt 181.6 lb

## 2014-09-14 DIAGNOSIS — C3432 Malignant neoplasm of lower lobe, left bronchus or lung: Secondary | ICD-10-CM | POA: Diagnosis not present

## 2014-09-14 DIAGNOSIS — C220 Liver cell carcinoma: Secondary | ICD-10-CM

## 2014-09-14 DIAGNOSIS — Z720 Tobacco use: Secondary | ICD-10-CM

## 2014-09-14 DIAGNOSIS — C3490 Malignant neoplasm of unspecified part of unspecified bronchus or lung: Secondary | ICD-10-CM
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
## 2014-09-14 NOTE — Progress Notes (Signed)
Referral for GS order placed for Dr. Barry Dienes to see him for new [facility] (localized) ? Surgical candidate. Will present case in GI Tumor Board next week.

## 2014-09-14 NOTE — Assessment & Plan Note (Signed)
Continues to have issues with this. LE neurologically intact. Will obtain XR lumbar and thoracic spine to evaluate further given persistence. Could be related to liver mass as well. Will need to await biopsy results prior to considering pain medication for this.

## 2014-09-14 NOTE — Assessment & Plan Note (Signed)
New diagnosis. Advised on diet, though advised that we need to sort out his liver mass prior to him implementing dietary changes as the liver mass is the most pressing concern at this time. Will continue to monitor.

## 2014-09-14 NOTE — Assessment & Plan Note (Signed)
Continues with this pain. May be a combination of reflux and relating to the mass palpated and seen on CT scan. Advised to start on prilosec for reflux/gastritis component. Will await biopsy results and oncology conversation regarding patient mass. No peritoneal signs on exam. Given return precautions.

## 2014-09-14 NOTE — Progress Notes (Signed)
Patient ID: Austin [name], male   DOB: April 23, 1933, 81 y.o.   MRN: [id number]  Austin Rumps, MD Phone: [phone]  [name] is a 81 y.o. male who presents today for f/u.  Abdominal pain: patient notes continued abdominal pain. Had biopsy yesterday of his liver lesion. Notes discomfort is from the bottom of his stomach radiating up to the top. Is a burning sensation. Improves with belching. Has gotten better since stopping spicey food. No reflux. No sour taste. Has not been taking prilosec. Some nausea. No vomiting. No diarrhea. Has not been to see GI yet.   Back pain: right mid and low back. Radiates up to below his right scapula. Is sharp. Intermittent. Worse with movement. No weakness in LE. Notes has peripheral neuropathy, though no new numbness. No loss of bowel or bladder function. No fevers. No saddle anesthesia.   DIABETES: recent diagnosis. No changes in urination. Not on medication at this time. Notes he eats a regular diet. Yesterday had pork chop, cabbage, and corn for dinner. Sandwich with bologna, tomato, and mayo for lunch, and cereal for breakfast. Not much exercise.  PMH: smoker.   ROS: Per HPI   Physical Exam Filed Vitals:   09/13/14 0946  BP: 142/78  Pulse: 94  Temp: 97.6 F (36.4 C)    Gen: Well NAD HEENT: PERRL,  MMM Lungs: CTABL Nl WOB Heart: RRR, no murmur  Abd: soft, mild TTP epigastric region, there is a firmness palpated in the epigastric region that is not tender, no RUQ TTP, ND MSK: minimal tenderness to palpation in right mid back over right posterior ribs, no midline tenderness, no swelling noted Neuro: 5/5 strength in bilateral quads, hamstrings, plantar and dorsiflexion, sensation to light touch intact in bilateral LE, normal gait, 2+ patellar reflexes Exts: Non edematous BL  LE, warm and well perfused.    Assessment/Plan: Please see individual problem list.  Austin Rumps, MD [facility] PGY-3

## 2014-09-14 NOTE — Telephone Encounter (Signed)
Pt confirmed labs/ov per 05/26 POF, gave pt AVS and Calendar..... KJ

## 2014-09-14 NOTE — Progress Notes (Signed)
Oncology Nurse Navigator Documentation  Oncology Nurse Navigator Flowsheets 09/14/2014  Navigator Encounter Type Other  Patient Visit Type Follow-up.  Patient was dx with new cancer.  He has hx of lung cancer and now has liver cancer.  Information on liver cancer from the [facility] given and explained.  I also gave him information on smoking cessation with education.  He verbalized understanding.    Treatment Phase Other  Barriers/Navigation Needs Education  Education Understanding Cancer/ Treatment Options/smoking cessation   Interventions Other  Time Spent with Patient 15

## 2014-09-21 ENCOUNTER — Other Ambulatory Visit: Payer: Self-pay | Admitting: Internal Medicine

## 2014-09-21 ENCOUNTER — Telehealth: Payer: Self-pay | Admitting: Internal Medicine

## 2014-09-21 DIAGNOSIS — C220 Liver cell carcinoma: Secondary | ICD-10-CM

## 2014-09-21 NOTE — Telephone Encounter (Signed)
Lft msg for IR to contact pt with D/T for evaluation of liver ablation.... KJ

## 2014-09-26 ENCOUNTER — Encounter: Payer: Self-pay | Admitting: Internal Medicine

## 2014-09-26 ENCOUNTER — Ambulatory Visit: Payer: Commercial Managed Care - HMO | Admitting: Internal Medicine

## 2014-09-26 VITALS — BP 139/61 | HR 102 | Temp 98.1°F | Resp 16 | Ht 69.0 in | Wt 180.0 lb

## 2014-09-26 DIAGNOSIS — H259 Unspecified age-related cataract: Secondary | ICD-10-CM

## 2014-09-26 DIAGNOSIS — E119 Type 2 diabetes mellitus without complications: Secondary | ICD-10-CM

## 2014-09-26 DIAGNOSIS — Z23 Encounter for immunization: Secondary | ICD-10-CM

## 2014-09-26 NOTE — Progress Notes (Unsigned)
Patient ID: Austin [name], male   DOB: 1933/05/04, 81 y.o.   MRN: [id number]   Austin [name], is a 81 y.o. male  [id number]  [id number]  DOB - Jul 23, 1933  CC:  Chief Complaint  Patient presents with  . Establish Care       HPI: Austin [name] is a 81 y.o. male here today to establish medical care. He is a gentleman with a diagnosis of Metastatic small cell lung cancer daignosed in March of 2013 and s/p 5 cycles of Carboplatin and Paclitaxel with Neulasta support. His last dose was given in 02/17/2012 and he now has stable lung cancer disease. He has been discovered to have Hepatic Cirrhosis and CT findings of a new enhancing abnormality of the right Hepatic Lobe concerning for [place]. There has also been interval development of Pancreatic duct dilatation.  Pt has a history of ETOH abuse but has not had alcohol since 2000. He smokes about 10 cigarettes/day and is trying to cut down, He denies any B-symptoms.   A recent Hb A1c was found to be 6.7 placing him in the range of diabetic but he is well controlled on diet and does not want to start any medications. Additionally he reports that he has a poor appetite and eats only sporadically which actually places him at risk for hypoglycemia. He has not been evaluated by ophthalmology in more than 3 years and reports that he has a H/O cataracts that were immature for removal at the time of last evaluation.  Pt had a colonoscopy in 2007 performed by Dr. Benson Norway.  Pt has a h/o cholecystectomy and reports that subsequent to his procedure, he developed neuropathic pain in the gut which was treated with Gabapentin. However he discontinued the Gabapentin as he did not feel that it was effective. He states that he sometimes has a feeling of burning on the inside of the abdomen but denies reflux symptoms.  He has a mildly elevated cholesterol with elevated triglycerides.   We have reviewed his immunizations and he wants to have only one vaccine at a time.  He needs both Tdap and Prevnar. He also reports that his philosophy is to use a few medications as possible.   Patient has No headache, No chest pain, No abdominal pain - No Nausea, No new weakness tingling or numbness, No Cough - SOB.  Allergies  Allergen Reactions  . Penicillins Swelling   Past Medical History  Diagnosis Date  . Stroke     1980S  . Shortness of breath     WITH COUGH  . Blood transfusion   . Cancer 06/2011    lung  . Cancer of lung   . Diabetes type 2, controlled 09/07/2014   Current Outpatient Prescriptions on File Prior to Visit  Medication Sig Dispense Refill  . albuterol (PROAIR HFA) 108 (90 BASE) MCG/ACT inhaler Inhale 1-2 puffs into the lungs every 4 (four) hours as needed for wheezing or shortness of breath. 6.7 g 5  . aspirin 81 MG tablet Take 81 mg by mouth every morning.     . feeding supplement (ENSURE COMPLETE) LIQD Take 237 mLs by mouth 2 (two) times daily between meals. 30 Bottle 0  . sodium chloride (OCEAN) 0.65 % SOLN nasal spray Place 1 spray into both nostrils at bedtime as needed for congestion.      No current facility-administered medications on file prior to visit.   Family History  Problem Relation Age of Onset  . Anesthesia problems Neg Hx   .  Hypotension Neg Hx   . Malignant hyperthermia Neg Hx   . Pseudochol deficiency Neg Hx   . Cardiomyopathy Mother     Rheumatic heart Disease  . Arthritis Sister   . Diabetes Brother   . Alcohol abuse Sister    History   Social History  . Marital Status: Single    Spouse Name: N/A  . Number of Children: N/A  . Years of Education: N/A   Occupational History  . Retired     Architect   Social History Main Topics  . Smoking status: Current Every Day Smoker -- 1.50 packs/day for 50 years    Types: Cigarettes  . Smokeless tobacco: Never Used     Comment: 10 cigs a day  . Alcohol Use: No     Comment: quit 2000  . Drug Use: No  . Sexual Activity: Not Currently   Other Topics Concern   . Not on file   Social History Narrative    Review of Systems: Constitutional: Negative for fever, chills, diaphoresis, activity change, appetite change and fatigue. HENT: Negative for ear pain, nosebleeds, congestion, facial swelling, rhinorrhea, neck pain, neck stiffness and ear discharge.  Eyes: Negative for pain, discharge, redness, itching and visual disturbance. Respiratory: Negative for cough, choking, chest tightness, shortness of breath, wheezing and stridor.  Cardiovascular: Negative for chest pain, palpitations and leg swelling. Gastrointestinal: Negative for abdominal distention. Genitourinary: Negative for dysuria, urgency, frequency, hematuria, flank pain, decreased urine volume, difficulty urinating and dyspareunia.  Musculoskeletal: Negative for back pain, joint swelling, arthralgia and gait problem. Neurological: Negative for dizziness, tremors, seizures, syncope, facial asymmetry, speech difficulty, weakness, light-headedness, numbness and headaches.  Hematological: Negative for adenopathy. Does not bruise/bleed easily. Psychiatric/Behavioral: Negative for hallucinations, behavioral problems, confusion, dysphoric mood, decreased concentration and agitation.     Objective:    Filed Vitals:   09/26/14 1356  BP: 139/61  Pulse: 102  Temp: 98.1 F (36.7 C)  Resp: 16    Physical Exam: Constitutional: Patient appears well-developed and well-nourished. No distress. HENT: Normocephalic, atraumatic, External right and left ear normal. Oropharynx is clear and moist.  Eyes: Conjunctivae and EOM are normal. PERRLA, no scleral icterus. Neck: Normal ROM. Neck supple. No JVD. No tracheal deviation. No thyromegaly. CVS: RRR, S1/S2 +, no murmurs, no gallops, no carotid bruit.  Pulmonary: Effort and breath sounds normal, no stridor, rhonchi, wheezes, rales.  Abdominal: Soft. BS +, no distension, tenderness, rebound or guarding.  Musculoskeletal: Normal range of motion. No  edema and no tenderness.  Lymphadenopathy: No lymphadenopathy noted, cervical, inguinal or axillary Neuro: Alert. Normal reflexes, muscle tone coordination. No cranial nerve deficit. Skin: Skin is warm and dry. No rash noted. Not diaphoretic. No erythema. No pallor. Psychiatric: Normal mood and affect. Behavior, judgment, thought content normal.   Lab Results  Component Value Date   WBC 10.2 09/11/2014   HGB 15.2 09/11/2014   HCT 42.8 09/11/2014   MCV 90.9 09/11/2014   PLT 221 09/11/2014   Lab Results  Component Value Date   CREATININE 1.00 09/11/2014   BUN 14 09/11/2014   NA 131* 09/11/2014   K 4.2 09/11/2014   CL 98* 09/11/2014   CO2 26 09/11/2014    Lab Results  Component Value Date   HGBA1C 6.7 08/18/2014   Lipid Panel     Component Value Date/Time   CHOL 126 08/14/2014 1055   TRIG 156* 08/14/2014 1055   HDL 25* 08/14/2014 1055   CHOLHDL 5.0 08/14/2014 1055   VLDL 31  08/14/2014 1055   [place] 70 08/14/2014 1055       Assessment and plan:  1. Diabetes type 2, controlled - Pt well controlled on diet. Also his eating is sporadic and this places him at risk for hypoglycemia. Will continue to monitor blood sugars.  In light of the pancreatic changes, it is possible that this is impacting this new diagnosis of DM II. - Microalbumin, urine - Pneumococcal conjugate vaccine 13-valent  2. Senile cataracts of both eyes - Pt was seen by an ophthalmologist in the past and advised that he had immature cataracts. He has not been re-evaluated in 3 years. Pt is to call with name of Ophthalmologist so that he can be referred to the same provider at his request.  3. Immunization due - Pneumococcal conjugate vaccine 13-valent  4. Liver Mass - Pt scheduled for Ultrasound guided biopsy on 10/04/2014. He is to follow up with Dr. Julien Nordmann after biopsy.  5. Advanced Directive - Pt is noted as having a Living Will, however he has requested an Advanced Directive form and this was give  to him today during his visit.  Return in about 2 months (around 11/26/2014).  The patient was given clear instructions to go to ER or return to medical center if symptoms don't improve, worsen or new problems develop. The patient verbalized understanding. The patient was told to call to get lab results if they haven't heard anything in the next week.     This note has been created with Surveyor, quantity. Any transcriptional errors are unintentional.    [name],[name], MD [place], [place]   09/26/2014, 3:25 PM

## 2014-09-27 LAB — MICROALBUMIN, URINE: Microalb, Ur: 6.2 mg/dL — ABNORMAL HIGH (ref <2.0)

## 2014-10-04 ENCOUNTER — Other Ambulatory Visit (HOSPITAL_COMMUNITY): Payer: Self-pay | Admitting: Diagnostic Radiology

## 2014-10-04 ENCOUNTER — Ambulatory Visit
Admission: RE | Admit: 2014-10-04 | Discharge: 2014-10-04 | Disposition: A | Discharge status: Home / Self Care | Payer: Commercial Managed Care - HMO | Source: Ambulatory Visit | Attending: Internal Medicine | Admitting: Internal Medicine

## 2014-10-04 ENCOUNTER — Other Ambulatory Visit: Payer: Self-pay | Admitting: Internal Medicine

## 2014-10-04 DIAGNOSIS — C220 Liver cell carcinoma: Secondary | ICD-10-CM
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
## 2014-10-05 ENCOUNTER — Other Ambulatory Visit: Payer: Self-pay | Admitting: Diagnostic Radiology

## 2014-10-05 DIAGNOSIS — C220 Liver cell carcinoma: Secondary | ICD-10-CM

## 2014-10-11 ENCOUNTER — Other Ambulatory Visit: Payer: Self-pay | Admitting: Radiology

## 2014-10-11 NOTE — Progress Notes (Signed)
Called requested orders surgery 10-20-14 pre op 10-17-14 Thanks

## 2014-10-12 ENCOUNTER — Other Ambulatory Visit (HOSPITAL_COMMUNITY): Payer: Commercial Managed Care - HMO

## 2014-10-13 ENCOUNTER — Ambulatory Visit (HOSPITAL_COMMUNITY)
Admission: RE | Admit: 2014-10-13 | Discharge: 2014-10-13 | Disposition: A | Discharge status: Home / Self Care | Payer: Commercial Managed Care - HMO | Source: Ambulatory Visit | Attending: Diagnostic Radiology | Admitting: Diagnostic Radiology

## 2014-10-13 DIAGNOSIS — I700 Atherosclerosis of aorta: Secondary | ICD-10-CM | POA: Diagnosis not present

## 2014-10-13 DIAGNOSIS — Z9049 Acquired absence of other specified parts of digestive tract: Secondary | ICD-10-CM | POA: Insufficient documentation

## 2014-10-13 DIAGNOSIS — C220 Liver cell carcinoma: Secondary | ICD-10-CM | POA: Insufficient documentation

## 2014-10-13 DIAGNOSIS — K7460 Unspecified cirrhosis of liver: Secondary | ICD-10-CM | POA: Insufficient documentation

## 2014-10-13 DIAGNOSIS — K868 Other specified diseases of pancreas: Secondary | ICD-10-CM | POA: Diagnosis not present

## 2014-10-13 DIAGNOSIS — N289 Disorder of kidney and ureter, unspecified: Secondary | ICD-10-CM | POA: Insufficient documentation

## 2014-10-13 DIAGNOSIS — N281 Cyst of kidney, acquired: Secondary | ICD-10-CM | POA: Insufficient documentation

## 2014-10-13 MED ORDER — GADOXETATE DISODIUM 0.25 MMOL/ML IV SOLN
8.0000 mL | Freq: Once | INTRAVENOUS | Status: AC | PRN
  Administered 2014-10-13: 8 mL via INTRAVENOUS

## 2014-10-16 ENCOUNTER — Other Ambulatory Visit (HOSPITAL_COMMUNITY): Payer: Commercial Managed Care - HMO

## 2014-10-17 ENCOUNTER — Encounter (HOSPITAL_COMMUNITY)
Admission: RE | Admit: 2014-10-17 | Discharge: 2014-10-17 | Disposition: A | Discharge status: Home / Self Care | Payer: Commercial Managed Care - HMO | Source: Ambulatory Visit | Attending: Diagnostic Radiology | Admitting: Diagnostic Radiology

## 2014-10-17 ENCOUNTER — Encounter (HOSPITAL_COMMUNITY): Payer: Self-pay

## 2014-10-17 ENCOUNTER — Ambulatory Visit (HOSPITAL_COMMUNITY)
Admission: RE | Admit: 2014-10-17 | Discharge: 2014-10-17 | Disposition: A | Discharge status: Home / Self Care | Payer: Commercial Managed Care - HMO | Source: Ambulatory Visit | Attending: Anesthesiology | Admitting: Anesthesiology

## 2014-10-17 ENCOUNTER — Other Ambulatory Visit: Payer: Self-pay

## 2014-10-17 DIAGNOSIS — Z01818 Encounter for other preprocedural examination: Secondary | ICD-10-CM

## 2014-10-17 DIAGNOSIS — R0602 Shortness of breath: Secondary | ICD-10-CM | POA: Insufficient documentation

## 2014-10-17 DIAGNOSIS — R0989 Other specified symptoms and signs involving the circulatory and respiratory systems: Secondary | ICD-10-CM | POA: Diagnosis not present

## 2014-10-17 DIAGNOSIS — R05 Cough: Secondary | ICD-10-CM | POA: Diagnosis not present

## 2014-10-17 DIAGNOSIS — Z01810 Encounter for preprocedural cardiovascular examination: Secondary | ICD-10-CM | POA: Insufficient documentation

## 2014-10-17 DIAGNOSIS — Z85118 Personal history of other malignant neoplasm of bronchus and lung: Secondary | ICD-10-CM | POA: Insufficient documentation

## 2014-10-17 HISTORY — DX: Unspecified cirrhosis of liver: K74.60

## 2014-10-17 HISTORY — DX: Calculus of kidney: N20.0

## 2014-10-17 HISTORY — DX: Alcohol dependence, uncomplicated: F10.20

## 2014-10-17 HISTORY — DX: Personal history of antineoplastic chemotherapy: Z92.21

## 2014-10-17 HISTORY — DX: Tremor, unspecified: R25.1

## 2014-10-17 HISTORY — DX: Gastro-esophageal reflux disease without esophagitis: K21.9

## 2014-10-17 HISTORY — DX: Injury, unspecified, initial encounter: T14.90XA

## 2014-10-17 LAB — CBC WITH DIFFERENTIAL/PLATELET
BASOS PCT: 1 % (ref 0–1)
Basophils Absolute: 0.1 10*3/uL (ref 0.0–0.1)
Eosinophils Absolute: 0.1 10*3/uL (ref 0.0–0.7)
Eosinophils Relative: 1 % (ref 0–5)
HCT: 40.8 % (ref 39.0–52.0)
HEMOGLOBIN: 14.3 g/dL (ref 13.0–17.0)
LYMPHS ABS: 2.3 10*3/uL (ref 0.7–4.0)
LYMPHS PCT: 27 % (ref 12–46)
MCH: 32.1 pg (ref 26.0–34.0)
MCHC: 35 g/dL (ref 30.0–36.0)
MCV: 91.5 fL (ref 78.0–100.0)
MONO ABS: 1.1 10*3/uL — AB (ref 0.1–1.0)
MONOS PCT: 13 % — AB (ref 3–12)
NEUTROS PCT: 58 % (ref 43–77)
Neutro Abs: 5 10*3/uL (ref 1.7–7.7)
Platelets: 211 10*3/uL (ref 150–400)
RBC: 4.46 MIL/uL (ref 4.22–5.81)
RDW: 14.6 % (ref 11.5–15.5)
WBC: 8.6 10*3/uL (ref 4.0–10.5)

## 2014-10-17 LAB — COMPREHENSIVE METABOLIC PANEL
ALT: 29 U/L (ref 17–63)
AST: 24 U/L (ref 15–41)
Albumin: 3.7 g/dL (ref 3.5–5.0)
Alkaline Phosphatase: 123 U/L (ref 38–126)
Anion gap: 9 (ref 5–15)
BILIRUBIN TOTAL: 0.6 mg/dL (ref 0.3–1.2)
BUN: 17 mg/dL (ref 6–20)
CALCIUM: 8.6 mg/dL — AB (ref 8.9–10.3)
CHLORIDE: 95 mmol/L — AB (ref 101–111)
CO2: 24 mmol/L (ref 22–32)
Creatinine, Ser: 1.17 mg/dL (ref 0.61–1.24)
GFR calc Af Amer: >60 mL/min (ref >60)
GFR, EST NON AFRICAN AMERICAN: 57 mL/min — AB (ref >60)
Glucose, Bld: 396 mg/dL — ABNORMAL HIGH (ref 65–99)
POTASSIUM: 4.5 mmol/L (ref 3.5–5.1)
Sodium: 128 mmol/L — ABNORMAL LOW (ref 135–145)
Total Protein: 6.9 g/dL (ref 6.5–8.1)

## 2014-10-17 LAB — ABO/RH: ABO/RH(D): B POS

## 2014-10-17 LAB — PROTIME-INR
INR: 1 (ref 0.00–1.49)
Prothrombin Time: 13.4 seconds (ref 11.6–15.2)

## 2014-10-17 LAB — APTT: aPTT: 34 seconds (ref 24–37)

## 2014-10-17 NOTE — Progress Notes (Signed)
CMP results in epic per PAT visit 10/17/2014 sent to Dr Anselm Pancoast

## 2014-10-17 NOTE — Patient Instructions (Signed)
Austin [name]  10/17/2014   Your procedure is scheduled on: Friday October 20, 2014   Report to [facility] Main  Entrance take [place]  elevators to 3rd floor to  [place] at 6:00 AM.  Call this number if you have problems the morning of surgery [phone]   Remember: ONLY 1 PERSON MAY GO WITH YOU TO SHORT STAY TO GET  READY MORNING OF [place].  Do not eat food or drink liquids :After Midnight.     Take these medicines the morning of surgery with A SIP OF WATER: Albuterol Inhaler if needed; nasal Spray if needed; Eye Drops if needed                               You may not have any metal on your body including hair pins and              piercings  Do not wear jewelry,  lotions, powders or colognes, deodorant                          Men may shave face and neck.   Do not bring valuables to the hospital. [place].  Contacts, dentures or bridgework may not be worn into surgery.     Patients discharged the day of surgery will not be allowed to drive home.  Name and phone number of your driver:Brenda Weeks (sister)  Special Instructions: DO NOT REMOVE BLUE BLOOD BAND PRIOR TO SURGERY DATE           _____________________________________________________________________             [facility] - Preparing for Surgery Before surgery, you can play an important role.  Because skin is not sterile, your skin needs to be as free of germs as possible.  You can reduce the number of germs on your skin by washing with CHG (chlorahexidine gluconate) soap before surgery.  CHG is an antiseptic cleaner which kills germs and bonds with the skin to continue killing germs even after washing. Please DO NOT use if you have an allergy to CHG or antibacterial soaps.  If your skin becomes reddened/irritated stop using the CHG and inform your nurse when you arrive at Short Stay. Do not shave (including legs and underarms) for at  least 48 hours prior to the first CHG shower.  You may shave your face/neck. Please follow these instructions carefully:  1.  Shower with CHG Soap the night before surgery and the  morning of Surgery.  2.  If you choose to wash your hair, wash your hair first as usual with your  normal  shampoo.  3.  After you shampoo, rinse your hair and body thoroughly to remove the  shampoo.                           4.  Use CHG as you would any other liquid soap.  You can apply chg directly  to the skin and wash                       Gently with a scrungie or clean washcloth.  5.  Apply the CHG Soap to your body ONLY FROM THE NECK DOWN.   Do not use on face/ open                           Wound or open sores. Avoid contact with eyes, ears mouth and genitals (private parts).                       Wash face,  Genitals (private parts) with your normal soap.             6.  Wash thoroughly, paying special attention to the area where your surgery  will be performed.  7.  Thoroughly rinse your body with warm water from the neck down.  8.  DO NOT shower/wash with your normal soap after using and rinsing off  the CHG Soap.                9.  Pat yourself dry with a clean towel.            10.  Wear clean pajamas.            11.  Place clean sheets on your bed the night of your first shower and do not  sleep with pets. Day of Surgery : Do not apply any lotions/deodorants the morning of surgery.  Please wear clean clothes to the hospital/surgery center.  FAILURE TO FOLLOW THESE INSTRUCTIONS MAY RESULT IN THE CANCELLATION OF YOUR SURGERY PATIENT SIGNATURE_________________________________  NURSE SIGNATURE__________________________________  ________________________________________________________________________

## 2014-10-17 NOTE — Progress Notes (Signed)
OV note per Dr Julien Nordmann per epic 09/14/2014

## 2014-10-18 ENCOUNTER — Encounter: Payer: Self-pay | Admitting: *Deleted

## 2014-10-19 ENCOUNTER — Other Ambulatory Visit: Payer: Self-pay | Admitting: Radiology

## 2014-10-20 ENCOUNTER — Encounter (HOSPITAL_COMMUNITY)
Admission: RE | Disposition: A | Discharge status: Home / Self Care | Payer: Self-pay | Source: Ambulatory Visit | Attending: Diagnostic Radiology

## 2014-10-20 ENCOUNTER — Ambulatory Visit (HOSPITAL_COMMUNITY): Payer: Commercial Managed Care - HMO | Admitting: Anesthesiology

## 2014-10-20 ENCOUNTER — Ambulatory Visit (HOSPITAL_COMMUNITY)
Admission: RE | Admit: 2014-10-20 | Discharge: 2014-10-20 | Disposition: A | Discharge status: Home / Self Care | Payer: Commercial Managed Care - HMO | Source: Ambulatory Visit | Attending: Diagnostic Radiology | Admitting: Diagnostic Radiology

## 2014-10-20 ENCOUNTER — Encounter (HOSPITAL_COMMUNITY): Payer: Self-pay | Admitting: *Deleted

## 2014-10-20 ENCOUNTER — Encounter (HOSPITAL_COMMUNITY): Payer: Self-pay

## 2014-10-20 ENCOUNTER — Observation Stay (HOSPITAL_COMMUNITY)
Admission: RE | Admit: 2014-10-20 | Discharge: 2014-10-21 | Disposition: A | Discharge status: Home / Self Care | Payer: Commercial Managed Care - HMO | Source: Ambulatory Visit | Attending: Diagnostic Radiology | Admitting: Diagnostic Radiology

## 2014-10-20 DIAGNOSIS — C220 Liver cell carcinoma: Secondary | ICD-10-CM

## 2014-10-20 DIAGNOSIS — R05 Cough: Secondary | ICD-10-CM

## 2014-10-20 DIAGNOSIS — E1165 Type 2 diabetes mellitus with hyperglycemia: Secondary | ICD-10-CM | POA: Diagnosis not present

## 2014-10-20 DIAGNOSIS — J449 Chronic obstructive pulmonary disease, unspecified: Secondary | ICD-10-CM | POA: Diagnosis not present

## 2014-10-20 DIAGNOSIS — G8929 Other chronic pain: Secondary | ICD-10-CM | POA: Diagnosis present

## 2014-10-20 DIAGNOSIS — Z79899 Other long term (current) drug therapy: Secondary | ICD-10-CM | POA: Diagnosis not present

## 2014-10-20 DIAGNOSIS — I69954 Hemiplegia and hemiparesis following unspecified cerebrovascular disease affecting left non-dominant side: Secondary | ICD-10-CM | POA: Insufficient documentation

## 2014-10-20 DIAGNOSIS — K219 Gastro-esophageal reflux disease without esophagitis: Secondary | ICD-10-CM | POA: Insufficient documentation

## 2014-10-20 DIAGNOSIS — Z7982 Long term (current) use of aspirin: Secondary | ICD-10-CM | POA: Insufficient documentation

## 2014-10-20 DIAGNOSIS — F17210 Nicotine dependence, cigarettes, uncomplicated: Secondary | ICD-10-CM | POA: Diagnosis not present

## 2014-10-20 DIAGNOSIS — R059 Cough, unspecified: Secondary | ICD-10-CM

## 2014-10-20 DIAGNOSIS — K7460 Unspecified cirrhosis of liver: Secondary | ICD-10-CM | POA: Insufficient documentation

## 2014-10-20 DIAGNOSIS — Z85118 Personal history of other malignant neoplasm of bronchus and lung: Secondary | ICD-10-CM | POA: Diagnosis not present

## 2014-10-20 DIAGNOSIS — E1136 Type 2 diabetes mellitus with diabetic cataract: Secondary | ICD-10-CM | POA: Diagnosis not present

## 2014-10-20 LAB — PROTIME-INR
INR: 1.02 (ref 0.00–1.49)
PROTHROMBIN TIME: 13.6 s (ref 11.6–15.2)

## 2014-10-20 LAB — TYPE AND SCREEN
ABO/RH(D): B POS
Antibody Screen: NEGATIVE

## 2014-10-20 LAB — GLUCOSE, CAPILLARY
Glucose-Capillary: 155 mg/dL — ABNORMAL HIGH (ref 65–99)
Glucose-Capillary: 180 mg/dL — ABNORMAL HIGH (ref 65–99)
Glucose-Capillary: 281 mg/dL — ABNORMAL HIGH (ref 65–99)
Glucose-Capillary: 263 mg/dL — ABNORMAL HIGH (ref 65–99)
Glucose-Capillary: 262 mg/dL — ABNORMAL HIGH (ref 65–99)

## 2014-10-20 LAB — APTT: aPTT: 32 seconds (ref 24–37)

## 2014-10-20 SURGERY — RADIO FREQUENCY ABLATION
Anesthesia: General

## 2014-10-20 MED ORDER — PROMETHAZINE HCL 25 MG/ML IJ SOLN: 6.2500 mg | INTRAMUSCULAR | Status: DC | PRN

## 2014-10-20 MED ORDER — DOCUSATE SODIUM 100 MG PO CAPS
100.0000 mg | ORAL_CAPSULE | Freq: Two times a day (BID) | ORAL | Status: DC
  Administered 2014-10-20 – 2014-10-21 (×2): 100 mg via ORAL
  Filled 2014-10-20 (×3): qty 1

## 2014-10-20 MED ORDER — DEXTROSE 5 % IV SOLN
2.0000 g | Freq: Once | INTRAVENOUS | Status: AC
  Administered 2014-10-20: 2 g via INTRAVENOUS
  Filled 2014-10-20: qty 2

## 2014-10-20 MED ORDER — LIDOCAINE HCL (CARDIAC) 20 MG/ML IV SOLN
INTRAVENOUS | Status: DC | PRN
  Administered 2014-10-20: 100 mg via INTRAVENOUS

## 2014-10-20 MED ORDER — HYDROMORPHONE HCL 1 MG/ML IJ SOLN
INTRAMUSCULAR | Status: AC
  Filled 2014-10-20: qty 1

## 2014-10-20 MED ORDER — IOHEXOL 350 MG/ML SOLN
100.0000 mL | Freq: Once | INTRAVENOUS | Status: AC | PRN
  Administered 2014-10-20: 75 mL via INTRAVENOUS

## 2014-10-20 MED ORDER — FENTANYL CITRATE (PF) 100 MCG/2ML IJ SOLN
INTRAMUSCULAR | Status: DC | PRN
Start: 2014-10-20 — End: 2014-10-20
  Administered 2014-10-20: 50 ug via INTRAVENOUS

## 2014-10-20 MED ORDER — ONDANSETRON HCL 4 MG/2ML IJ SOLN
INTRAMUSCULAR | Status: DC | PRN
  Administered 2014-10-20: 4 mg via INTRAVENOUS

## 2014-10-20 MED ORDER — HYDROCODONE-ACETAMINOPHEN 5-325 MG PO TABS: 1.0000 | ORAL_TABLET | ORAL | Status: DC | PRN

## 2014-10-20 MED ORDER — METRONIDAZOLE IN NACL 5-0.79 MG/ML-% IV SOLN
500.0000 mg | Freq: Once | INTRAVENOUS | Status: AC
  Administered 2014-10-20: 500 mg via INTRAVENOUS
  Filled 2014-10-20: qty 100

## 2014-10-20 MED ORDER — DEXAMETHASONE SODIUM PHOSPHATE 10 MG/ML IJ SOLN
INTRAMUSCULAR | Status: DC | PRN
  Administered 2014-10-20: 10 mg via INTRAVENOUS

## 2014-10-20 MED ORDER — SODIUM CHLORIDE 0.9 % IV SOLN
10.0000 mg | INTRAVENOUS | Status: DC | PRN
  Administered 2014-10-20: 15 ug/min via INTRAVENOUS

## 2014-10-20 MED ORDER — ROCURONIUM BROMIDE 100 MG/10ML IV SOLN
INTRAVENOUS | Status: DC | PRN
  Administered 2014-10-20: 30 mg via INTRAVENOUS

## 2014-10-20 MED ORDER — ONDANSETRON HCL 4 MG/2ML IJ SOLN: 4.0000 mg | Freq: Four times a day (QID) | INTRAMUSCULAR | Status: DC | PRN

## 2014-10-20 MED ORDER — PHENYLEPHRINE HCL 10 MG/ML IJ SOLN
INTRAMUSCULAR | Status: DC | PRN
  Administered 2014-10-20 (×5): 80 ug via INTRAVENOUS

## 2014-10-20 MED ORDER — INSULIN ASPART 100 UNIT/ML ~~LOC~~ SOLN
0.0000 [IU] | Freq: Every day | SUBCUTANEOUS | Status: DC
  Administered 2014-10-20: 3 [IU] via SUBCUTANEOUS

## 2014-10-20 MED ORDER — LACTATED RINGERS IV SOLN
INTRAVENOUS | Status: DC
  Administered 2014-10-20 (×3): via INTRAVENOUS

## 2014-10-20 MED ORDER — ALBUTEROL SULFATE (2.5 MG/3ML) 0.083% IN NEBU: 3.0000 mL | INHALATION_SOLUTION | RESPIRATORY_TRACT | Status: DC | PRN

## 2014-10-20 MED ORDER — PROPOFOL 10 MG/ML IV BOLUS
INTRAVENOUS | Status: DC | PRN
  Administered 2014-10-20: 30 mg via INTRAVENOUS
  Administered 2014-10-20: 150 mg via INTRAVENOUS

## 2014-10-20 MED ORDER — NEOSTIGMINE METHYLSULFATE 10 MG/10ML IV SOLN
INTRAVENOUS | Status: DC | PRN
  Administered 2014-10-20: 3 mg via INTRAVENOUS

## 2014-10-20 MED ORDER — INSULIN ASPART 100 UNIT/ML ~~LOC~~ SOLN
0.0000 [IU] | Freq: Three times a day (TID) | SUBCUTANEOUS | Status: DC
  Administered 2014-10-20: 8 [IU] via SUBCUTANEOUS

## 2014-10-20 MED ORDER — HYDROMORPHONE HCL 1 MG/ML IJ SOLN
0.2500 mg | INTRAMUSCULAR | Status: DC | PRN
  Administered 2014-10-20 (×2): 0.5 mg via INTRAVENOUS

## 2014-10-20 MED ORDER — FENTANYL CITRATE (PF) 250 MCG/5ML IJ SOLN
INTRAMUSCULAR | Status: AC
  Filled 2014-10-20: qty 5

## 2014-10-20 MED ORDER — SUCCINYLCHOLINE CHLORIDE 20 MG/ML IJ SOLN
INTRAMUSCULAR | Status: DC | PRN
  Administered 2014-10-20: 100 mg via INTRAVENOUS

## 2014-10-20 MED ORDER — GLYCOPYRROLATE 0.2 MG/ML IJ SOLN
INTRAMUSCULAR | Status: DC | PRN
  Administered 2014-10-20: 0.4 mg via INTRAVENOUS

## 2014-10-20 NOTE — Procedures (Signed)
Post-Procedure Note  Pre-operative Diagnosis: Hepatocellular carcinoma       Post-operative Diagnosis: Hepatocellular carcinoma   Indications: [facility]   Procedure Details:   Informed consent obtained.  General anesthesia for procedure.  Placement of Neuwave PR probe into liver lesion with [place] and CT guidance.  Total treatment time was 7 minutes.    Findings: Hypoechoic lesion near junction of right and left hepatic lobes.  Probe placement confirmed with [place] (lesion not well demonstrated with CT).  Echogenic gas identified throughout lesion during treatment, as expected.    Complications: No immediate complication.     Condition: Stable  Plan: Recovery in PACU Overnight observation.
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
## 2014-10-20 NOTE — Anesthesia Procedure Notes (Signed)
Procedure Name: Intubation Date/Time: 10/20/2014 8:05 AM Performed by: Lind Covert Pre-anesthesia Checklist: Patient identified, Emergency Drugs available, Suction available, Patient being monitored and Timeout performed Patient Re-evaluated:Patient Re-evaluated prior to inductionOxygen Delivery Method: Circle system utilized Preoxygenation: Pre-oxygenation with 100% oxygen Intubation Type: IV induction Laryngoscope Size: Mac and 4 Grade View: Grade I Tube type: Oral Number of attempts: 1 Airway Equipment and Method: Stylet Placement Confirmation: ETT inserted through vocal cords under direct vision,  positive ETCO2 and breath sounds checked- equal and bilateral Secured at: 21 cm Tube secured with: Tape Dental Injury: Teeth and Oropharynx as per pre-operative assessment

## 2014-10-20 NOTE — Anesthesia Preprocedure Evaluation (Signed)
Anesthesia Evaluation  Patient identified by MRN, date of birth, ID band Patient awake    Reviewed: Allergy & Precautions, NPO status , Patient's Chart, lab work & pertinent test results  Airway Mallampati: II  TM Distance: >3 FB Neck ROM: Full    Dental no notable dental hx.    Pulmonary COPDCurrent Smoker,  breath sounds clear to auscultation  + wheezing      Cardiovascular negative cardio ROS Normal cardiovascular examRhythm:Regular Rate:Normal     Neuro/Psych CVA negative psych ROS   GI/Hepatic negative GI ROS, (+) Cirrhosis -      , Lung; hepatocellular cancer 08/2014       Cancer of lung   Hepatic cirrhosis      Endo/Other  negative endocrine ROSdiabetes  Renal/GU negative Renal ROS  negative genitourinary   Musculoskeletal negative musculoskeletal ROS (+)   Abdominal   Peds negative pediatric ROS (+)  Hematology negative hematology ROS (+)   Anesthesia Other Findings   Reproductive/Obstetrics negative OB ROS                             Anesthesia Physical Anesthesia Plan  ASA: IV  Anesthesia Plan: General   Post-op Pain Management:    Induction: Intravenous  Airway Management Planned: Oral ETT  Additional Equipment:   Intra-op Plan:   Post-operative Plan: Extubation in OR and Possible Post-op intubation/ventilation  Informed Consent: I have reviewed the patients History and Physical, chart, labs and discussed the procedure including the risks, benefits and alternatives for the proposed anesthesia with the patient or authorized representative who has indicated his/her understanding and acceptance.   Dental advisory given  Plan Discussed with: CRNA and Surgeon  Anesthesia Plan Comments:         Anesthesia Quick Evaluation

## 2014-10-20 NOTE — Transfer of Care (Signed)
Immediate Anesthesia Transfer of Care Note  Patient: Austin [name]  Procedure(s) Performed: Procedure(s): THERMAL ABLATION OF LIVER (N/A)  Patient Location: PACU  Anesthesia Type:General  Level of Consciousness: sedated  Airway & Oxygen Therapy: Patient Spontanous Breathing and Patient connected to face mask oxygen  Post-op Assessment: Report given to RN and Post -op Vital signs reviewed and stable  Post vital signs: Reviewed and stable  Last Vitals:  Filed Vitals:   10/20/14 0630  BP: 130/68  Pulse: 104  Temp: 36.6 C  Resp: 16    Complications: No apparent anesthesia complications

## 2014-10-20 NOTE — Anesthesia Postprocedure Evaluation (Signed)
  Anesthesia Post-op Note  Patient: Austin [name]  Procedure(s) Performed: Procedure(s) (LRB): THERMAL ABLATION OF LIVER (N/A)  Patient Location: PACU  Anesthesia Type: General  Level of Consciousness: awake and alert   Airway and Oxygen Therapy: Patient Spontanous Breathing  Post-op Pain: mild  Post-op Assessment: Post-op Vital signs reviewed, Patient's Cardiovascular Status Stable, Respiratory Function Stable, Patent Airway and No signs of Nausea or vomiting  Last Vitals:  Filed Vitals:   10/20/14 1605  BP: 119/54  Pulse: 102  Temp: 36.5 C  Resp: 18    Post-op Vital Signs: stable   Complications: No apparent anesthesia complications
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
## 2014-10-20 NOTE — Progress Notes (Signed)
Day of Surgery  Subjective: Pt without sig c/o; denies abd pain,N/V  Objective: Vital signs in last 24 hours: Temp:  [94.4 F (34.7 C)-98 F (36.7 C)] 97.7 F (36.5 C) (07/01 1605) Pulse Rate:  [96-106] 102 (07/01 1605) Resp:  [15-18] 18 (07/01 1605) BP: (102-136)/(54-91) 119/54 mmHg (07/01 1605) SpO2:  [96 %-100 %] 96 % (07/01 1605) Weight:  [178 lb (80.74 kg)] 178 lb (80.74 kg) (07/01 1233)    Intake/Output from previous day:   Intake/Output this shift: Total I/O In: 2020 [P.O.:720; I.V.:1300] Out: 2300 [Urine:2300] Puncture site epig region clean and dry, dressing intact, no evidence of bleeding,NT  Lab Results:  No results for input(s): WBC, HGB, HCT, PLT in the last 72 hours. BMET No results for input(s): NA, K, CL, CO2, GLUCOSE, BUN, CREATININE, CALCIUM in the last 72 hours. PT/INR  Recent Labs  10/20/14 0720  LABPROT 13.6  INR 1.02   ABG No results for input(s): PHART, HCO3 in the last 72 hours.  Invalid input(s): PCO2, PO2  Studies/Results: Ct Guide Tissue Ablation  10/20/2014   CLINICAL DATA:  81 year old with a solitary hepatocellular carcinoma. Presents presents for image guided thermal ablation.  EXAM: THERMAL ABLATION OF LIVER LESION WITH CT AND ULTRASOUND GUIDANCE  Physician: Austin [name]. Austin Pancoast, MD  FLUOROSCOPY TIME:  None  MEDICATIONS: Flagyl andAztreonam were given prior to the procedure.  ANESTHESIA/SEDATION: General anesthesia  CONTRAST:  150 mL Omnipaque 350  PROCEDURE: The procedure was explained to the patient. The risks and benefits of the procedure were discussed and the patient's questions were addressed. Informed consent was obtained from the patient. Patient was brought into the CT scanner. The patient was placed under general anesthesia. CT images of the abdomen were obtained. The liver was also evaluated with ultrasound. Hypoechoic lesion in the central liver was identified with ultrasound. The anterior abdomen was prepped and draped in sterile  fashion. Maximal barrier sterile technique was utilized including caps, mask, sterile gowns, sterile gloves, sterile drape, hand hygiene and skin antiseptic. A 22 gauge Chiba needle was directed into the lesion with ultrasound guidance. Needle position was evaluated with CT. The 22 gauge needle was exchanged for a 17 gauge Neuwave PR probe. The needle was directed into the lesion with ultrasound guidance. Probe placement was difficult because the probe was difficult to identify with ultrasound and the lesion was not well demonstrated with CT. Post contrast CT images were performed in order to better identify the vascular structures. Ablation probe was positioned just beyond the lesion with ultrasound guidance. The liver was ablated for 7 minutes at 22 Watts. The ablation was watched in real-time with ultrasound guidance. Echogenic gas was identified throughout the lesion. Follow up CT images were obtained following administration of contrast. The probe was removed using the cautery mode. Ablation probe was removed without complication. Bandage placed over the puncture site.  FINDINGS: There is a hypoechoic lesion in the central liver. This lesion corresponds with the biopsied lesion and lesion on recent MRI. Lesion was not visible on the non contrast CT images. Probe placement was performed with ultrasound guidance. The procedure was technically difficult because the probe was difficult to see due the liver echotexture. Probe position was confirmed within the lesion and the needle was placed just beyond the lesion. Post contrast CT images showed that the probe was not traversing a main portal branch. The lesion and probe were adjacent to a small hepatic vein. During the ablation, there was echogenic material/gas throughout the lesion lesion.  Follow up CT images demonstrated hypo attenuation at the ablation site which also corresponds with the lesion. No significant perihepatic bleeding.  Estimated blood loss:  Minimal  COMPLICATIONS: None  IMPRESSION: Successful microwave ablation of the central liver lesion using ultrasound and CT guidance.   Electronically Signed   By: Austin [name] M.D.   On: 10/20/2014 15:09    Anti-infectives: Anti-infectives    None      Assessment/Plan: s/p thermal ablation of HCC at junction of right/left hepatic lobes 7/1; for overnight obs, check am labs; f/u with Dr. Anselm [name] in [place] in 2-4 weeks     Austin [name] 10/20/2014

## 2014-10-21 ENCOUNTER — Other Ambulatory Visit: Payer: Self-pay | Admitting: Internal Medicine

## 2014-10-21 ENCOUNTER — Observation Stay (HOSPITAL_COMMUNITY): Payer: Commercial Managed Care - HMO

## 2014-10-21 DIAGNOSIS — C220 Liver cell carcinoma: Secondary | ICD-10-CM | POA: Diagnosis not present

## 2014-10-21 DIAGNOSIS — E1165 Type 2 diabetes mellitus with hyperglycemia: Secondary | ICD-10-CM | POA: Diagnosis not present

## 2014-10-21 LAB — COMPREHENSIVE METABOLIC PANEL
ALBUMIN: 3.2 g/dL — AB (ref 3.5–5.0)
ALK PHOS: 143 U/L — AB (ref 38–126)
ALT: 100 U/L — ABNORMAL HIGH (ref 17–63)
AST: 105 U/L — AB (ref 15–41)
Anion gap: 9 (ref 5–15)
BILIRUBIN TOTAL: 0.8 mg/dL (ref 0.3–1.2)
BUN: 16 mg/dL (ref 6–20)
CO2: 27 mmol/L (ref 22–32)
CREATININE: 1.06 mg/dL (ref 0.61–1.24)
Calcium: 8.9 mg/dL (ref 8.9–10.3)
Chloride: 98 mmol/L — ABNORMAL LOW (ref 101–111)
GFR calc Af Amer: >60 mL/min (ref >60)
GFR calc non Af Amer: >60 mL/min (ref >60)
GLUCOSE: 201 mg/dL — AB (ref 65–99)
Potassium: 5.1 mmol/L (ref 3.5–5.1)
Sodium: 134 mmol/L — ABNORMAL LOW (ref 135–145)
Total Protein: 6.4 g/dL — ABNORMAL LOW (ref 6.5–8.1)

## 2014-10-21 LAB — CBC
HEMATOCRIT: 38.5 % — AB (ref 39.0–52.0)
HEMOGLOBIN: 13.5 g/dL (ref 13.0–17.0)
MCH: 32.1 pg (ref 26.0–34.0)
MCHC: 35.1 g/dL (ref 30.0–36.0)
MCV: 91.7 fL (ref 78.0–100.0)
Platelets: 212 10*3/uL (ref 150–400)
RBC: 4.2 MIL/uL — AB (ref 4.22–5.81)
RDW: 14.5 % (ref 11.5–15.5)
WBC: 17 10*3/uL — ABNORMAL HIGH (ref 4.0–10.5)

## 2014-10-21 LAB — GLUCOSE, CAPILLARY: Glucose-Capillary: 165 mg/dL — ABNORMAL HIGH (ref 65–99)

## 2014-10-21 MED ORDER — FREESTYLE SYSTEM KIT: 1.0000 | PACK | Status: DC | PRN

## 2014-10-21 MED ORDER — INSULIN ASPART 100 UNIT/ML ~~LOC~~ SOLN
4.0000 [IU] | Freq: Three times a day (TID) | SUBCUTANEOUS | Status: DC
  Administered 2014-10-21: 4 [IU] via SUBCUTANEOUS

## 2014-10-21 MED ORDER — METFORMIN HCL 500 MG PO TABS: 500.0000 mg | ORAL_TABLET | Freq: Two times a day (BID) | ORAL | Status: DC

## 2014-10-21 MED ORDER — INSULIN ASPART 100 UNIT/ML ~~LOC~~ SOLN: 0.0000 [IU] | Freq: Every day | SUBCUTANEOUS | Status: DC

## 2014-10-21 MED ORDER — INSULIN GLARGINE 100 UNIT/ML ~~LOC~~ SOLN
10.0000 [IU] | Freq: Every day | SUBCUTANEOUS | Status: DC
Start: 2014-10-21 — End: 2014-10-21

## 2014-10-21 MED ORDER — INSULIN ASPART 100 UNIT/ML ~~LOC~~ SOLN
0.0000 [IU] | Freq: Three times a day (TID) | SUBCUTANEOUS | Status: DC
  Administered 2014-10-21: 3 [IU] via SUBCUTANEOUS

## 2014-10-21 NOTE — Discharge Instructions (Signed)
Radiofrequency/Microwave-Thermal Ablation of Liver Tumors Ablation is a procedure that destroys specific cells in the body. Radiofrequency means that high-energy radio waves are used for this procedure. In radiofrequency ablation of liver tumors, a needle-like probe is placed close to the tumor. The probe uses radio waves to produce heat that kills the cancer cells. Months later, the dead tumor cells turn into harmless scar tissue. This procedure is usually used:   For smaller tumors (less than about 1 in [3.8 cm]).   In people whose medical condition makes surgery too dangerous.   For tumors that are in risky locations, have not shrunk with chemotherapy, or that have come back after having been removed through surgery.   In people who have certain types of liver cancer and are on a waiting list for a liver transplant.  [facility] CARE PROVIDER KNOW ABOUT:   All allergies you have.  All medicines that you are taking, including vitamins, herbs, eye drops, creams, and over-the-counter medicines.  Previous problems you or members of your family have had with the use of anesthetics.  Any blood disorders you have.  Previous surgeries you have had.  Medical conditions you have.  Possibility of pregnancy, if this applies. RISKS AND COMPLICATIONS  Generally, this is a safe procedure. However, as with any procedure, complications can occur. Possible complications include:   Infection.   Bleeding.   Pain.   Flu-like symptoms, including fever and achiness.   Injury to the surrounding organs, such as a lung or the intestines. BEFORE THE PROCEDURE   You may have blood tests done. These tests can help show how well your kidneys and liver are working. They can also show how well your blood clots.   Only take medicines as directed by your health care provider. You may need to stop taking medicines (such as blood thinners, aspirin, or nonsteroidal anti-inflammatory drugs)  before the procedure.   Do not eat or drink for at least 6 hours before the procedure, or as directed by your health care provider.   Arrange for someone to drive you home after the procedure.  PROCEDURE   You will lie on an exam table and will be connected to monitors that keep track of your heart rate, blood pressure, and breathing throughout the procedure. You will have an IV tube placed in a vein.   You may be given a medicine that makes you go to sleep throughout the procedure (general anesthetic) or a medicine that helps you relax (sedative). You may also be given a medicine to numb the area where the probe will pass through the skin (local anesthetic).   Radiofrequency ablation can be done:   Through a regular surgical cut (incision). This type of ablation is called surgical radiofrequency ablation.   Through a tiny surgical incision, using a camera-like device to guide the probe to the liver tumor. This type of ablation is called laparoscopic radiofrequency ablation.   By using needle-sized electrodes that are passed through the skin directly into the area of the liver being treated. This type of ablation is called percutaneous radiofrequency ablation.   Ultrasound or CT scans are used to make sure the tip of the probe is in the right location.   Once the probe has been situated next to the tumor, radio waves will produce heat that kills the tumor cells. Depending on the size of the tumor, the probe may need to be repositioned several times.   Once the tumor has been destroyed,  the probe will be removed and a bandage will be applied.  AFTER THE PROCEDURE  If you received a sedative or a general anesthetic during the procedure, you will be sleepy for the first few hours.   You may have some pain or nausea. This can usually be controlled with medicines.   You will stay in the recovery room until you are awake and able to drink fluids.  Document Released:  08/24/2008 Document Revised: 04/12/2013 Document Reviewed: 12/13/2012 [facility] Patient Information 2015 [place], [place]. This information is not intended to replace advice given to you by your health care provider. Make sure you discuss any questions you have with your health care provider.

## 2014-10-21 NOTE — Progress Notes (Signed)
Internal medicine consult Progress Note   Austin [name] DOB: 07/06/1933 DOA: 10/20/2014 PCP: Dimas Chyle, MD   Brief Narrative:   Austin [name] is an 81 y.o. male with biopsy-proven hepatocellular carcinoma, with recent MRI demonstrating that this is a solitary lesion who was admitted by IR today after undergoing a CT guided thermal ablation of the solitary liver lesion. Postprocedure, he was noted to have elevated blood glucoses. His other PMH includes alcohol abuse, quit drinking in 2000 and recent diagnosis of diet-controlled diabetes with a hemoglobin A1c of 6.7%. Internal medicine was asked to consult to provide guidance regarding elevated blood glucose is.  Assessment/Plan:   Active Problems:   HCC (hepatocellular carcinoma)  - Status post CT-guided thermal ablation per IR.    Type 2 diabetes mellitus with hyperglycemia - Treated with Lantus/SSI while in the hospital. - He will need close follow-up with PCP. - Discharged on metformin 500 mg twice a day. - Given a prescription for a glucometer. - Advised to check his blood sugars BID and to bring a log of his blood sugar results to his PCPs office for review.   Code Status:     Code Status Orders        Start     Ordered   10/20/14 1142  Full code   Continuous     10/20/14 1141    Advance Directive Documentation        Most Recent Value   Type of Advance Directive  Healthcare Power of Attorney, Living will   Pre-existing out of facility DNR order (yellow form or pink MOST form)     "MOST" Form in Place?         Anti-Infectives:    None.  Subjective:    Austin [name] feels well today and is anxious to go home. Less thirsty with less polyuria. Appetite okay.  Objective:    Filed Vitals:   10/20/14 1456 10/20/14 1605 10/20/14 2100 10/21/14 0430  BP: 102/61 119/54 113/62 114/66  Pulse: 101 102 98 80  Temp: 97.6 F (36.4 C) 97.7 F (36.5 C) 97.9 F (36.6 C) 97.7 F (36.5 C)    TempSrc: Oral Oral Oral Oral  Resp: '18 18 17 18  '$ Height:      Weight:      SpO2: 99% 96% 98% 99%    Intake/Output Summary (Last 24 hours) at 10/21/14 0710 Last data filed at 10/21/14 0444  Gross per 24 hour  Intake 2878.5 ml  Output   3300 ml  Net -421.5 ml    Exam: Gen:  NAD Cardiovascular:  RRR, No M/R/G Respiratory:  Lungs CTAB Gastrointestinal:  Abdomen soft, NT/ND, + BS Extremities:  No C/E/C   Data Reviewed:    Labs: Basic Metabolic Panel:  Recent Labs Lab 10/17/14 1105 10/21/14 0416  NA 128* 134*  K 4.5 5.1  CL 95* 98*  CO2 24 27  GLUCOSE 396* 201*  BUN 17 16  CREATININE 1.17 1.06  CALCIUM 8.6* 8.9   GFR Estimated Creatinine Clearance: 60.2 mL/min (by C-G formula based on Cr of 1.06). Liver Function Tests:  Recent Labs Lab 10/17/14 1105 10/21/14 0416  AST 24 105*  ALT 29 100*  ALKPHOS 123 143*  BILITOT 0.6 0.8  PROT 6.9 6.4*  ALBUMIN 3.7 3.2*   Coagulation profile  Recent Labs Lab 10/17/14 1105 10/20/14 0720  INR 1.00 1.02    CBC:  Recent Labs Lab 10/17/14 1105 10/21/14 0416  WBC  8.6 17.0*  NEUTROABS 5.0  --   HGB 14.3 13.5  HCT 40.8 38.5*  MCV 91.5 91.7  PLT 211 212   CBG:  Recent Labs Lab 10/20/14 0617 10/20/14 1203 10/20/14 1651 10/20/14 1755 10/20/14 2142  GLUCAP 155* 180* 281* 263* 262*    Microbiology No results found for this or any previous visit (from the past 240 hour(s)).   Medications:   . docusate sodium  100 mg Oral BID  . insulin aspart  0-15 Units Subcutaneous TID WC  . insulin aspart  0-5 Units Subcutaneous QHS   Continuous Infusions:   Time spent: 25 minutes.     [facility] Pager [phone]. If unable to reach me by pager, please call my cell phone at [phone].  *Please refer to [email or web address], password [email or web address] to get updated schedule on who will round on this patient, as hospitalists switch teams weekly. If 7PM-7AM, please contact night-coverage at [email or web address],  password [email or web address] for any overnight needs.  10/21/2014, 7:10 AM
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
## 2014-10-21 NOTE — Progress Notes (Signed)
Patient has voided since the removal of foley at approximately 1730 yesterday.

## 2014-10-21 NOTE — Progress Notes (Signed)
Nursing Discharge Summary  Patient ID: Austin [name] MRN: [id number] DOB/AGE: 1934-01-18 81 y.o.  Admit date: 10/20/2014 Discharge date: 10/21/2014  Discharged Condition: good  Disposition: 01-Home or Self Care  Follow-up Information    Follow up with [facility], ADAM Thurmond Butts, MD.   Specialty:  Interventional Radiology   Why:  Radiology will call you with follow up appointment with Dr. Anselm Pancoast in the interventional radiology clinic in 2-4 weeks. Call [phone] or [phone] with any questions.   Contact information:   [address] [place] [address] [phone]       Follow up with Eilleen Kempf., MD.   Specialty:  Oncology   Why:  Continue care with Dr. Julien Nordmann has scheduled   Contact information:   [place] [address] [phone]       Follow up with [name],[name], MD. Schedule an appointment as soon as possible for a visit in 1 week.   Specialty:  Internal Medicine   Why:  Follow up of diabetes.   Contact information:   [place] [address] [phone]       Prescriptions Given: Patient prescriptions for metformin and glucose meter was called into the pharmacy by Dr Rockne Menghini. Patient medications and follow up appointments discussed as well as when to call the doctor. Patient and sister both verbalized understanding without further questions.    Means of Discharge: Patient ambulated downstairs to be discharged home via private vehicle.  Signed: Buel Ream 10/21/2014, 12:25 PM

## 2014-10-22 ENCOUNTER — Other Ambulatory Visit: Payer: Self-pay | Admitting: Radiology

## 2014-10-22 DIAGNOSIS — C220 Liver cell carcinoma: Secondary | ICD-10-CM

## 2014-10-24 ENCOUNTER — Other Ambulatory Visit: Payer: Self-pay | Admitting: *Deleted

## 2014-10-24 DIAGNOSIS — C220 Liver cell carcinoma: Secondary | ICD-10-CM

## 2014-10-24 LAB — HEMOGLOBIN A1C
HEMOGLOBIN A1C: 8.1 % — AB (ref 4.8–5.6)
Mean Plasma Glucose: 186 mg/dL

## 2014-10-30 ENCOUNTER — Ambulatory Visit: Payer: Commercial Managed Care - HMO | Admitting: Internal Medicine

## 2014-10-30 ENCOUNTER — Other Ambulatory Visit (HOSPITAL_COMMUNITY): Payer: Self-pay | Admitting: Diagnostic Radiology

## 2014-10-30 DIAGNOSIS — C220 Liver cell carcinoma: Secondary | ICD-10-CM

## 2014-10-31 ENCOUNTER — Encounter: Payer: Self-pay | Admitting: Family Medicine

## 2014-10-31 ENCOUNTER — Encounter: Payer: Self-pay | Admitting: Internal Medicine

## 2014-10-31 ENCOUNTER — Other Ambulatory Visit (HOSPITAL_BASED_OUTPATIENT_CLINIC_OR_DEPARTMENT_OTHER): Payer: Commercial Managed Care - HMO

## 2014-10-31 ENCOUNTER — Ambulatory Visit (HOSPITAL_BASED_OUTPATIENT_CLINIC_OR_DEPARTMENT_OTHER): Payer: Commercial Managed Care - HMO | Admitting: Internal Medicine

## 2014-10-31 ENCOUNTER — Ambulatory Visit (INDEPENDENT_AMBULATORY_CARE_PROVIDER_SITE_OTHER): Payer: Commercial Managed Care - HMO | Admitting: Family Medicine

## 2014-10-31 ENCOUNTER — Telehealth: Payer: Self-pay | Admitting: Internal Medicine

## 2014-10-31 VITALS — BP 120/61 | HR 97 | Temp 98.3°F | Resp 16 | Ht 71.0 in | Wt 173.0 lb

## 2014-10-31 VITALS — BP 137/44 | HR 97 | Temp 98.4°F | Resp 18 | Ht 71.0 in | Wt 173.4 lb

## 2014-10-31 DIAGNOSIS — C3432 Malignant neoplasm of lower lobe, left bronchus or lung: Secondary | ICD-10-CM

## 2014-10-31 DIAGNOSIS — E119 Type 2 diabetes mellitus without complications: Secondary | ICD-10-CM | POA: Diagnosis not present

## 2014-10-31 DIAGNOSIS — Z720 Tobacco use: Secondary | ICD-10-CM | POA: Diagnosis not present

## 2014-10-31 DIAGNOSIS — C220 Liver cell carcinoma: Secondary | ICD-10-CM | POA: Diagnosis not present

## 2014-10-31 DIAGNOSIS — C3490 Malignant neoplasm of unspecified part of unspecified bronchus or lung: Secondary | ICD-10-CM

## 2014-10-31 LAB — CBC WITH DIFFERENTIAL/PLATELET
BASO%: 1.1 % (ref 0.0–2.0)
Basophils Absolute: 0.1 10*3/uL (ref 0.0–0.1)
EOS ABS: 0.1 10*3/uL (ref 0.0–0.5)
EOS%: 1.4 % (ref 0.0–7.0)
HEMATOCRIT: 41.2 % (ref 38.4–49.9)
HGB: 14.3 g/dL (ref 13.0–17.1)
LYMPH%: 23.7 % (ref 14.0–49.0)
MCH: 32.3 pg (ref 27.2–33.4)
MCHC: 34.7 g/dL (ref 32.0–36.0)
MCV: 93 fL (ref 79.3–98.0)
MONO#: 0.9 10*3/uL (ref 0.1–0.9)
MONO%: 10.6 % (ref 0.0–14.0)
NEUT#: 5.6 10*3/uL (ref 1.5–6.5)
NEUT%: 63.2 % (ref 39.0–75.0)
Platelets: 253 10*3/uL (ref 140–400)
RBC: 4.43 10*6/uL (ref 4.20–5.82)
RDW: 15.4 % — AB (ref 11.0–14.6)
WBC: 8.8 10*3/uL (ref 4.0–10.3)
lymph#: 2.1 10*3/uL (ref 0.9–3.3)

## 2014-10-31 LAB — COMPREHENSIVE METABOLIC PANEL (CC13)
ALT: 30 U/L (ref 0–55)
ANION GAP: 9 meq/L (ref 3–11)
AST: 18 U/L (ref 5–34)
Albumin: 3.3 g/dL — ABNORMAL LOW (ref 3.5–5.0)
Alkaline Phosphatase: 144 U/L (ref 40–150)
BUN: 15.2 mg/dL (ref 7.0–26.0)
CALCIUM: 9.1 mg/dL (ref 8.4–10.4)
CO2: 23 mEq/L (ref 22–29)
Chloride: 98 mEq/L (ref 98–109)
Creatinine: 1.1 mg/dL (ref 0.7–1.3)
EGFR: 63 mL/min/{1.73_m2} — ABNORMAL LOW (ref >90)
Glucose: 331 mg/dl — ABNORMAL HIGH (ref 70–140)
Potassium: 4.5 mEq/L (ref 3.5–5.1)
Sodium: 130 mEq/L — ABNORMAL LOW (ref 136–145)
TOTAL PROTEIN: 6.6 g/dL (ref 6.4–8.3)
Total Bilirubin: 0.49 mg/dL (ref 0.20–1.20)

## 2014-10-31 NOTE — Telephone Encounter (Signed)
s.w. pt and advised on Sept appt pt ok and aware °

## 2014-10-31 NOTE — Patient Instructions (Signed)
Continue metformin at 500 mg twice a day. Below is a diet that is good for diabetic. Avoid carbs and sweets. Eats lots of fresh fruits and vegetables Come back in one month and bring your meter.       Diabetes Mellitus and Food It is important for you to manage your blood sugar (glucose) level. Your blood glucose level can be greatly affected by what you eat. Eating healthier foods in the appropriate amounts throughout the day at about the same time each day will help you control your blood glucose level. It can also help slow or prevent worsening of your diabetes mellitus. Healthy eating may even help you improve the level of your blood pressure and reach or maintain a healthy weight.  HOW CAN FOOD AFFECT ME? Carbohydrates Carbohydrates affect your blood glucose level more than any other type of food. Your dietitian will help you determine how many carbohydrates to eat at each meal and teach you how to count carbohydrates. Counting carbohydrates is important to keep your blood glucose at a healthy level, especially if you are using insulin or taking certain medicines for diabetes mellitus. Alcohol Alcohol can cause sudden decreases in blood glucose (hypoglycemia), especially if you use insulin or take certain medicines for diabetes mellitus. Hypoglycemia can be a life-threatening condition. Symptoms of hypoglycemia (sleepiness, dizziness, and disorientation) are similar to symptoms of having too much alcohol.  If your health care provider has given you approval to drink alcohol, do so in moderation and use the following guidelines:  Women should not have more than one drink per day, and men should not have more than two drinks per day. One drink is equal to:  12 oz of beer.  5 oz of wine.  1 oz of hard liquor.  Do not drink on an empty stomach.  Keep yourself hydrated. Have water, diet soda, or unsweetened iced tea.  Regular soda, juice, and other mixers might contain a lot of  carbohydrates and should be counted. WHAT FOODS ARE NOT RECOMMENDED? As you make food choices, it is important to remember that all foods are not the same. Some foods have fewer nutrients per serving than other foods, even though they might have the same number of calories or carbohydrates. It is difficult to get your body what it needs when you eat foods with fewer nutrients. Examples of foods that you should avoid that are high in calories and carbohydrates but low in nutrients include:  Trans fats (most processed foods list trans fats on the Nutrition Facts label).  Regular soda.  Juice.  Candy.  Sweets, such as cake, pie, doughnuts, and cookies.  Fried foods. WHAT FOODS CAN I EAT? Have nutrient-rich foods, which will nourish your body and keep you healthy. The food you should eat also will depend on several factors, including:  The calories you need.  The medicines you take.  Your weight.  Your blood glucose level.  Your blood pressure level.  Your cholesterol level. You also should eat a variety of foods, including:  Protein, such as meat, poultry, fish, tofu, nuts, and seeds (lean animal proteins are best).  Fruits.  Vegetables.  Dairy products, such as milk, cheese, and yogurt (low fat is best).  Breads, grains, pasta, cereal, rice, and beans.  Fats such as olive oil, trans fat-free margarine, canola oil, avocado, and olives. DOES EVERYONE WITH DIABETES MELLITUS HAVE THE SAME MEAL PLAN? Because every person with diabetes mellitus is different, there is not one meal plan that  works for everyone. It is very important that you meet with a dietitian who will help you create a meal plan that is just right for you. Document Released: 01/02/2005 Document Revised: 04/12/2013 Document Reviewed: 03/04/2013 [facility] Patient Information 2015 [place], [place]. This information is not intended to replace advice given to you by your health care provider. Make sure you discuss any  questions you have with your health care provider.
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
## 2014-10-31 NOTE — Progress Notes (Signed)
Patient ID: Austin [name], male   DOB: [birth year], 81 y.o.   MRN: [id number]   Cainen Burnham, is a 81 y.o. male  YYT:[id number]  EXN:[id number]  DOB - Jun 11, 1933  CC:  Chief Complaint  Patient presents with  . Diabetes       HPI: Austin [name] is a 81 y.o. male here for follow-up on Diabetes. His diabetes has been diet controlled. He as last seen here in April. He had a procedure about a week ago and his blood sugar was 201.His A1C 8.3. He was started on metformin 500 bid and asked to follow-up with [place]. He is currently being treated for hepatacellar carcinoma. Has a history of lung cancer which is in remission. He is a smoker and experiences some cough and shortness of breath. He has a history of kidney stones. GERD, alcoholism and he is s/p cholecystectomy/   He reports not taking his metformin today. He had an appointment elsewhere this am and his bs was 333. A review of his blood sugars on his meter average about 175-180.   Allergies  Allergen Reactions  . Penicillins Swelling   Past Medical History  Diagnosis Date  . Blood transfusion   . Cataract   . Stroke     1980's with left sided weakness - has currently improved  . Shortness of breath     WITH COUGH pt states has had since receiving chemo txs  . Kidney stones     hx of   . GERD (gastroesophageal reflux disease)     pt states not using medication   . Status post chemotherapy     last tx approx 3 years ago   . Alcoholism     hx of quit in 2000 after inpt therapy   . Shaking     hands bilat more per right   . Trauma     right fifth finger / amputation   . Cancer 06/2011    lung; hepatocellular cancer 08/2014  . Cancer of lung   . Hepatic cirrhosis   . Diabetes type 2, controlled 09/07/2014   Current Outpatient Prescriptions on File Prior to Visit  Medication Sig Dispense Refill  . albuterol (PROAIR HFA) 108 (90 BASE) MCG/ACT inhaler Inhale 1-2 puffs into the lungs every 4 (four) hours as needed for wheezing or  shortness of breath. 6.7 g 5  . aspirin 81 MG tablet Take 81 mg by mouth every morning.     . feeding supplement (ENSURE COMPLETE) LIQD Take 237 mLs by mouth 2 (two) times daily between meals. (Patient taking differently: Take 237 mLs by mouth daily. ) 30 Bottle 0  . glucose monitoring kit (FREESTYLE) monitoring kit 1 each by Does not apply route as needed for other. Check your blood sugars twice a day. Dispense 60 strips/month, lancets. 1 each 1  . metFORMIN (GLUCOPHAGE) 500 MG tablet Take 1 tablet (500 mg total) by mouth 2 (two) times daily with a meal. 60 tablet 1  . Naphazoline-Glycerin (REDNESS RELIEF OP) Apply 1-2 drops to eye daily as needed (red dry eyes.).    Marland Kitchen sodium chloride (OCEAN) 0.65 % SOLN nasal spray Place 1 spray into both nostrils at bedtime as needed for congestion.      No current facility-administered medications on file prior to visit.   Family History  Problem Relation Age of Onset  . Anesthesia problems Neg Hx   . Hypotension Neg Hx   . Malignant hyperthermia Neg Hx   . Pseudochol deficiency  Neg Hx   . Cardiomyopathy Mother     Rheumatic heart Disease  . Arthritis Sister   . Diabetes Brother   . Alcohol abuse Sister    History   Social History  . Marital Status: Single    Spouse Name: N/A  . Number of Children: N/A  . Years of Education: N/A   Occupational History  . Retired     Architect   Social History Main Topics  . Smoking status: Current Every Day Smoker -- 0.50 packs/day for 55 years    Types: Cigarettes  . Smokeless tobacco: Never Used     Comment: 10 cigs a day  . Alcohol Use: No     Comment: quit 2000   . Drug Use: No  . Sexual Activity: Not Currently   Other Topics Concern  . Not on file   Social History Narrative    Review of Systems: Constitutional: Negative for fever, chills, appetite change, weight loss Positive for  fatigue. HENT: Negative for ear pain, ear discharge.nose bleeds Eyes: Negative for pain, discharge,  redness, itching and visual disturbance. Neck: Negative for stiffness. Positive for occassional pain Respiratory: Positive  for cough, shortness of breath,   Cardiovascular: Negative for chest pain, palpitations and leg swelling. Gastrointestinal: Negative for abdominal distention, nausea, vomiting, diarrhea, constipations. Postive for some adb pain since cholecystectomy Genitourinary: Negative for dysuria,  hematuria, flank pain, Positive for some frequency and urgency Musculoskeletal: Negative for back pain, joint pain, joint  swelling, arthralgia and gait problem.Positive for bilateral leg weakness Neurological: Negative for dizziness, tremors, seizures, syncope,   light-headedness, numbness and headaches.  Hematological: Negative f or bleeding. Positive for easy age related bruising. Psychiatric/Behavioral: Negative for depression, anxiety, decreased concentration, confusion   Objective:   Filed Vitals:   10/31/14 1333  BP: 120/61  Pulse: 97  Temp: 98.3 F (36.8 C)  Resp: 16    Physical Exam: Constitutional: Patient appears well-developed and well-nourished. No distress. Skin: Warm and dry with numerous bruises. HENT: Normocephalic, atraumatic, External right and left ear normal. Oropharynx is clear and moist.  Eyes: Conjunctivae and EOM are normal. PERRLA, no scleral icterus. Neck: Normal ROM. Neck supple. No lymphadenopathy, No thyromegaly. CVS: RRR, S1/S2 +, no murmurs, no gallops, no rubs Pulmonary: Effort normal, BS coarse and rough. no stridor, rhonchi, wheezes, rales.  Abdominal: Soft. Normoactive BS,, no distension, tenderness, rebound or guarding.  Musculoskeletal: Normal range of motion. No edema and no tenderness.  Neuro: Alert.Normal muscle tone coordination. Non-focal Psychiatric: Normal mood and affect. Behavior, judgment, thought content normal.  Lab Results  Component Value Date   WBC 8.8 10/31/2014   HGB 14.3 10/31/2014   HCT 41.2 10/31/2014   MCV 93.0  10/31/2014   PLT 253 10/31/2014   Lab Results  Component Value Date   CREATININE 1.1 10/31/2014   BUN 15.2 10/31/2014   NA 130* 10/31/2014   K 4.5 10/31/2014   CL 98* 10/21/2014   CO2 23 10/31/2014    Lab Results  Component Value Date   HGBA1C 8.1* 10/20/2014   Lipid Panel     Component Value Date/Time   CHOL 126 08/14/2014 1055   TRIG 156* 08/14/2014 1055   HDL 25* 08/14/2014 1055   CHOLHDL 5.0 08/14/2014 1055   VLDL 31 08/14/2014 1055   LDLCALC 70 08/14/2014 1055       Assessment and plan:   Diabetes, type II Continue metformin 500 bid  Provided copy of diabetes eating plan Advised to walk for 30  minutes 5/7 days Follow-up in one month and bring meter.   The patient was given clear instructions to go to ER or return to medical center if symptoms don't improve, worsen or new problems develop. The patient verbalized understanding. The patient was told to call to get lab results if they haven't heard anything in the next week.        Micheline Chapman, MSN, FNP-BC   10/31/2014, 1:41 PM

## 2014-11-01 LAB — AFP TUMOR MARKER: AFP-Tumor Marker: 23.3 ng/mL — ABNORMAL HIGH (ref <6.1)

## 2014-11-03 ENCOUNTER — Telehealth: Payer: Self-pay | Admitting: *Deleted

## 2014-11-03 NOTE — Telephone Encounter (Signed)
Oncology Nurse Navigator Documentation  Oncology Nurse Navigator Flowsheets 11/03/2014  Navigator Encounter Type Telephone/I called patient to check on him and his smoking cessation.  He stated he is still smoking.  I listened as he spoke about how much he was smoking.  I instructed him on a quit method.  I asked that he put the 10 cig in a plastic bag and each week to take one out and eventually quit.  He stated he may try.  I asked that he call me if I can help more.    Treatment Phase Observation   Barriers/Navigation Needs Education  Education Smoking cessation   Interventions Smoking cessation instructions   Time Spent with Patient (Retired) 30

## 2014-11-21 ENCOUNTER — Other Ambulatory Visit: Payer: Self-pay | Admitting: Medical Oncology

## 2014-11-24 ENCOUNTER — Inpatient Hospital Stay (HOSPITAL_COMMUNITY): Payer: Commercial Managed Care - HMO

## 2014-11-24 ENCOUNTER — Inpatient Hospital Stay (HOSPITAL_COMMUNITY)
Admission: EM | Admit: 2014-11-24 | Discharge: 2014-11-30 | DRG: 444 | Disposition: A | Discharge status: Home / Self Care | Payer: Commercial Managed Care - HMO | Attending: Internal Medicine | Admitting: Internal Medicine

## 2014-11-24 ENCOUNTER — Encounter (HOSPITAL_COMMUNITY): Payer: Self-pay | Admitting: Emergency Medicine

## 2014-11-24 ENCOUNTER — Emergency Department (HOSPITAL_COMMUNITY): Payer: Commercial Managed Care - HMO

## 2014-11-24 DIAGNOSIS — E119 Type 2 diabetes mellitus without complications: Secondary | ICD-10-CM | POA: Diagnosis present

## 2014-11-24 DIAGNOSIS — K7460 Unspecified cirrhosis of liver: Secondary | ICD-10-CM | POA: Diagnosis present

## 2014-11-24 DIAGNOSIS — C259 Malignant neoplasm of pancreas, unspecified: Secondary | ICD-10-CM | POA: Diagnosis present

## 2014-11-24 DIAGNOSIS — Z79899 Other long term (current) drug therapy: Secondary | ICD-10-CM | POA: Diagnosis not present

## 2014-11-24 DIAGNOSIS — Z87442 Personal history of urinary calculi: Secondary | ICD-10-CM

## 2014-11-24 DIAGNOSIS — R188 Other ascites: Secondary | ICD-10-CM | POA: Diagnosis present

## 2014-11-24 DIAGNOSIS — R1013 Epigastric pain: Secondary | ICD-10-CM | POA: Diagnosis present

## 2014-11-24 DIAGNOSIS — C220 Liver cell carcinoma: Secondary | ICD-10-CM | POA: Diagnosis present

## 2014-11-24 DIAGNOSIS — K219 Gastro-esophageal reflux disease without esophagitis: Secondary | ICD-10-CM | POA: Diagnosis present

## 2014-11-24 DIAGNOSIS — N390 Urinary tract infection, site not specified: Secondary | ICD-10-CM | POA: Diagnosis present

## 2014-11-24 DIAGNOSIS — K859 Acute pancreatitis, unspecified: Secondary | ICD-10-CM | POA: Diagnosis present

## 2014-11-24 DIAGNOSIS — K851 Biliary acute pancreatitis without necrosis or infection: Secondary | ICD-10-CM | POA: Diagnosis present

## 2014-11-24 DIAGNOSIS — F329 Major depressive disorder, single episode, unspecified: Secondary | ICD-10-CM | POA: Diagnosis present

## 2014-11-24 DIAGNOSIS — K5900 Constipation, unspecified: Secondary | ICD-10-CM | POA: Diagnosis not present

## 2014-11-24 DIAGNOSIS — E8770 Fluid overload, unspecified: Secondary | ICD-10-CM | POA: Diagnosis present

## 2014-11-24 DIAGNOSIS — E871 Hypo-osmolality and hyponatremia: Secondary | ICD-10-CM | POA: Diagnosis not present

## 2014-11-24 DIAGNOSIS — E872 Acidosis: Secondary | ICD-10-CM | POA: Diagnosis present

## 2014-11-24 DIAGNOSIS — Z66 Do not resuscitate: Secondary | ICD-10-CM | POA: Diagnosis present

## 2014-11-24 DIAGNOSIS — F17210 Nicotine dependence, cigarettes, uncomplicated: Secondary | ICD-10-CM | POA: Diagnosis present

## 2014-11-24 DIAGNOSIS — Z7982 Long term (current) use of aspirin: Secondary | ICD-10-CM

## 2014-11-24 DIAGNOSIS — Z880 Allergy status to penicillin: Secondary | ICD-10-CM

## 2014-11-24 DIAGNOSIS — R0602 Shortness of breath: Secondary | ICD-10-CM

## 2014-11-24 DIAGNOSIS — C3490 Malignant neoplasm of unspecified part of unspecified bronchus or lung: Secondary | ICD-10-CM | POA: Diagnosis present

## 2014-11-24 DIAGNOSIS — Z9221 Personal history of antineoplastic chemotherapy: Secondary | ICD-10-CM

## 2014-11-24 DIAGNOSIS — F419 Anxiety disorder, unspecified: Secondary | ICD-10-CM | POA: Diagnosis present

## 2014-11-24 DIAGNOSIS — K858 Other acute pancreatitis without necrosis or infection: Secondary | ICD-10-CM

## 2014-11-24 DIAGNOSIS — Z8673 Personal history of transient ischemic attack (TIA), and cerebral infarction without residual deficits: Secondary | ICD-10-CM

## 2014-11-24 DIAGNOSIS — K838 Other specified diseases of biliary tract: Secondary | ICD-10-CM | POA: Diagnosis not present

## 2014-11-24 DIAGNOSIS — R1011 Right upper quadrant pain: Secondary | ICD-10-CM | POA: Diagnosis present

## 2014-11-24 DIAGNOSIS — Z833 Family history of diabetes mellitus: Secondary | ICD-10-CM

## 2014-11-24 DIAGNOSIS — F418 Other specified anxiety disorders: Secondary | ICD-10-CM | POA: Diagnosis not present

## 2014-11-24 DIAGNOSIS — F17200 Nicotine dependence, unspecified, uncomplicated: Secondary | ICD-10-CM | POA: Diagnosis present

## 2014-11-24 DIAGNOSIS — K831 Obstruction of bile duct: Principal | ICD-10-CM | POA: Diagnosis present

## 2014-11-24 DIAGNOSIS — G250 Essential tremor: Secondary | ICD-10-CM | POA: Diagnosis present

## 2014-11-24 DIAGNOSIS — G252 Other specified forms of tremor: Secondary | ICD-10-CM

## 2014-11-24 LAB — URINALYSIS, ROUTINE W REFLEX MICROSCOPIC
Glucose, UA: >1000 mg/dL — AB
Hgb urine dipstick: NEGATIVE
Ketones, ur: 15 mg/dL — AB
Nitrite: POSITIVE — AB
Protein, ur: 30 mg/dL — AB
SPECIFIC GRAVITY, URINE: 1.028 (ref 1.005–1.030)
Urobilinogen, UA: 1 mg/dL (ref 0.0–1.0)
pH: 5.5 (ref 5.0–8.0)

## 2014-11-24 LAB — PROTIME-INR
INR: 1.07 (ref 0.00–1.49)
Prothrombin Time: 14.1 seconds (ref 11.6–15.2)

## 2014-11-24 LAB — CBC WITH DIFFERENTIAL/PLATELET
BASOS PCT: 0 % (ref 0–1)
Basophils Absolute: 0 10*3/uL (ref 0.0–0.1)
EOS ABS: 0.1 10*3/uL (ref 0.0–0.7)
Eosinophils Relative: 0 % (ref 0–5)
HEMATOCRIT: 37.3 % — AB (ref 39.0–52.0)
Hemoglobin: 13.5 g/dL (ref 13.0–17.0)
LYMPHS ABS: 1.3 10*3/uL (ref 0.7–4.0)
Lymphocytes Relative: 11 % — ABNORMAL LOW (ref 12–46)
MCH: 32.4 pg (ref 26.0–34.0)
MCHC: 36.2 g/dL — ABNORMAL HIGH (ref 30.0–36.0)
MCV: 89.4 fL (ref 78.0–100.0)
Monocytes Absolute: 1.6 10*3/uL — ABNORMAL HIGH (ref 0.1–1.0)
Monocytes Relative: 13 % — ABNORMAL HIGH (ref 3–12)
Neutro Abs: 9.1 10*3/uL — ABNORMAL HIGH (ref 1.7–7.7)
Neutrophils Relative %: 76 % (ref 43–77)
Platelets: 214 10*3/uL (ref 150–400)
RBC: 4.17 MIL/uL — AB (ref 4.22–5.81)
RDW: 14.4 % (ref 11.5–15.5)
WBC: 12 10*3/uL — ABNORMAL HIGH (ref 4.0–10.5)

## 2014-11-24 LAB — COMPREHENSIVE METABOLIC PANEL
ALK PHOS: 319 U/L — AB (ref 38–126)
ALT: 209 U/L — AB (ref 17–63)
AST: 81 U/L — ABNORMAL HIGH (ref 15–41)
Albumin: 3.2 g/dL — ABNORMAL LOW (ref 3.5–5.0)
Anion gap: 7 (ref 5–15)
BILIRUBIN TOTAL: 6.4 mg/dL — AB (ref 0.3–1.2)
BUN: 15 mg/dL (ref 6–20)
CO2: 24 mmol/L (ref 22–32)
CREATININE: 0.88 mg/dL (ref 0.61–1.24)
Calcium: 8.1 mg/dL — ABNORMAL LOW (ref 8.9–10.3)
Chloride: 93 mmol/L — ABNORMAL LOW (ref 101–111)
GFR calc Af Amer: >60 mL/min (ref >60)
Glucose, Bld: 243 mg/dL — ABNORMAL HIGH (ref 65–99)
POTASSIUM: 4 mmol/L (ref 3.5–5.1)
SODIUM: 124 mmol/L — AB (ref 135–145)
TOTAL PROTEIN: 6.7 g/dL (ref 6.5–8.1)

## 2014-11-24 LAB — GLUCOSE, CAPILLARY: Glucose-Capillary: 130 mg/dL — ABNORMAL HIGH (ref 65–99)

## 2014-11-24 LAB — URINE MICROSCOPIC-ADD ON

## 2014-11-24 LAB — I-STAT CG4 LACTIC ACID, ED: Lactic Acid, Venous: 1.78 mmol/L (ref 0.5–2.0)

## 2014-11-24 LAB — LIPASE, BLOOD: Lipase: 184 U/L — ABNORMAL HIGH (ref 22–51)

## 2014-11-24 MED ORDER — ALBUTEROL SULFATE (2.5 MG/3ML) 0.083% IN NEBU: 2.5000 mg | INHALATION_SOLUTION | RESPIRATORY_TRACT | Status: DC | PRN

## 2014-11-24 MED ORDER — ALBUTEROL SULFATE (2.5 MG/3ML) 0.083% IN NEBU
2.5000 mg | INHALATION_SOLUTION | Freq: Four times a day (QID) | RESPIRATORY_TRACT | Status: DC
  Administered 2014-11-24: 2.5 mg via RESPIRATORY_TRACT
  Filled 2014-11-24: qty 3

## 2014-11-24 MED ORDER — NICOTINE 14 MG/24HR TD PT24
14.0000 mg | MEDICATED_PATCH | Freq: Every day | TRANSDERMAL | Status: DC
  Administered 2014-11-25 – 2014-11-30 (×6): 14 mg via TRANSDERMAL
  Filled 2014-11-24 (×6): qty 1

## 2014-11-24 MED ORDER — SODIUM CHLORIDE 0.9 % IV SOLN
INTRAVENOUS | Status: DC
  Administered 2014-11-24 – 2014-11-30 (×6): via INTRAVENOUS

## 2014-11-24 MED ORDER — NAPHAZOLINE-PHENIRAMINE 0.025-0.3 % OP SOLN
1.0000 [drp] | Freq: Every day | OPHTHALMIC | Status: DC | PRN
  Filled 2014-11-24: qty 5

## 2014-11-24 MED ORDER — ONDANSETRON HCL 4 MG/2ML IJ SOLN: 4.0000 mg | Freq: Four times a day (QID) | INTRAMUSCULAR | Status: DC | PRN

## 2014-11-24 MED ORDER — SODIUM CHLORIDE 0.9 % IJ SOLN
3.0000 mL | Freq: Two times a day (BID) | INTRAMUSCULAR | Status: DC
  Administered 2014-11-24 – 2014-11-29 (×4): 3 mL via INTRAVENOUS

## 2014-11-24 MED ORDER — GUAIFENESIN-DM 100-10 MG/5ML PO SYRP: 5.0000 mL | ORAL_SOLUTION | ORAL | Status: DC | PRN

## 2014-11-24 MED ORDER — IOHEXOL 300 MG/ML  SOLN
50.0000 mL | Freq: Once | INTRAMUSCULAR | Status: AC | PRN
  Administered 2014-11-24: 50 mL via ORAL

## 2014-11-24 MED ORDER — ACETAMINOPHEN 650 MG RE SUPP: 650.0000 mg | Freq: Four times a day (QID) | RECTAL | Status: DC | PRN

## 2014-11-24 MED ORDER — IOHEXOL 300 MG/ML  SOLN
100.0000 mL | Freq: Once | INTRAMUSCULAR | Status: AC | PRN
  Administered 2014-11-24: 100 mL via INTRAVENOUS

## 2014-11-24 MED ORDER — SALINE SPRAY 0.65 % NA SOLN
1.0000 | Freq: Every evening | NASAL | Status: DC | PRN
  Filled 2014-11-24: qty 44

## 2014-11-24 MED ORDER — ONDANSETRON HCL 4 MG PO TABS: 4.0000 mg | ORAL_TABLET | Freq: Four times a day (QID) | ORAL | Status: DC | PRN

## 2014-11-24 MED ORDER — NAPHAZOLINE-GLYCERIN 0.012-0.25 % OP SOLN: 1.0000 [drp] | Freq: Every day | OPHTHALMIC | Status: DC | PRN

## 2014-11-24 MED ORDER — ACETAMINOPHEN 325 MG PO TABS: 650.0000 mg | ORAL_TABLET | Freq: Four times a day (QID) | ORAL | Status: DC | PRN

## 2014-11-24 MED ORDER — INSULIN ASPART 100 UNIT/ML ~~LOC~~ SOLN
0.0000 [IU] | SUBCUTANEOUS | Status: DC
  Administered 2014-11-24 – 2014-11-26 (×3): 1 [IU] via SUBCUTANEOUS
  Administered 2014-11-26 – 2014-11-27 (×2): 2 [IU] via SUBCUTANEOUS
  Administered 2014-11-28: 3 [IU] via SUBCUTANEOUS
  Administered 2014-11-28: 1 [IU] via SUBCUTANEOUS
  Administered 2014-11-29 – 2014-11-30 (×2): 2 [IU] via SUBCUTANEOUS

## 2014-11-24 MED ORDER — HYDROMORPHONE HCL 1 MG/ML IJ SOLN
1.0000 mg | INTRAMUSCULAR | Status: DC | PRN
  Administered 2014-11-24 – 2014-11-29 (×16): 1 mg via INTRAVENOUS
  Filled 2014-11-24 (×16): qty 1

## 2014-11-24 MED ORDER — ALBUTEROL SULFATE HFA 108 (90 BASE) MCG/ACT IN AERS: 1.0000 | INHALATION_SPRAY | RESPIRATORY_TRACT | Status: DC | PRN

## 2014-11-24 NOTE — ED Notes (Signed)
Delay in transfer waiting on portable chest

## 2014-11-24 NOTE — ED Notes (Signed)
Patient transported to CT 
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
## 2014-11-24 NOTE — ED Notes (Signed)
Pt A+Ox4, reports "hurting all over", vague, poor historian, eventually reports pain to R side abdomen "but it's really all over", and into R flank.  Pt reports "micro surgery on my liver" x1 mo ago.  Pt reports nausea, occ vomiting, feeling constipated.  Pt denies fevers/chills. Skin PWD.  MAEI, ambulatory with steady gait.  NAD.

## 2014-11-24 NOTE — Progress Notes (Signed)
Respiratory Protocol Assessment performed on Pt.  Pt scored a 5 which is severity score 0.  Pt states he only has a CombiVent rescue inhaler at home and has not been needing to use it at all the previous few days.  Albuterol q6hours D/C'd and albuterol q2h PRN remains. Explained to Pt that he should call should he need a breathing treatment for any reason.  RT will continue to monitor as needed.

## 2014-11-25 DIAGNOSIS — F418 Other specified anxiety disorders: Secondary | ICD-10-CM

## 2014-11-25 DIAGNOSIS — K838 Other specified diseases of biliary tract: Secondary | ICD-10-CM

## 2014-11-25 LAB — COMPREHENSIVE METABOLIC PANEL
ALK PHOS: 318 U/L — AB (ref 38–126)
ALT: 155 U/L — ABNORMAL HIGH (ref 17–63)
ANION GAP: 8 (ref 5–15)
AST: 62 U/L — ABNORMAL HIGH (ref 15–41)
Albumin: 2.7 g/dL — ABNORMAL LOW (ref 3.5–5.0)
BUN: 12 mg/dL (ref 6–20)
CALCIUM: 8.2 mg/dL — AB (ref 8.9–10.3)
CO2: 25 mmol/L (ref 22–32)
Chloride: 96 mmol/L — ABNORMAL LOW (ref 101–111)
Creatinine, Ser: 0.84 mg/dL (ref 0.61–1.24)
GFR calc Af Amer: >60 mL/min (ref >60)
Glucose, Bld: 115 mg/dL — ABNORMAL HIGH (ref 65–99)
Potassium: 4 mmol/L (ref 3.5–5.1)
SODIUM: 129 mmol/L — AB (ref 135–145)
Total Bilirubin: 7.1 mg/dL — ABNORMAL HIGH (ref 0.3–1.2)
Total Protein: 6.1 g/dL — ABNORMAL LOW (ref 6.5–8.1)

## 2014-11-25 LAB — GLUCOSE, CAPILLARY
GLUCOSE-CAPILLARY: 103 mg/dL — AB (ref 65–99)
GLUCOSE-CAPILLARY: 107 mg/dL — AB (ref 65–99)
GLUCOSE-CAPILLARY: 113 mg/dL — AB (ref 65–99)
GLUCOSE-CAPILLARY: 160 mg/dL — AB (ref 65–99)
Glucose-Capillary: 106 mg/dL — ABNORMAL HIGH (ref 65–99)
Glucose-Capillary: 142 mg/dL — ABNORMAL HIGH (ref 65–99)

## 2014-11-25 LAB — CBC
HEMATOCRIT: 36.2 % — AB (ref 39.0–52.0)
HEMOGLOBIN: 12.6 g/dL — AB (ref 13.0–17.0)
MCH: 31.2 pg (ref 26.0–34.0)
MCHC: 34.8 g/dL (ref 30.0–36.0)
MCV: 89.6 fL (ref 78.0–100.0)
PLATELETS: 216 10*3/uL (ref 150–400)
RBC: 4.04 MIL/uL — AB (ref 4.22–5.81)
RDW: 14.6 % (ref 11.5–15.5)
WBC: 9.2 10*3/uL (ref 4.0–10.5)

## 2014-11-25 LAB — OSMOLALITY, URINE: Osmolality, Ur: 301 mOsm/kg — ABNORMAL LOW (ref 390–1090)

## 2014-11-25 LAB — SODIUM, URINE, RANDOM: SODIUM UR: 28 mmol/L

## 2014-11-25 NOTE — Progress Notes (Signed)
Austin [name] WSF:[id number] DOB: 27-Jun-1933 DOA: 11/24/2014 PCP: [name],[name], MD  Brief narrative: 81 y/o NSCLC s/p carboplatin/Pacltaxel last dose 01/2012 on observation,  Cirrhosis with [place] diagnosed 08/2014-s/p microwave ablation per IR 10/20/14-MRI in 09/2014 =hypo-vascular lesion in the head of pancreas associated with marked narrowing of the distal common bile duct (without frank obstruction was apparent and slight progressive worsening of the pancreatic ductal dilatation. It was commented that they could be a slow-growing pancreatic neoplasm and recommended for a nonemergent ERCP CVA 1980's [name] 2000 -still smoker dm ty ii LAp Chole ~2009  Admit 11/24/14 3/7 h/o RUQ pain, -->back, n/v.  +bilious vomit Noted to have Obstructive jaundine/Pancreatitis/hyponatremia   Past medical history-As per Problem list Chart reviewed as below-   Consultants:  Oncology  GI  Procedures:    Antibiotics:     Subjective   Fair drinking coffee, soup, ice cream No cp Feels swollen Has been OOB since Chemo for NSCLC No cp No sputum No fever   Objective    Interim History:   Telemetry:    Objective: Filed Vitals:   11/24/14 2018 11/24/14 2034 11/25/14 0416 11/25/14 1424  BP: 138/71  139/65 115/68  Pulse: 96  92 96  Temp: 99.7 F (37.6 C)  98.9 F (37.2 C) 98.1 F (36.7 C)  TempSrc: Oral  Oral Oral  Resp: '20  18 18  '$ Height:      Weight:      SpO2: 98% 98% 90% 96%    Intake/Output Summary (Last 24 hours) at 11/25/14 1551 Last data filed at 11/25/14 1425  Gross per 24 hour  Intake 1218.33 ml  Output   1250 ml  Net -31.67 ml    Exam:  General: eomi ncat-Icteric ++.  Poor visual acuity Cardiovascular:  s1 s 2no m/r/g Respiratory: clear no added sound Abdomen: distended + Shifting dullness Skin icteric Neurono flap nor tremor  Data Reviewed: Basic Metabolic Panel:  Recent Labs Lab 11/24/14 1309 11/25/14 0503  NA 124* 129*  K 4.0  4.0  CL 93* 96*  CO2 24 25  GLUCOSE 243* 115*  BUN 15 12  CREATININE 0.88 0.84  CALCIUM 8.1* 8.2*   Liver Function Tests:  Recent Labs Lab 11/24/14 1309 11/25/14 0503  AST 81* 62*  ALT 209* 155*  ALKPHOS 319* 318*  BILITOT 6.4* 7.1*  PROT 6.7 6.1*  ALBUMIN 3.2* 2.7*    Recent Labs Lab 11/24/14 1309  LIPASE 184*   No results for input(s): AMMONIA in the last 168 hours. CBC:  Recent Labs Lab 11/24/14 1309 11/25/14 0503  WBC 12.0* 9.2  NEUTROABS 9.1*  --   HGB 13.5 12.6*  HCT 37.3* 36.2*  MCV 89.4 89.6  PLT 214 216   Cardiac Enzymes: No results for input(s): CKTOTAL, CKMB, CKMBINDEX, TROPONINI in the last 168 hours. BNP: Invalid input(s): POCBNP CBG:  Recent Labs Lab 11/24/14 2015 11/25/14 0028 11/25/14 0413 11/25/14 0749 11/25/14 1058  GLUCAP 130* 113* 106* 107* 103*    Recent Results (from the past 240 hour(s))  Urine culture     Status: None (Preliminary result)   Collection Time: 11/24/14  3:12 PM  Result Value Ref Range Status   Specimen Description URINE, CLEAN CATCH  Final   Special Requests NONE  Final   Culture   Final    NO GROWTH < 24 HOURS Performed at [facility]    Report Status PENDING  Incomplete     Studies:  All Imaging reviewed and is as per above notation   Scheduled Meds: . insulin aspart  0-9 Units Subcutaneous 6 times per day  . nicotine  14 mg Transdermal Daily  . sodium chloride  3 mL Intravenous Q12H   Continuous Infusions: . sodium chloride 100 mL/hr at 11/25/14 0419     Assessment/Plan:  Principal Problem:    Obstructive jaundice, Bilirubin ~7 on admit-previously 0.49 -? New onset malignancy vs local spread HCC?  - defer to gi - cut back IVF 100-->50 cc/hr -ERCP soon as pancreatitis resolves -Likely should involve Hospice vs Oncology moving forward    Biliary acute pancreatitis -2/2 to de novo pancreatic malignancy? -Await ca 19-9 -clear liquids only for now, but is tolerating  well  Hypervolemic hyponatremia 2/2 to cirrhosis -Urine OSM=301/Serum OSM [calc]=257 -2/2 to Ascites physiology 2/2 to Renin-Angiotensin dysregulation -replace IV saline 100-->50 cc/hr as trying to correct Sodium, but iv volume given in excess, would third-space -consider Albumin -Unclear if any role diuretics-defer to GI -Sodium 124--->129  Mild Lactic acidosis -monitor -no source infection-this is probably all cancer related    TOBACCO DEPENDENCE -nicotine patch 14 mg -unlikely to quit    TREMOR, ESSENTIAL -monitor    Squamous cell lung cancer -Will alert Dr. 

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
## 2014-11-26 LAB — CBC WITH DIFFERENTIAL/PLATELET
BASOS ABS: 0 10*3/uL (ref 0.0–0.1)
Basophils Relative: 0 % (ref 0–1)
Eosinophils Absolute: 0.2 10*3/uL (ref 0.0–0.7)
Eosinophils Relative: 3 % (ref 0–5)
HEMATOCRIT: 35.8 % — AB (ref 39.0–52.0)
Hemoglobin: 12.5 g/dL — ABNORMAL LOW (ref 13.0–17.0)
LYMPHS ABS: 1.6 10*3/uL (ref 0.7–4.0)
LYMPHS PCT: 20 % (ref 12–46)
MCH: 31.5 pg (ref 26.0–34.0)
MCHC: 34.9 g/dL (ref 30.0–36.0)
MCV: 90.2 fL (ref 78.0–100.0)
MONO ABS: 1.5 10*3/uL — AB (ref 0.1–1.0)
Monocytes Relative: 19 % — ABNORMAL HIGH (ref 3–12)
NEUTROS PCT: 58 % (ref 43–77)
Neutro Abs: 4.6 10*3/uL (ref 1.7–7.7)
PLATELETS: 234 10*3/uL (ref 150–400)
RBC: 3.97 MIL/uL — ABNORMAL LOW (ref 4.22–5.81)
RDW: 14.8 % (ref 11.5–15.5)
WBC: 7.9 10*3/uL (ref 4.0–10.5)

## 2014-11-26 LAB — GLUCOSE, CAPILLARY
GLUCOSE-CAPILLARY: 151 mg/dL — AB (ref 65–99)
GLUCOSE-CAPILLARY: 93 mg/dL (ref 65–99)
Glucose-Capillary: 82 mg/dL (ref 65–99)
Glucose-Capillary: 77 mg/dL (ref 65–99)
Glucose-Capillary: 137 mg/dL — ABNORMAL HIGH (ref 65–99)
Glucose-Capillary: 91 mg/dL (ref 65–99)

## 2014-11-26 LAB — COMPREHENSIVE METABOLIC PANEL
ALBUMIN: 2.7 g/dL — AB (ref 3.5–5.0)
ALK PHOS: 347 U/L — AB (ref 38–126)
ALT: 128 U/L — AB (ref 17–63)
AST: 61 U/L — AB (ref 15–41)
Anion gap: 9 (ref 5–15)
BILIRUBIN TOTAL: 8.2 mg/dL — AB (ref 0.3–1.2)
BUN: 10 mg/dL (ref 6–20)
CALCIUM: 8.3 mg/dL — AB (ref 8.9–10.3)
CO2: 25 mmol/L (ref 22–32)
CREATININE: 0.57 mg/dL — AB (ref 0.61–1.24)
Chloride: 96 mmol/L — ABNORMAL LOW (ref 101–111)
GFR calc non Af Amer: >60 mL/min (ref >60)
GLUCOSE: 97 mg/dL (ref 65–99)
Potassium: 4 mmol/L (ref 3.5–5.1)
Sodium: 130 mmol/L — ABNORMAL LOW (ref 135–145)
TOTAL PROTEIN: 6.2 g/dL — AB (ref 6.5–8.1)

## 2014-11-26 LAB — PROTIME-INR
INR: 1.06 (ref 0.00–1.49)
Prothrombin Time: 14 seconds (ref 11.6–15.2)

## 2014-11-26 LAB — URINE CULTURE

## 2014-11-26 LAB — LIPASE, BLOOD: Lipase: 66 U/L — ABNORMAL HIGH (ref 22–51)

## 2014-11-26 MED ORDER — CHOLESTYRAMINE LIGHT 4 G PO PACK
4.0000 g | PACK | Freq: Two times a day (BID) | ORAL | Status: DC
  Administered 2014-11-27 – 2014-11-30 (×7): 4 g via ORAL
  Filled 2014-11-26 (×8): qty 1

## 2014-11-26 MED ORDER — DIPHENHYDRAMINE HCL 25 MG PO CAPS
25.0000 mg | ORAL_CAPSULE | Freq: Three times a day (TID) | ORAL | Status: DC | PRN
  Administered 2014-11-26 – 2014-11-29 (×5): 25 mg via ORAL
  Filled 2014-11-26 (×5): qty 1
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
## 2014-11-26 NOTE — Progress Notes (Signed)
Austin [name] KYH:[id number] DOB: Feb 23, 1934 DOA: 11/24/2014 PCP: [name],[name], MD  Brief narrative: 81 y/o NSCLC s/p carboplatin/Pacltaxel last dose 01/2012 on observation,  Cirrhosis with [place] diagnosed 08/2014-s/p microwave ablation per IR 10/20/14-MRI in 09/2014 =hypo-vascular lesion in the head of pancreas associated with marked narrowing of the distal common bile duct (without frank obstruction was apparent and slight progressive worsening of the pancreatic ductal dilatation. It was commented that they could be a slow-growing pancreatic neoplasm and recommended for a nonemergent ERCP CVA 1980's [name] 2000 -still smoker dm ty ii LAp Chole ~2009  Admit 11/24/14 3/7 h/o RUQ pain, -->back, n/v.  +bilious vomit Noted to have Obstructive jaundine/Pancreatitis/hyponatremia ?ERCP Dr Benson Norway 11/27/14  Past medical history-As per Problem list Chart reviewed as below-   Consultants:  Oncology  GI  Procedures:    Antibiotics:     Subjective   Fair  No other issues  wodnering when he will have procedure No cp No n No stool no fever nor chills    Objective    Interim History:   Telemetry:    Objective: Filed Vitals:   11/25/14 1424 11/25/14 2029 11/26/14 0421 11/26/14 1410  BP: 115/68 143/65 130/66 128/92  Pulse: 96 100 85 102  Temp: 98.1 F (36.7 C) 98.1 F (36.7 C) 97.8 F (36.6 C) 97.8 F (36.6 C)  TempSrc: Oral Oral Oral Oral  Resp: '18 20 18 18  '$ Height:      Weight:      SpO2: 96% 100% 98% 90%    Intake/Output Summary (Last 24 hours) at 11/26/14 1439 Last data filed at 11/26/14 1406  Gross per 24 hour  Intake 2192.5 ml  Output    200 ml  Net 1992.5 ml    Exam:  General: eomi ncat-Icteric ++.  Poor visual acuity Cardiovascular:  s1 s 2no m/r/g Respiratory: clear no added sound Abdomen: distended + Shifting dullness Skin icteric Neurono flap nor tremor  Data Reviewed: Basic Metabolic Panel:  Recent Labs Lab 11/24/14 1309  11/25/14 0503 11/26/14 0532  NA 124* 129* 130*  K 4.0 4.0 4.0  CL 93* 96* 96*  CO2 '24 25 25  '$ GLUCOSE 243* 115* 97  BUN '15 12 10  '$ CREATININE 0.88 0.84 0.57*  CALCIUM 8.1* 8.2* 8.3*   Liver Function Tests:  Recent Labs Lab 11/24/14 1309 11/25/14 0503 11/26/14 0532  AST 81* 62* 61*  ALT 209* 155* 128*  ALKPHOS 319* 318* 347*  BILITOT 6.4* 7.1* 8.2*  PROT 6.7 6.1* 6.2*  ALBUMIN 3.2* 2.7* 2.7*    Recent Labs Lab 11/24/14 1309 11/26/14 0532  LIPASE 184* 66*   No results for input(s): AMMONIA in the last 168 hours. CBC:  Recent Labs Lab 11/24/14 1309 11/25/14 0503 11/26/14 0532  WBC 12.0* 9.2 7.9  NEUTROABS 9.1*  --  4.6  HGB 13.5 12.6* 12.5*  HCT 37.3* 36.2* 35.8*  MCV 89.4 89.6 90.2  PLT 214 216 234   Cardiac Enzymes: No results for input(s): CKTOTAL, CKMB, CKMBINDEX, TROPONINI in the last 168 hours. BNP: Invalid input(s): POCBNP CBG:  Recent Labs Lab 11/25/14 2026 11/26/14 0010 11/26/14 0417 11/26/14 0733 11/26/14 1142  GLUCAP 142* 93 82 77 137*    Recent Results (from the past 240 hour(s))  Urine culture     Status: None   Collection Time: 11/24/14  3:12 PM  Result Value Ref Range Status   Specimen Description URINE, CLEAN CATCH  Final   Special Requests NONE  Final   Culture  Final    MULTIPLE SPECIES PRESENT, SUGGEST RECOLLECTION Performed at [facility]    Report Status 11/26/2014 FINAL  Final     Studies:              All Imaging reviewed and is as per above notation   Scheduled Meds: . insulin aspart  0-9 Units Subcutaneous 6 times per day  . nicotine  14 mg Transdermal Daily  . sodium chloride  3 mL Intravenous Q12H   Continuous Infusions: . sodium chloride 50 mL/hr at 11/26/14 0217     Assessment/Plan:  Principal Problem:    Obstructive jaundice, Bilirubin ~7 -->8.2 previously 0.49 -? New onset malignancy vs local spread HCC?  - defer to gi - cut back IVF 100-->50 cc/hr -ERCP ? 8/7-npo for procedure     Biliary acute pancreatitis -2/2 to de novo pancreatic malignancy? -Await ca 19-9 from 11/25/14 -clear liquids only for now  Hypervolemic hyponatremia 2/2 to cirrhosis -Urine OSM=301/Serum OSM [calc]=257 -2/2 to Ascites physiology 2/2 to Renin-Angiotensin dysregulation -replace IV saline 100-->50 cc/hr as trying to correct Sodium, but iv volume given in excess, would third-space -consider Albumin -Unclear if any role diuretics-defer to GI -Sodium 124--->129-->130  Mild Lactic acidosis -monitor -no source infection-this is probably all cancer related    TOBACCO DEPENDENCE -nicotine patch 14 mg -unlikely to quit     TREMOR, ESSENTIAL -monitor    Squamous cell lung cancer -Will alert Dr. 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

## 2014-11-27 ENCOUNTER — Ambulatory Visit: Payer: Commercial Managed Care - HMO | Admitting: Family Medicine

## 2014-11-27 LAB — COMPREHENSIVE METABOLIC PANEL
ALT: 113 U/L — AB (ref 17–63)
AST: 77 U/L — AB (ref 15–41)
Albumin: 2.7 g/dL — ABNORMAL LOW (ref 3.5–5.0)
Alkaline Phosphatase: 366 U/L — ABNORMAL HIGH (ref 38–126)
Anion gap: 8 (ref 5–15)
BILIRUBIN TOTAL: 9.4 mg/dL — AB (ref 0.3–1.2)
BUN: 9 mg/dL (ref 6–20)
CO2: 25 mmol/L (ref 22–32)
Calcium: 8.5 mg/dL — ABNORMAL LOW (ref 8.9–10.3)
Chloride: 98 mmol/L — ABNORMAL LOW (ref 101–111)
Creatinine, Ser: 0.61 mg/dL (ref 0.61–1.24)
GFR calc non Af Amer: >60 mL/min (ref >60)
Glucose, Bld: 103 mg/dL — ABNORMAL HIGH (ref 65–99)
POTASSIUM: 4.2 mmol/L (ref 3.5–5.1)
Sodium: 131 mmol/L — ABNORMAL LOW (ref 135–145)
TOTAL PROTEIN: 6.3 g/dL — AB (ref 6.5–8.1)

## 2014-11-27 LAB — GLUCOSE, CAPILLARY
GLUCOSE-CAPILLARY: 164 mg/dL — AB (ref 65–99)
GLUCOSE-CAPILLARY: 100 mg/dL — AB (ref 65–99)
Glucose-Capillary: 101 mg/dL — ABNORMAL HIGH (ref 65–99)
Glucose-Capillary: 85 mg/dL (ref 65–99)
Glucose-Capillary: 90 mg/dL (ref 65–99)
Glucose-Capillary: 96 mg/dL (ref 65–99)

## 2014-11-27 MED ORDER — SODIUM CHLORIDE 0.9 % IV SOLN: INTRAVENOUS | Status: DC

## 2014-11-27 NOTE — Progress Notes (Signed)
PT Cancellation Note / Screen  Patient Details Name: Austin [name] MRN: [id number] DOB: [birth year]   Cancelled Treatment:    Reason Eval/Treat Not Completed: PT screened, no needs identified, will sign off Pt politely declines need for PT at this time. Pt states he ambulates fine and plans to have surgery tomorrow and then go home.  Pt does not anticipate need for PT even after surgery so will sign off at this time.   Please reorder if new needs arise.  Thanks.   Austin [name],KATHrine E 11/27/2014, 1:48 PM Carmelia Bake, PT, DPT 11/27/2014 Pager: [phone]

## 2014-11-27 NOTE — Progress Notes (Signed)
Austin [name] [id number]:[id number] DOB: 24-Mar-1934 DOA: 11/24/2014 PCP: No primary care provider on file.  Brief narrative:  81 y/o NSCLC s/p carboplatin/Pacltaxel last dose 01/2012 on observation,  Cirrhosis with [place] diagnosed 08/2014-s/p microwave ablation per IR 10/20/14-MRI in 09/2014 =hypo-vascular lesion in the head of pancreas associated with marked narrowing of the distal common bile duct (without frank obstruction was apparent and slight progressive worsening of the pancreatic ductal dilatation. It was commented that they could be a slow-growing pancreatic neoplasm and recommended for a nonemergent ERCP CVA 1980's [name] 2000 -still smoker dm ty ii LAp Chole ~2009  Admit 11/24/14 3/7 h/o RUQ pain, -->back, n/v.  +bilious vomit Noted to have Obstructive jaundine/Pancreatitis/hyponatremia ?ERCP Dr Benson Norway 11/27/14  Past medical history-As per Problem list Chart reviewed as below-   Consultants:  Oncology  GI  Procedures:    Antibiotics:     Subjective   Fair  Wants to eat tol clear No cp   Objective    Interim History:   Telemetry:    Objective: Filed Vitals:   11/26/14 1410 11/26/14 2013 11/27/14 0424 11/27/14 1415  BP: 128/92 142/74 134/75 142/118  Pulse: 102 104 108 71  Temp: 97.8 F (36.6 C) 98.9 F (37.2 C) 98.4 F (36.9 C) 98 F (36.7 C)  TempSrc: Oral Oral Oral Oral  Resp: '18 20 18 16  '$ Height:      Weight:      SpO2: 90% 93% 100% 100%    Intake/Output Summary (Last 24 hours) at 11/27/14 1813 Last data filed at 11/27/14 1730  Gross per 24 hour  Intake 2780.83 ml  Output   1525 ml  Net 1255.83 ml    Exam:  General: eomi ncat-Icteric ++.  Poor visual acuity Cardiovascular:  s1 s 2no m/r/g Respiratory: clear no added sound Abdomen: distended + Shifting dullness Skin icteric Neurono flap nor tremor  Data Reviewed: Basic Metabolic Panel:  Recent Labs Lab 11/24/14 1309 11/25/14 0503 11/26/14 0532 11/27/14 0510  NA 124* 129*  130* 131*  K 4.0 4.0 4.0 4.2  CL 93* 96* 96* 98*  CO2 '24 25 25 25  '$ GLUCOSE 243* 115* 97 103*  BUN '15 12 10 9  '$ CREATININE 0.88 0.84 0.57* 0.61  CALCIUM 8.1* 8.2* 8.3* 8.5*   Liver Function Tests:  Recent Labs Lab 11/24/14 1309 11/25/14 0503 11/26/14 0532 11/27/14 0510  AST 81* 62* 61* 77*  ALT 209* 155* 128* 113*  ALKPHOS 319* 318* 347* 366*  BILITOT 6.4* 7.1* 8.2* 9.4*  PROT 6.7 6.1* 6.2* 6.3*  ALBUMIN 3.2* 2.7* 2.7* 2.7*    Recent Labs Lab 11/24/14 1309 11/26/14 0532  LIPASE 184* 66*   No results for input(s): AMMONIA in the last 168 hours. CBC:  Recent Labs Lab 11/24/14 1309 11/25/14 0503 11/26/14 0532  WBC 12.0* 9.2 7.9  NEUTROABS 9.1*  --  4.6  HGB 13.5 12.6* 12.5*  HCT 37.3* 36.2* 35.8*  MCV 89.4 89.6 90.2  PLT 214 216 234   Cardiac Enzymes: No results for input(s): CKTOTAL, CKMB, CKMBINDEX, TROPONINI in the last 168 hours. BNP: Invalid input(s): POCBNP CBG:  Recent Labs Lab 11/27/14 0032 11/27/14 0417 11/27/14 0750 11/27/14 1129 11/27/14 1651  GLUCAP 85 96 90 164* 101*    Recent Results (from the past 240 hour(s))  Urine culture     Status: None   Collection Time: 11/24/14  3:12 PM  Result Value Ref Range Status   Specimen Description URINE, CLEAN CATCH  Final   Special  Requests NONE  Final   Culture   Final    MULTIPLE SPECIES PRESENT, SUGGEST RECOLLECTION Performed at [facility]    Report Status 11/26/2014 FINAL  Final     Studies:              All Imaging reviewed and is as per above notation   Scheduled Meds: . cholestyramine light  4 g Oral BID  . insulin aspart  0-9 Units Subcutaneous 6 times per day  . nicotine  14 mg Transdermal Daily  . sodium chloride  3 mL Intravenous Q12H   Continuous Infusions: . sodium chloride 50 mL/hr at 11/27/14 1754  . sodium chloride       Assessment/Plan:  Principal Problem:  Obstructive jaundice, Bilirubin ~7 -->8.2-->9.2 previously 0.49 -?New onset malignancy vs local  spread HCC?  - defer to gi - cut back IVF 100-->50 cc/hr - ERCP ? 8/8-npo psot midnight -trial Colestipol-unclear if will work with anatomic blockage    Biliary acute pancreatitis -2/2 to de novo pancreatic malignancy? -Await ca 19-9 from 11/25/14 [pending] -clear liquids only for now  Hypervolemic hyponatremia 2/2 to cirrhosis -Urine OSM=301/Serum OSM [calc]=257 -2/2 to Ascites physiology 2/2 to Renin-Angiotensin dysregulation -replace IV saline 100-->50 cc/hr as trying to correct Sodium, but iv volume given in excess, would third-space -consider Albumin -Unclear if any role diuretics-defer to GI -Sodium 124--->129-->130-->131  Mild Lactic acidosis -monitor -no source infection-this is probably all cancer related    TOBACCO DEPENDENCE -nicotine patch 14 mg -unlikely to quit    TREMOR, ESSENTIAL -monitor    Squamous cell lung cancer -Dr. 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

## 2014-11-27 NOTE — Anesthesia Preprocedure Evaluation (Addendum)
Anesthesia Evaluation  Patient identified by MRN, date of birth, ID band Patient awake    Reviewed: Allergy & Precautions, NPO status , Patient's Chart, lab work & pertinent test results  History of Anesthesia Complications Negative for: history of anesthetic complications  Airway Mallampati: II  TM Distance: >3 FB Neck ROM: Full    Dental no notable dental hx. (+) Edentulous Upper, Edentulous Lower   Pulmonary COPDCurrent Smoker,  NSLC  breath sounds clear to auscultation  + wheezing      Cardiovascular negative cardio ROS Normal cardiovascular examRhythm:Regular Rate:Normal     Neuro/Psych CVA negative psych ROS   GI/Hepatic negative GI ROS, (+) Cirrhosis -      , Lung; hepatocellular cancer 08/2014       Cancer of lung   Hepatic cirrhosis      Endo/Other  negative endocrine ROSdiabetes  Renal/GU negative Renal ROS  negative genitourinary   Musculoskeletal negative musculoskeletal ROS (+)   Abdominal   Peds negative pediatric ROS (+)  Hematology negative hematology ROS (+)   Anesthesia Other Findings   Reproductive/Obstetrics negative OB ROS                            Anesthesia Physical  Anesthesia Plan  ASA: IV  Anesthesia Plan: General   Post-op Pain Management:    Induction: Intravenous  Airway Management Planned: Oral ETT  Additional Equipment:   Intra-op Plan:   Post-operative Plan: Extubation in OR  Informed Consent: I have reviewed the patients History and Physical, chart, labs and discussed the procedure including the risks, benefits and alternatives for the proposed anesthesia with the patient or authorized representative who has indicated his/her understanding and acceptance.   Dental advisory given  Plan Discussed with: CRNA and Surgeon  Anesthesia Plan Comments:         Anesthesia Quick Evaluation

## 2014-11-27 NOTE — Care Management Note (Signed)
Case Management Note  Patient Details  Name: Austin [name] MRN: [id number] Date of Birth: June 20, 1933  Subjective/Objective:  81 y/o m admitted w/Obstructive jaundice. From home.PT cons-await recommendations.                  Action/Plan:Monitor progress for d/c needs.   Expected Discharge Date:   (unknown)               Expected Discharge Plan:  Home/Self Care  In-House Referral:     Discharge planning Services  CM Consult  Post Acute Care Choice:    Choice offered to:     DME Arranged:    DME Agency:     HH Arranged:    HH Agency:     Status of Service:  In process, will continue to follow  Medicare Important Message Given:  Yes-second notification given Date Medicare IM Given:    Medicare IM give by:    Date Additional Medicare IM Given:    Additional Medicare Important Message give by:     If discussed at [place] of Stay Meetings, dates discussed:    Additional Comments:  Dessa Phi, RN 11/27/2014, 2:02 PM

## 2014-11-27 NOTE — Care Management Important Message (Signed)
Important Message  Patient Details  Name: Austin [name] MRN: [id number] Date of Birth: [birth year]   Medicare Important Message Given:  Yes-second notification given    Camillo Flaming 11/27/2014, 12:52 [place] Message  Patient Details  Name: Austin [name] MRN: [id number] Date of Birth: 08-May-1933   Medicare Important Message Given:  Yes-second notification given    Camillo Flaming 11/27/2014, 12:51 PM

## 2014-11-27 NOTE — Progress Notes (Signed)
Subjective: Some abdominal pain.  Objective: Vital signs in last 24 hours: Temp:  [97.8 F (36.6 C)-98.9 F (37.2 C)] 98.4 F (36.9 C) (08/08 0424) Pulse Rate:  [102-108] 108 (08/08 0424) Resp:  [18-20] 18 (08/08 0424) BP: (128-142)/(74-92) 134/75 mmHg (08/08 0424) SpO2:  [90 %-100 %] 100 % (08/08 0424) Last BM Date: 11/23/14  Intake/Output from previous day: 08/07 0701 - 08/08 0700 In: 2720.8 [P.O.:1040; I.V.:1680.8] Out: 200 [Urine:200] Intake/Output this shift: Total I/O In: -  Out: 200 [Urine:200]  General appearance: alert and no distress GI: soft, non-tender; bowel sounds normal; no masses,  no organomegaly  Lab Results:  Recent Labs  11/24/14 1309 11/25/14 0503 11/26/14 0532  WBC 12.0* 9.2 7.9  HGB 13.5 12.6* 12.5*  HCT 37.3* 36.2* 35.8*  PLT 214 216 234   BMET  Recent Labs  11/25/14 0503 11/26/14 0532 11/27/14 0510  NA 129* 130* 131*  K 4.0 4.0 4.2  CL 96* 96* 98*  CO2 '25 25 25  '$ GLUCOSE 115* 97 103*  BUN '12 10 9  '$ CREATININE 0.84 0.57* 0.61  CALCIUM 8.2* 8.3* 8.5*   LFT  Recent Labs  11/27/14 0510  PROT 6.3*  ALBUMIN 2.7*  AST 77*  ALT 113*  ALKPHOS 366*  BILITOT 9.4*   PT/INR  Recent Labs  11/24/14 1734 11/26/14 0532  LABPROT 14.1 14.0  INR 1.07 1.06   Hepatitis Panel No results for input(s): HEPBSAG, HCVAB, HEPAIGM, HEPBIGM in the last 72 hours. C-Diff No results for input(s): CDIFFTOX in the last 72 hours. Fecal Lactopherrin No results for input(s): FECLLACTOFRN in the last 72 hours.  Studies/Results: No results found.  Medications:  Scheduled: . cholestyramine light  4 g Oral BID  . insulin aspart  0-9 Units Subcutaneous 6 times per day  . nicotine  14 mg Transdermal Daily  . sodium chloride  3 mL Intravenous Q12H   Continuous: . sodium chloride 50 mL/hr at 11/26/14 0217    Assessment/Plan: 1) Biliary obstruction. 2) Austin [name]. 3) Metastatic nonsmall cell carcinoma.    The patient is stable.  His TB continues  to increase.  His CBD obstruction is secondary to a malignant extrinsic compression from the head of the pancreas.  I will perform an ERCP, unfortunately, I cannot perform it today as my schedule will not permit the procedure.  Plan: 1) ERCP tomorrow. 2) Clear liquid diet today. 3) NPO after midnight.  LOS: 3 days   Austin [name] D 11/27/2014, 7:46 AM

## 2014-11-28 ENCOUNTER — Inpatient Hospital Stay (HOSPITAL_COMMUNITY): Payer: Commercial Managed Care - HMO | Admitting: Anesthesiology

## 2014-11-28 ENCOUNTER — Encounter (HOSPITAL_COMMUNITY)
Admission: EM | Disposition: A | Discharge status: Home / Self Care | Payer: Self-pay | Source: Home / Self Care | Attending: Family Medicine

## 2014-11-28 ENCOUNTER — Encounter (HOSPITAL_COMMUNITY): Payer: Self-pay

## 2014-11-28 ENCOUNTER — Inpatient Hospital Stay (HOSPITAL_COMMUNITY): Payer: Commercial Managed Care - HMO

## 2014-11-28 HISTORY — PX: ERCP: SHX5425

## 2014-11-28 LAB — COMPREHENSIVE METABOLIC PANEL
ALBUMIN: 2.8 g/dL — AB (ref 3.5–5.0)
ALK PHOS: 371 U/L — AB (ref 38–126)
ALT: 103 U/L — ABNORMAL HIGH (ref 17–63)
AST: 76 U/L — AB (ref 15–41)
Anion gap: 7 (ref 5–15)
BUN: 7 mg/dL (ref 6–20)
CALCIUM: 8.4 mg/dL — AB (ref 8.9–10.3)
CHLORIDE: 99 mmol/L — AB (ref 101–111)
CO2: 25 mmol/L (ref 22–32)
Creatinine, Ser: 0.66 mg/dL (ref 0.61–1.24)
GFR calc Af Amer: >60 mL/min (ref >60)
GFR calc non Af Amer: >60 mL/min (ref >60)
Glucose, Bld: 102 mg/dL — ABNORMAL HIGH (ref 65–99)
Potassium: 3.7 mmol/L (ref 3.5–5.1)
Sodium: 131 mmol/L — ABNORMAL LOW (ref 135–145)
Total Bilirubin: 8.9 mg/dL — ABNORMAL HIGH (ref 0.3–1.2)
Total Protein: 6.3 g/dL — ABNORMAL LOW (ref 6.5–8.1)

## 2014-11-28 LAB — GLUCOSE, CAPILLARY
GLUCOSE-CAPILLARY: 137 mg/dL — AB (ref 65–99)
GLUCOSE-CAPILLARY: 206 mg/dL — AB (ref 65–99)
Glucose-Capillary: 109 mg/dL — ABNORMAL HIGH (ref 65–99)
Glucose-Capillary: 118 mg/dL — ABNORMAL HIGH (ref 65–99)
Glucose-Capillary: 149 mg/dL — ABNORMAL HIGH (ref 65–99)
Glucose-Capillary: 91 mg/dL (ref 65–99)
Glucose-Capillary: 94 mg/dL (ref 65–99)

## 2014-11-28 LAB — CANCER ANTIGEN 19-9: CA 19 9: 828 U/mL — AB (ref 0–35)

## 2014-11-28 SURGERY — ERCP, WITH INTERVENTION IF INDICATED
Anesthesia: General

## 2014-11-28 MED ORDER — FENTANYL CITRATE (PF) 100 MCG/2ML IJ SOLN
INTRAMUSCULAR | Status: AC
  Filled 2014-11-28: qty 4

## 2014-11-28 MED ORDER — INDOMETHACIN 50 MG RE SUPP
50.0000 mg | RECTAL | Status: AC
  Administered 2014-11-28: 50 mg via RECTAL

## 2014-11-28 MED ORDER — CIPROFLOXACIN IN D5W 400 MG/200ML IV SOLN
INTRAVENOUS | Status: DC | PRN
  Administered 2014-11-28: 400 mg via INTRAVENOUS

## 2014-11-28 MED ORDER — LORAZEPAM 2 MG/ML IJ SOLN
INTRAMUSCULAR | Status: AC
Start: 2014-11-28 — End: 2014-11-28
  Administered 2014-11-28: 1 mg via INTRAVENOUS
  Filled 2014-11-28: qty 1

## 2014-11-28 MED ORDER — SUCCINYLCHOLINE CHLORIDE 20 MG/ML IJ SOLN
INTRAMUSCULAR | Status: DC | PRN
  Administered 2014-11-28: 100 mg via INTRAVENOUS

## 2014-11-28 MED ORDER — LACTATED RINGERS IV SOLN
INTRAVENOUS | Status: DC
  Administered 2014-11-28: 1000 mL via INTRAVENOUS
  Administered 2014-11-28: 17:00:00 via INTRAVENOUS

## 2014-11-28 MED ORDER — ONDANSETRON HCL 4 MG/2ML IJ SOLN
INTRAMUSCULAR | Status: AC
  Filled 2014-11-28: qty 2

## 2014-11-28 MED ORDER — ONDANSETRON HCL 4 MG/2ML IJ SOLN: 4.0000 mg | Freq: Once | INTRAMUSCULAR | Status: DC | PRN

## 2014-11-28 MED ORDER — PROPOFOL 10 MG/ML IV BOLUS
INTRAVENOUS | Status: AC
  Filled 2014-11-28: qty 20

## 2014-11-28 MED ORDER — ONDANSETRON HCL 4 MG/2ML IJ SOLN
INTRAMUSCULAR | Status: DC | PRN
  Administered 2014-11-28: 4 mg via INTRAVENOUS

## 2014-11-28 MED ORDER — FENTANYL CITRATE (PF) 100 MCG/2ML IJ SOLN
INTRAMUSCULAR | Status: DC | PRN
  Administered 2014-11-28: 50 ug via INTRAVENOUS
  Administered 2014-11-28 (×2): 25 ug via INTRAVENOUS
  Administered 2014-11-28: 50 ug via INTRAVENOUS
  Administered 2014-11-28 (×2): 25 ug via INTRAVENOUS

## 2014-11-28 MED ORDER — PHENYLEPHRINE HCL 10 MG/ML IJ SOLN
INTRAMUSCULAR | Status: DC | PRN
  Administered 2014-11-28: 40 ug via INTRAVENOUS

## 2014-11-28 MED ORDER — PROPOFOL 10 MG/ML IV BOLUS
INTRAVENOUS | Status: DC | PRN
  Administered 2014-11-28: 160 mg via INTRAVENOUS

## 2014-11-28 MED ORDER — IOHEXOL 350 MG/ML SOLN
INTRAVENOUS | Status: DC | PRN
  Administered 2014-11-28: 15 mL

## 2014-11-28 MED ORDER — LIDOCAINE HCL (CARDIAC) 20 MG/ML IV SOLN
INTRAVENOUS | Status: DC | PRN
  Administered 2014-11-28: 50 mg via INTRAVENOUS

## 2014-11-28 MED ORDER — CIPROFLOXACIN IN D5W 400 MG/200ML IV SOLN
INTRAVENOUS | Status: AC
  Filled 2014-11-28: qty 200

## 2014-11-28 MED ORDER — INDOMETHACIN 50 MG RE SUPP
RECTAL | Status: AC
  Filled 2014-11-28: qty 2

## 2014-11-28 MED ORDER — LIDOCAINE HCL (CARDIAC) 20 MG/ML IV SOLN
INTRAVENOUS | Status: AC
  Filled 2014-11-28: qty 5

## 2014-11-28 MED ORDER — FENTANYL CITRATE (PF) 100 MCG/2ML IJ SOLN: 25.0000 ug | INTRAMUSCULAR | Status: DC | PRN

## 2014-11-28 MED ORDER — PHENYLEPHRINE 40 MCG/ML (10ML) SYRINGE FOR IV PUSH (FOR BLOOD PRESSURE SUPPORT)
PREFILLED_SYRINGE | INTRAVENOUS | Status: AC
Start: 2014-11-28 — End: 2014-11-28
  Filled 2014-11-28: qty 10

## 2014-11-28 MED ORDER — LORAZEPAM 2 MG/ML IJ SOLN: 1.0000 mg | Freq: Once | INTRAMUSCULAR | Status: DC

## 2014-11-28 NOTE — Interval H&P Note (Signed)
History and Physical Interval Note:  11/28/2014 3:00 PM  Austin [name]  has presented today for surgery, with the diagnosis of biliary obstruction  The various methods of treatment have been discussed with the patient and family. After consideration of risks, benefits and other options for treatment, the patient has consented to  Procedure(s): ENDOSCOPIC RETROGRADE CHOLANGIOPANCREATOGRAPHY (ERCP) (N/A) as a surgical intervention .  The patient's history has been reviewed, patient examined, no change in status, stable for surgery.  I have reviewed the patient's chart and labs.  Questions were answered to the patient's satisfaction.     [name] D

## 2014-11-28 NOTE — Op Note (Signed)
[facility] [place], [address]   ERCP PROCEDURE REPORT        EXAM DATE: 11/28/2014  PATIENT NAME:          Austin [name], Austin [name]          MR #: [id number] BIRTHDATE:       April 06, 1934     VISIT #:     [phone] ATTENDING:     Carol Ada, MD     STATUS:     outpatient ASSISTANT:      Virgel Bouquet, and Salli Real J  INDICATIONS:  The patient is a 81 yr old male here for an ERCP due to malignant extrinsic compression of the CBD. PROCEDURE PERFORMED:     ERCP with stent placement MEDICATIONS:     General Anesthesia  CONSENT: The patient understands the risks and benefits of the procedure and understands that these risks include, but are not limited to: sedation, allergic reaction, infection, perforation and/or bleeding. Alternative means of evaluation and treatment include, among others: physical exam, x-rays, and/or surgical intervention. The patient elects to proceed with this endoscopic procedure.  DESCRIPTION OF PROCEDURE: During intra-op preparation period all mechanical & medical equipment was checked for proper function. Hand hygiene and appropriate measures for infection prevention was taken. After the risks, benefits and alternatives of the procedure were thoroughly explained, Informed was verified, confirmed and timeout was successfully executed by the treatment team. With the patient in left semi-prone position, medications were administered intravenously.The    was passed from the mouth into the esophagus and further advanced from the esophagus into the stomach. From stomach scope was directed to the second portion of the duodenum. Major papilla was aligned with the duodenoscope. The scope position was confirmed fluoroscopically. Rest of the findings/therapeutics are given below. The scope was then completely withdrawn from the patient and the procedure completed. The pulse, BP, and O2 saturation were  monitored and documented by the physician and the nursing staff throughout the entire procedure. The patient was cared for as planned according to standard protocol. The patient was then discharged to recovery in stable condition and with appropriate post procedure care. Estimated blood loss is zero unless otherwise noted in this procedure report.  The ampulla was located the second portion of the duodenum and there was some spontaneous drainage of bile.  The ampulla was easily cannulated, but it was very difficult to cannulate the CBD.  The sphincterotome was able to be inserted deeply into the ampulla that allowed a safe sphincterotomy without a guidewire.  The 0.035 guidewire was changed to a 0.025 guidewire.  After several attempts the guidewire was able to be advanced and secured in the right intrahepatic ducts.  Contrast injection revealed a very dilated CBD with a focal extrinsic stricture in the disatal CBD.  Advancing the sphincterotome was met with resistance and the decision was made to dilate the stricture.  A 4 mm x 60 mm dilation balloon was inflated across the stricture and the stenotic area was dilated x 15 seconds.  This facilitated placement of the 10 mm x 40 mm covered metallic stent.  Excellent drainage was obtained and fluoroscopic placement was confirmed.    ADVERSE EVENT:     No immediate. IMPRESSIONS:     Distal malignant stricture.  RECOMMENDATIONS:     1) Follow liver panel. 2) No further GI intervention at this time. REPEAT EXAM:   ___________________________________ Carol Ada, MD eSigned:  Carol Ada, MD  11/28/2014 4:52 PM   cc:  CPT CODES: ICD9 CODES:  The ICD and CPT codes recommended by this software are interpretations from the data that the clinical staff has captured with the software.  The verification of the translation of this report to the ICD and CPT codes and modifiers is the sole responsibility of the health care institution  and practicing physician where this report was generated.  [employer] will not be held responsible for the validity of the ICD and CPT codes included on this report.  AMA assumes no liability for data contained or not contained herein. CPT is a Designer, television/film set of the [employer].   PATIENT NAME:  Austin [name], Austin [name] MR#: [id number]

## 2014-11-28 NOTE — H&P (View-Only) (Signed)
Austin [name] KXF:[id number] DOB: 08-10-1933 DOA: 11/24/2014 PCP: No primary care provider on file.  Brief narrative:  81 y/o NSCLC s/p carboplatin/Pacltaxel last dose 01/2012 on observation,  Cirrhosis with Carlock diagnosed 08/2014-s/p microwave ablation per IR 10/20/14-MRI in 09/2014 =hypo-vascular lesion in the head of pancreas associated with marked narrowing of the distal common bile duct (without frank obstruction was apparent and slight progressive worsening of the pancreatic ductal dilatation. It was commented that they could be a slow-growing pancreatic neoplasm and recommended for a nonemergent ERCP CVA 1980's [name] 2000 -still smoker dm ty ii LAp Chole ~2009  Admit 11/24/14 3/7 h/o RUQ pain, -->back, n/v.  +bilious vomit Noted to have Obstructive jaundine/Pancreatitis/hyponatremia ?ERCP Dr Benson Norway 11/27/14  Past medical history-As per Problem list Chart reviewed as below-   Consultants:  Oncology  GI  Procedures:    Antibiotics:     Subjective   Very anxious this am and needed to be calmed with breathing awareness/ativan No cp No stool scheduled for scope this pm Family in room   Objective    Interim History:   Telemetry:    Objective: Filed Vitals:   11/27/14 0424 11/27/14 1415 11/27/14 2048 11/28/14 0449  BP: 134/75 142/118 143/93 138/77  Pulse: 108 71 102 107  Temp: 98.4 F (36.9 C) 98 F (36.7 C) 98.5 F (36.9 C) 98 F (36.7 C)  TempSrc: Oral Oral Oral Oral  Resp: '18 16 18 18  '$ Height:      Weight:      SpO2: 100% 100% 96% 98%    Intake/Output Summary (Last 24 hours) at 11/28/14 1139 Last data filed at 11/28/14 0450  Gross per 24 hour  Intake 1535.83 ml  Output   1775 ml  Net -239.17 ml    Exam:  General: eomi ncat- Icteric ++.  Poor visual acuity Cardiovascular:  s1 s2 no m/r/g Respiratory: clear no added sound Abdomen: distended + Shifting dullness Skin icteric Neuro no flap nor tremor groslly intact and moving 4 limbs  ='lly  Data Reviewed: Basic Metabolic Panel:  Recent Labs Lab 11/24/14 1309 11/25/14 0503 11/26/14 0532 11/27/14 0510 11/28/14 0540  NA 124* 129* 130* 131* 131*  K 4.0 4.0 4.0 4.2 3.7  CL 93* 96* 96* 98* 99*  CO2 '24 25 25 25 25  '$ GLUCOSE 243* 115* 97 103* 102*  BUN '15 12 10 9 7  '$ CREATININE 0.88 0.84 0.57* 0.61 0.66  CALCIUM 8.1* 8.2* 8.3* 8.5* 8.4*   Liver Function Tests:  Recent Labs Lab 11/24/14 1309 11/25/14 0503 11/26/14 0532 11/27/14 0510 11/28/14 0540  AST 81* 62* 61* 77* 76*  ALT 209* 155* 128* 113* 103*  ALKPHOS 319* 318* 347* 366* 371*  BILITOT 6.4* 7.1* 8.2* 9.4* 8.9*  PROT 6.7 6.1* 6.2* 6.3* 6.3*  ALBUMIN 3.2* 2.7* 2.7* 2.7* 2.8*    Recent Labs Lab 11/24/14 1309 11/26/14 0532  LIPASE 184* 66*   No results for input(s): AMMONIA in the last 168 hours. CBC:  Recent Labs Lab 11/24/14 1309 11/25/14 0503 11/26/14 0532  WBC 12.0* 9.2 7.9  NEUTROABS 9.1*  --  4.6  HGB 13.5 12.6* 12.5*  HCT 37.3* 36.2* 35.8*  MCV 89.4 89.6 90.2  PLT 214 216 234   Cardiac Enzymes: No results for input(s): CKTOTAL, CKMB, CKMBINDEX, TROPONINI in the last 168 hours. BNP: Invalid input(s): POCBNP CBG:  Recent Labs Lab 11/27/14 1651 11/27/14 2041 11/27/14 2355 11/28/14 0446 11/28/14 0756  GLUCAP 101* 100* 149* 94 109*  Recent Results (from the past 240 hour(s))  Urine culture     Status: None   Collection Time: 11/24/14  3:12 PM  Result Value Ref Range Status   Specimen Description URINE, CLEAN CATCH  Final   Special Requests NONE  Final   Culture   Final    MULTIPLE SPECIES PRESENT, SUGGEST RECOLLECTION Performed at [facility]    Report Status 11/26/2014 FINAL  Final     Studies:              All Imaging reviewed and is as per above notation   Scheduled Meds: . cholestyramine light  4 g Oral BID  . insulin aspart  0-9 Units Subcutaneous 6 times per day  . LORazepam      . nicotine  14 mg Transdermal Daily  . sodium chloride  3 mL  Intravenous Q12H   Continuous Infusions: . sodium chloride 50 mL/hr at 11/27/14 1754  . sodium chloride       Assessment/Plan:  Principal Problem:  Obstructive jaundice, Bilirubin ~7 -->8.2-->9.2 previously 0.49 --Ca 19-9 900 suggestive pancreatic malignancy 11/25/14  - defer to gi - cut back IVF 100-->50 cc/hr - ERCP scheduled 11/28/14 -trial Colestipol-unclear if will work with anatomic blockage -niece is a hospice RN-I will call her this pm once ERCP done    Biliary acute pancreatitis -2/2 to de novo pancreatic malignancy -npo for procedure  Hypervolemic hyponatremia 2/2 to cirrhosis -Urine OSM=301/Serum OSM [calc]=257 -2/2 to Ascites physiology 2/2 to Renin-Angiotensin dysregulation -replace IV saline 100-->50 cc/hr as trying to correct Sodium, but iv volume given in excess, would third-space -consider Albumin -Unclear if any role diuretics-defer to GI -Sodium 124--->129-->130-->131 and stable  Mild Lactic acidosis -monitor -no source infection-cancer related    TOBACCO DEPENDENCE -nicotine patch 14 mg -unlikely to quit    TREMOR, ESSENTIAL -monitor    Squamous cell lung cancer -Dr. 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

## 2014-11-28 NOTE — Transfer of Care (Signed)
Immediate Anesthesia Transfer of Care Note  Patient: Austin [name]  Procedure(s) Performed: Procedure(s): ENDOSCOPIC RETROGRADE CHOLANGIOPANCREATOGRAPHY (ERCP) (N/A)  Patient Location: PACU  Anesthesia Type:General  Level of Consciousness: awake, alert  and oriented  Airway & Oxygen Therapy: Patient Spontanous Breathing and Patient connected to face mask oxygen  Post-op Assessment: Report given to RN and Post -op Vital signs reviewed and stable  Post vital signs: Reviewed and stable  Last Vitals:  Filed Vitals:   11/28/14 1442  BP: 141/76  Pulse: 96  Temp: 36.4 C  Resp: 18    Complications: No apparent anesthesia complications

## 2014-11-28 NOTE — Anesthesia Procedure Notes (Signed)
Procedure Name: Intubation Date/Time: 11/28/2014 3:13 PM Performed by: Noralyn Pick D Pre-anesthesia Checklist: Patient identified, Emergency Drugs available, Suction available and Patient being monitored Patient Re-evaluated:Patient Re-evaluated prior to inductionOxygen Delivery Method: Circle System Utilized Preoxygenation: Pre-oxygenation with 100% oxygen Intubation Type: IV induction Ventilation: Mask ventilation without difficulty Laryngoscope Size: Mac and 4 Grade View: Grade II Tube type: Oral Tube size: 7.5 mm Number of attempts: 1 Airway Equipment and Method: Stylet and Oral airway Placement Confirmation: ETT inserted through vocal cords under direct vision,  positive ETCO2 and breath sounds checked- equal and bilateral Secured at: 22 cm Tube secured with: Tape Dental Injury: Teeth and Oropharynx as per pre-operative assessment

## 2014-11-28 NOTE — Anesthesia Postprocedure Evaluation (Signed)
  Anesthesia Post-op Note  Patient: Austin [name]  Procedure(s) Performed: Procedure(s) (LRB): ENDOSCOPIC RETROGRADE CHOLANGIOPANCREATOGRAPHY (ERCP) (N/A)  Patient Location: PACU  Anesthesia Type: General  Level of Consciousness: awake and alert   Airway and Oxygen Therapy: Patient Spontanous Breathing  Post-op Pain: mild  Post-op Assessment: Post-op Vital signs reviewed, Patient's Cardiovascular Status Stable, Respiratory Function Stable, Patent Airway and No signs of Nausea or vomiting  Last Vitals:  Filed Vitals:   11/28/14 1803  BP: 159/81  Pulse: 100  Temp: 36.5 C  Resp:     Post-op Vital Signs: stable   Complications: No apparent anesthesia complications

## 2014-11-28 NOTE — Progress Notes (Signed)
Austin [name] YWV:[id number] DOB: 1934-02-11 DOA: 11/24/2014 PCP: No primary care provider on file.  Brief narrative:  81 y/o NSCLC s/p carboplatin/Pacltaxel last dose 01/2012 on observation,  Cirrhosis with Austin [name] diagnosed 08/2014-s/p microwave ablation per IR 10/20/14-MRI in 09/2014 =hypo-vascular lesion in the head of pancreas associated with marked narrowing of the distal common bile duct (without frank obstruction was apparent and slight progressive worsening of the pancreatic ductal dilatation. It was commented that they could be a slow-growing pancreatic neoplasm and recommended for a nonemergent ERCP CVA 1980's Austin [name] 2000 -still smoker dm ty ii LAp Chole ~2009  Admit 11/24/14 3/7 h/o RUQ pain, -->back, n/v.  +bilious vomit Noted to have Obstructive jaundine/Pancreatitis/hyponatremia ?ERCP Dr Austin [name] 11/27/14  Past medical history-As per Problem list Chart reviewed as below-   Consultants:  Oncology  GI  Procedures:    Antibiotics:     Subjective   Very anxious this am and needed to be calmed with breathing awareness/ativan No cp No stool scheduled for scope this pm Family in room   Objective    Interim History:   Telemetry:    Objective: Filed Vitals:   11/27/14 0424 11/27/14 1415 11/27/14 2048 11/28/14 0449  BP: 134/75 142/118 143/93 138/77  Pulse: 108 71 102 107  Temp: 98.4 F (36.9 C) 98 F (36.7 C) 98.5 F (36.9 C) 98 F (36.7 C)  TempSrc: Oral Oral Oral Oral  Resp: '18 16 18 18  '$ Height:      Weight:      SpO2: 100% 100% 96% 98%    Intake/Output Summary (Last 24 hours) at 11/28/14 1139 Last data filed at 11/28/14 0450  Gross per 24 hour  Intake 1535.83 ml  Output   1775 ml  Net -239.17 ml    Exam:  General: eomi ncat- Icteric ++.  Poor visual acuity Cardiovascular:  s1 s2 no m/r/g Respiratory: clear no added sound Abdomen: distended + Shifting dullness Skin icteric Neuro no flap nor tremor groslly intact and moving 4 limbs  ='lly  Data Reviewed: Basic Metabolic Panel:  Recent Labs Lab 11/24/14 1309 11/25/14 0503 11/26/14 0532 11/27/14 0510 11/28/14 0540  NA 124* 129* 130* 131* 131*  K 4.0 4.0 4.0 4.2 3.7  CL 93* 96* 96* 98* 99*  CO2 '24 25 25 25 25  '$ GLUCOSE 243* 115* 97 103* 102*  BUN '15 12 10 9 7  '$ CREATININE 0.88 0.84 0.57* 0.61 0.66  CALCIUM 8.1* 8.2* 8.3* 8.5* 8.4*   Liver Function Tests:  Recent Labs Lab 11/24/14 1309 11/25/14 0503 11/26/14 0532 11/27/14 0510 11/28/14 0540  AST 81* 62* 61* 77* 76*  ALT 209* 155* 128* 113* 103*  ALKPHOS 319* 318* 347* 366* 371*  BILITOT 6.4* 7.1* 8.2* 9.4* 8.9*  PROT 6.7 6.1* 6.2* 6.3* 6.3*  ALBUMIN 3.2* 2.7* 2.7* 2.7* 2.8*    Recent Labs Lab 11/24/14 1309 11/26/14 0532  LIPASE 184* 66*   No results for input(s): AMMONIA in the last 168 hours. CBC:  Recent Labs Lab 11/24/14 1309 11/25/14 0503 11/26/14 0532  WBC 12.0* 9.2 7.9  NEUTROABS 9.1*  --  4.6  HGB 13.5 12.6* 12.5*  HCT 37.3* 36.2* 35.8*  MCV 89.4 89.6 90.2  PLT 214 216 234   Cardiac Enzymes: No results for input(s): CKTOTAL, CKMB, CKMBINDEX, TROPONINI in the last 168 hours. BNP: Invalid input(s): POCBNP CBG:  Recent Labs Lab 11/27/14 1651 11/27/14 2041 11/27/14 2355 11/28/14 0446 11/28/14 0756  GLUCAP 101* 100* 149* 94 109*  Recent Results (from the past 240 hour(s))  Urine culture     Status: None   Collection Time: 11/24/14  3:12 PM  Result Value Ref Range Status   Specimen Description URINE, CLEAN CATCH  Final   Special Requests NONE  Final   Culture   Final    MULTIPLE SPECIES PRESENT, SUGGEST RECOLLECTION Performed at [facility]    Report Status 11/26/2014 FINAL  Final     Studies:              All Imaging reviewed and is as per above notation   Scheduled Meds: . cholestyramine light  4 g Oral BID  . insulin aspart  0-9 Units Subcutaneous 6 times per day  . LORazepam      . nicotine  14 mg Transdermal Daily  . sodium chloride  3 mL  Intravenous Q12H   Continuous Infusions: . sodium chloride 50 mL/hr at 11/27/14 1754  . sodium chloride       Assessment/Plan:  Principal Problem:  Obstructive jaundice, Bilirubin ~7 -->8.2-->9.2 previously 0.49 --Ca 19-9 900 suggestive pancreatic malignancy 11/25/14  - defer to gi - cut back IVF 100-->50 cc/hr - ERCP scheduled 11/28/14 -trial Colestipol-unclear if will work with anatomic blockage -niece is a hospice RN-I will call her this pm once ERCP done    Biliary acute pancreatitis -2/2 to de novo pancreatic malignancy -npo for procedure  Hypervolemic hyponatremia 2/2 to cirrhosis -Urine OSM=301/Serum OSM [calc]=257 -2/2 to Ascites physiology 2/2 to Renin-Angiotensin dysregulation -replace IV saline 100-->50 cc/hr as trying to correct Sodium, but iv volume given in excess, would third-space -consider Albumin -Unclear if any role diuretics-defer to GI -Sodium 124--->129-->130-->131 and stable  Mild Lactic acidosis -monitor -no source infection-cancer related    TOBACCO DEPENDENCE -nicotine patch 14 mg -unlikely to quit    TREMOR, ESSENTIAL -monitor    Squamous cell lung cancer -Dr. 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

## 2014-11-29 DIAGNOSIS — Z720 Tobacco use: Secondary | ICD-10-CM

## 2014-11-29 DIAGNOSIS — K851 Biliary acute pancreatitis: Secondary | ICD-10-CM

## 2014-11-29 DIAGNOSIS — E119 Type 2 diabetes mellitus without complications: Secondary | ICD-10-CM

## 2014-11-29 DIAGNOSIS — C3490 Malignant neoplasm of unspecified part of unspecified bronchus or lung: Secondary | ICD-10-CM

## 2014-11-29 DIAGNOSIS — E871 Hypo-osmolality and hyponatremia: Secondary | ICD-10-CM

## 2014-11-29 DIAGNOSIS — C220 Liver cell carcinoma: Secondary | ICD-10-CM

## 2014-11-29 DIAGNOSIS — R1013 Epigastric pain: Secondary | ICD-10-CM

## 2014-11-29 DIAGNOSIS — R251 Tremor, unspecified: Secondary | ICD-10-CM

## 2014-11-29 LAB — GLUCOSE, CAPILLARY
GLUCOSE-CAPILLARY: 106 mg/dL — AB (ref 65–99)
GLUCOSE-CAPILLARY: 114 mg/dL — AB (ref 65–99)
GLUCOSE-CAPILLARY: 187 mg/dL — AB (ref 65–99)
GLUCOSE-CAPILLARY: 69 mg/dL (ref 65–99)
Glucose-Capillary: 89 mg/dL (ref 65–99)
Glucose-Capillary: 158 mg/dL — ABNORMAL HIGH (ref 65–99)

## 2014-11-29 LAB — COMPREHENSIVE METABOLIC PANEL
ALT: 91 U/L — AB (ref 17–63)
AST: 75 U/L — ABNORMAL HIGH (ref 15–41)
Albumin: 2.9 g/dL — ABNORMAL LOW (ref 3.5–5.0)
Alkaline Phosphatase: 362 U/L — ABNORMAL HIGH (ref 38–126)
Anion gap: 11 (ref 5–15)
BILIRUBIN TOTAL: 8.6 mg/dL — AB (ref 0.3–1.2)
BUN: 10 mg/dL (ref 6–20)
CO2: 24 mmol/L (ref 22–32)
Calcium: 8.5 mg/dL — ABNORMAL LOW (ref 8.9–10.3)
Chloride: 99 mmol/L — ABNORMAL LOW (ref 101–111)
Creatinine, Ser: 0.76 mg/dL (ref 0.61–1.24)
GFR calc Af Amer: >60 mL/min (ref >60)
GFR calc non Af Amer: >60 mL/min (ref >60)
Glucose, Bld: 103 mg/dL — ABNORMAL HIGH (ref 65–99)
POTASSIUM: 4.4 mmol/L (ref 3.5–5.1)
SODIUM: 134 mmol/L — AB (ref 135–145)
Total Protein: 6.5 g/dL (ref 6.5–8.1)
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
## 2014-11-30 ENCOUNTER — Encounter (HOSPITAL_COMMUNITY): Payer: Self-pay | Admitting: Gastroenterology

## 2014-11-30 LAB — COMPREHENSIVE METABOLIC PANEL
ALK PHOS: 311 U/L — AB (ref 38–126)
ALT: 75 U/L — AB (ref 17–63)
AST: 60 U/L — AB (ref 15–41)
Albumin: 2.8 g/dL — ABNORMAL LOW (ref 3.5–5.0)
Anion gap: 9 (ref 5–15)
BUN: 8 mg/dL (ref 6–20)
CO2: 24 mmol/L (ref 22–32)
CREATININE: 0.78 mg/dL (ref 0.61–1.24)
Calcium: 8.3 mg/dL — ABNORMAL LOW (ref 8.9–10.3)
Chloride: 99 mmol/L — ABNORMAL LOW (ref 101–111)
Glucose, Bld: 143 mg/dL — ABNORMAL HIGH (ref 65–99)
Potassium: 3.9 mmol/L (ref 3.5–5.1)
Sodium: 132 mmol/L — ABNORMAL LOW (ref 135–145)
TOTAL PROTEIN: 6.1 g/dL — AB (ref 6.5–8.1)
Total Bilirubin: 5.3 mg/dL — ABNORMAL HIGH (ref 0.3–1.2)

## 2014-11-30 LAB — CBC
HEMATOCRIT: 34.7 % — AB (ref 39.0–52.0)
HEMOGLOBIN: 12.2 g/dL — AB (ref 13.0–17.0)
MCH: 32.2 pg (ref 26.0–34.0)
MCHC: 35.2 g/dL (ref 30.0–36.0)
MCV: 91.6 fL (ref 78.0–100.0)
Platelets: 285 10*3/uL (ref 150–400)
RBC: 3.79 MIL/uL — ABNORMAL LOW (ref 4.22–5.81)
RDW: 15.8 % — AB (ref 11.5–15.5)
WBC: 7.2 10*3/uL (ref 4.0–10.5)

## 2014-11-30 LAB — GLUCOSE, CAPILLARY
GLUCOSE-CAPILLARY: 152 mg/dL — AB (ref 65–99)
Glucose-Capillary: 108 mg/dL — ABNORMAL HIGH (ref 65–99)
Glucose-Capillary: 176 mg/dL — ABNORMAL HIGH (ref 65–99)
Glucose-Capillary: 75 mg/dL (ref 65–99)

## 2014-11-30 MED ORDER — CHOLESTYRAMINE 4 G PO PACK: 4.0000 g | PACK | Freq: Every day | ORAL | Status: DC

## 2014-11-30 MED ORDER — NICOTINE 14 MG/24HR TD PT24
14.0000 mg | MEDICATED_PATCH | Freq: Every day | TRANSDERMAL | Status: DC
Start: 2014-11-30 — End: 2015-01-09

## 2014-11-30 MED ORDER — GUAIFENESIN-DM 100-10 MG/5ML PO SYRP: 5.0000 mL | ORAL_SOLUTION | ORAL | Status: DC | PRN

## 2014-11-30 NOTE — Progress Notes (Signed)
D/c via w/c w/ family no c/o voiced.

## 2014-11-30 NOTE — Progress Notes (Signed)
Subjective: Feeling well.  No complaints.  Enema helped.  Objective: Vital signs in last 24 hours: Temp:  [98 F (36.7 C)-98.2 F (36.8 C)] 98 F (36.7 C) (08/11 0419) Pulse Rate:  [81-101] 81 (08/11 0419) Resp:  [16] 16 (08/11 0419) BP: (122-133)/(69-70) 122/69 mmHg (08/11 0419) SpO2:  [96 %-99 %] 99 % (08/11 0419) Last BM Date: 11/24/14  Intake/Output from previous day: 08/10 0701 - 08/11 0700 In: 1960 [P.O.:960; I.V.:1000] Out: 1950 [Urine:1950] Intake/Output this shift: Total I/O In: 240 [P.O.:240] Out: -   General appearance: alert and no distress GI: soft, non-tender; bowel sounds normal; no masses,  no organomegaly  Lab Results:  Recent Labs  11/30/14 0520  WBC 7.2  HGB 12.2*  HCT 34.7*  PLT 285   BMET  Recent Labs  11/28/14 0540 11/29/14 0413 11/30/14 0520  NA 131* 134* 132*  K 3.7 4.4 3.9  CL 99* 99* 99*  CO2 '25 24 24  '$ GLUCOSE 102* 103* 143*  BUN '7 10 8  '$ CREATININE 0.66 0.76 0.78  CALCIUM 8.4* 8.5* 8.3*   LFT  Recent Labs  11/30/14 0520  PROT 6.1*  ALBUMIN 2.8*  AST 60*  ALT 75*  ALKPHOS 311*  BILITOT 5.3*   PT/INR No results for input(s): LABPROT, INR in the last 72 hours. Hepatitis Panel No results for input(s): HEPBSAG, HCVAB, HEPAIGM, HEPBIGM in the last 72 hours. C-Diff No results for input(s): CDIFFTOX in the last 72 hours. Fecal Lactopherrin No results for input(s): FECLLACTOFRN in the last 72 hours.  Studies/Results: Dg Ercp Biliary & Pancreatic Ducts  11/28/2014   CLINICAL DATA:  Pancreatic cancer.  ERCP.  EXAM: ERCP  TECHNIQUE: Multiple spot images obtained with the fluoroscopic device and submitted for interpretation post-procedure.  COMPARISON:  CT abdomen pelvis - 11/24/2014  FINDINGS: Seven spot intraoperative radiographic images of the right upper abdominal quadrant during ERCP are provided for review.  Initial image demonstrates an ERCP probe overlying the right upper abdominal quadrant. Surgical clips overlie the  expected location of the gallbladder fossa. Subsequent images demonstrate selective cannulation and opacification of the central aspect of the CBD which appears moderately dilated. Subsequent images demonstrate placement of a biliary stent overlying the distal aspect of the CBD.  There is no definitive opacification of the pancreatic duct.  IMPRESSION: ERCP with biliary stent placement as detailed above.  These images were submitted for radiologic interpretation only. Please see the procedural report for the amount of contrast and the fluoroscopy time utilized.   Electronically Signed   By: Sandi Mariscal M.D.   On: 11/28/2014 17:24    Medications:  Scheduled: . insulin aspart  0-9 Units Subcutaneous 6 times per day  . LORazepam  1 mg Intravenous Once  . nicotine  14 mg Transdermal Daily  . sodium chloride  3 mL Intravenous Q12H   Continuous: . 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   Clinically he is well.  His TB has dropped.  No further GI intervention is required at this time.  He can follow up with Oncology.   LOS: 6 days   Kleber Crean D 11/30/2014, 1:40 PM

## 2014-11-30 NOTE — Care Management Note (Signed)
Case Management Note  Patient Details  Name: Austin [name] MRN: [id number] Date of Birth: 09-25-1933  Subjective/Objective:                    Action/Plan:d/c home no needs or orders.   Expected Discharge Date:   (unknown)               Expected Discharge Plan:  Home/Self Care  In-House Referral:     Discharge planning Services  CM Consult  Post Acute Care Choice:    Choice offered to:     DME Arranged:    DME Agency:     HH Arranged:    [place] Agency:     Status of Service:  Completed, signed off  Medicare Important Message Given:  Yes-second notification given Date Medicare IM Given:    Medicare IM give by:    Date Additional Medicare IM Given:    Additional Medicare Important Message give by:     If discussed at [place] of Stay Meetings, dates discussed:    Additional Comments:  Dessa Phi, RN 11/30/2014, 11:20 AM

## 2014-11-30 NOTE — Care Management Important Message (Signed)
Important Message  Patient Details  Name: TREYON WYMORE MRN: [id number] Date of Birth: 08-05-1933   Medicare Important Message Given:  Yes-third notification given    Camillo Flaming 11/30/2014, 12:51 [place] Message  Patient Details  Name: ALECK LOCKLIN MRN: [id number] Date of Birth: [birth year]   Medicare Important Message Given:  Yes-third notification given    Camillo Flaming 11/30/2014, 12:51 PM
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
## 2014-11-30 NOTE — Discharge Instructions (Signed)
Jaundice  Jaundice is when the skin, whites of the eyes, and mucous membranes turn a yellowish color. It is caused by high levels of bilirubin in the blood. Bilirubin is produced by the normal breakdown of red blood cells. Jaundice may mean the liver or bile system in your body is not working right. HOME CARE  Rest.  Drink enough fluids to keep your pee (urine) clear or pale yellow.  Do not drink alcohol.  Only take medicine as told by your doctor.  If you have jaundice because of viral hepatitis or an infection:  Avoid close contact with people.  Avoid making food for others.  Avoid sharing eating utensils with others.  Wash your hands often.  Keep all follow-up visits with your doctor.  Use skin lotion to help with itching. GET HELP RIGHT AWAY IF:  You have more pain.  You keep throwing up (vomiting).  You lose too much body fluid (dehydration).  You have a fever or persistent symptoms for more than 72 hours.  You have a fever and your symptoms suddenly get worse.  You become weak or confused.  You develop a severe headache. MAKE SURE YOU:  Understand these instructions.  Will watch your condition.  Will get help right away if you are not doing well or get worse. Document Released: 05/10/2010 Document Revised: 06/30/2011 Document Reviewed: 05/10/2010 [facility] Patient Information 2015 [place], [place]. This information is not intended to replace advice given to you by your health care provider. Make sure you discuss any questions you have with your health care provider.

## 2014-11-30 NOTE — Progress Notes (Signed)
SSE given tol well

## 2014-12-05 ENCOUNTER — Ambulatory Visit
Admission: RE | Admit: 2014-12-05 | Discharge: 2014-12-05 | Disposition: A | Discharge status: Home / Self Care | Payer: Commercial Managed Care - HMO | Source: Ambulatory Visit | Attending: Radiology | Admitting: Radiology

## 2014-12-05 DIAGNOSIS — C220 Liver cell carcinoma: Secondary | ICD-10-CM
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
## 2014-12-18 ENCOUNTER — Emergency Department (HOSPITAL_COMMUNITY)
Admission: EM | Admit: 2014-12-18 | Discharge: 2014-12-19 | Disposition: A | Discharge status: Home / Self Care | Payer: Commercial Managed Care - HMO | Attending: Emergency Medicine | Admitting: Emergency Medicine

## 2014-12-18 ENCOUNTER — Encounter (HOSPITAL_COMMUNITY): Payer: Self-pay

## 2014-12-18 ENCOUNTER — Telehealth: Payer: Self-pay | Admitting: Internal Medicine

## 2014-12-18 ENCOUNTER — Telehealth: Payer: Self-pay | Admitting: *Deleted

## 2014-12-18 DIAGNOSIS — F419 Anxiety disorder, unspecified: Secondary | ICD-10-CM | POA: Diagnosis not present

## 2014-12-18 DIAGNOSIS — R45850 Homicidal ideations: Secondary | ICD-10-CM | POA: Diagnosis present

## 2014-12-18 DIAGNOSIS — Z720 Tobacco use: Secondary | ICD-10-CM | POA: Insufficient documentation

## 2014-12-18 DIAGNOSIS — Z87442 Personal history of urinary calculi: Secondary | ICD-10-CM | POA: Insufficient documentation

## 2014-12-18 DIAGNOSIS — Z8673 Personal history of transient ischemic attack (TIA), and cerebral infarction without residual deficits: Secondary | ICD-10-CM | POA: Insufficient documentation

## 2014-12-18 DIAGNOSIS — H269 Unspecified cataract: Secondary | ICD-10-CM | POA: Insufficient documentation

## 2014-12-18 DIAGNOSIS — Z79899 Other long term (current) drug therapy: Secondary | ICD-10-CM | POA: Insufficient documentation

## 2014-12-18 DIAGNOSIS — Z85118 Personal history of other malignant neoplasm of bronchus and lung: Secondary | ICD-10-CM | POA: Diagnosis not present

## 2014-12-18 DIAGNOSIS — Z8719 Personal history of other diseases of the digestive system: Secondary | ICD-10-CM | POA: Diagnosis not present

## 2014-12-18 DIAGNOSIS — E119 Type 2 diabetes mellitus without complications: Secondary | ICD-10-CM | POA: Insufficient documentation

## 2014-12-18 DIAGNOSIS — Z7982 Long term (current) use of aspirin: Secondary | ICD-10-CM | POA: Diagnosis not present

## 2014-12-18 DIAGNOSIS — Z880 Allergy status to penicillin: Secondary | ICD-10-CM | POA: Diagnosis not present

## 2014-12-18 DIAGNOSIS — Z01818 Encounter for other preprocedural examination: Secondary | ICD-10-CM

## 2014-12-18 LAB — CBC
HCT: 37.4 % — ABNORMAL LOW (ref 39.0–52.0)
Hemoglobin: 12.9 g/dL — ABNORMAL LOW (ref 13.0–17.0)
MCH: 32.2 pg (ref 26.0–34.0)
MCHC: 34.5 g/dL (ref 30.0–36.0)
MCV: 93.3 fL (ref 78.0–100.0)
Platelets: 270 10*3/uL (ref 150–400)
RBC: 4.01 MIL/uL — ABNORMAL LOW (ref 4.22–5.81)
RDW: 14.7 % (ref 11.5–15.5)
WBC: 7.3 10*3/uL (ref 4.0–10.5)

## 2014-12-18 LAB — COMPREHENSIVE METABOLIC PANEL
ALT: 48 U/L (ref 17–63)
AST: 42 U/L — ABNORMAL HIGH (ref 15–41)
Albumin: 3.6 g/dL (ref 3.5–5.0)
Alkaline Phosphatase: 212 U/L — ABNORMAL HIGH (ref 38–126)
Anion gap: 9 (ref 5–15)
BUN: 11 mg/dL (ref 6–20)
CHLORIDE: 96 mmol/L — AB (ref 101–111)
CO2: 26 mmol/L (ref 22–32)
Calcium: 9 mg/dL (ref 8.9–10.3)
Creatinine, Ser: 0.89 mg/dL (ref 0.61–1.24)
GFR calc non Af Amer: >60 mL/min (ref >60)
Glucose, Bld: 200 mg/dL — ABNORMAL HIGH (ref 65–99)
Potassium: 4 mmol/L (ref 3.5–5.1)
Sodium: 131 mmol/L — ABNORMAL LOW (ref 135–145)
Total Bilirubin: 1.3 mg/dL — ABNORMAL HIGH (ref 0.3–1.2)
Total Protein: 7.2 g/dL (ref 6.5–8.1)

## 2014-12-18 LAB — SALICYLATE LEVEL: Salicylate Lvl: <4 mg/dL (ref 2.8–30.0)

## 2014-12-18 LAB — RAPID URINE DRUG SCREEN, HOSP PERFORMED
Amphetamines: NOT DETECTED
Barbiturates: NOT DETECTED
Benzodiazepines: NOT DETECTED
Cocaine: NOT DETECTED
Opiates: NOT DETECTED
Tetrahydrocannabinol: NOT DETECTED

## 2014-12-18 LAB — ETHANOL: Alcohol, Ethyl (B): <5 mg/dL (ref <5)

## 2014-12-18 LAB — ACETAMINOPHEN LEVEL: Acetaminophen (Tylenol), Serum: <10 ug/mL — ABNORMAL LOW (ref 10–30)

## 2014-12-18 MED ORDER — LORAZEPAM 0.5 MG PO TABS
0.5000 mg | ORAL_TABLET | Freq: Once | ORAL | Status: AC
  Administered 2014-12-18: 0.5 mg via ORAL
  Filled 2014-12-18: qty 1

## 2014-12-18 NOTE — Telephone Encounter (Signed)
Pt's sister came in trying to figure out what they can do about her brother (patient) concerning his anxiety/panic attacks which is progressing closer to not understanding what he's dealing with. Pt is taking medication from other people to try to calm down and sister is real concern about pt doing this. Austin [name] is wanting [place] to contact her concerning this matter instead of pt so that this won't upset the patient. They are requesting pt to come in with to get evaluated .... Please contact sister Austin [name] with this concern.....   Thanks KIM

## 2014-12-18 NOTE — Telephone Encounter (Signed)
Pt in ED now for "panic attacks".
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
## 2014-12-18 NOTE — ED Notes (Signed)
Patient and patient's sister state that the patient has been having frequent agitation and anxiety when he talks to people on the phone and/or in person. Patient states that he feels like he could take a gun and shoot it at people when he gets agitated. Patient denies SI, visual or auditory hallucinations.

## 2014-12-18 NOTE — ED Notes (Signed)
Implanted port rt chest; has not been accessed x several years per pt Austin Hailey, RN

## 2014-12-18 NOTE — ED Notes (Addendum)
Pts sister stated the pt has been coming increasingly agitated over the last few days. He is cooperative but can be loud at times.Pt was given dinner and presently is having a telepsych at the bedside. (6:55pm)Report given to [place].

## 2014-12-18 NOTE — Telephone Encounter (Signed)
Walk in form received from [facility] along with message that patient is not here at this time. Form given to Dr. Ian Bushman nurse.
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
## 2014-12-19 ENCOUNTER — Emergency Department (HOSPITAL_COMMUNITY): Payer: Commercial Managed Care - HMO

## 2014-12-19 DIAGNOSIS — F419 Anxiety disorder, unspecified: Secondary | ICD-10-CM | POA: Diagnosis present

## 2014-12-19 LAB — CBG MONITORING, ED: Glucose-Capillary: 134 mg/dL — ABNORMAL HIGH (ref 65–99)

## 2014-12-19 LAB — URINALYSIS, ROUTINE W REFLEX MICROSCOPIC
Bilirubin Urine: NEGATIVE
Glucose, UA: 100 mg/dL — AB
Hgb urine dipstick: NEGATIVE
Ketones, ur: NEGATIVE mg/dL
Leukocytes, UA: NEGATIVE
NITRITE: NEGATIVE
PH: 6 (ref 5.0–8.0)
Protein, ur: NEGATIVE mg/dL
SPECIFIC GRAVITY, URINE: 1.012 (ref 1.005–1.030)
Urobilinogen, UA: 1 mg/dL (ref 0.0–1.0)

## 2014-12-19 MED ORDER — ENSURE ENLIVE PO LIQD
237.0000 mL | Freq: Every day | ORAL | Status: DC
  Administered 2014-12-19: 237 mL via ORAL
  Filled 2014-12-19: qty 237

## 2014-12-19 MED ORDER — HYDROXYZINE PAMOATE 25 MG PO CAPS: 25.0000 mg | ORAL_CAPSULE | Freq: Two times a day (BID) | ORAL | Status: DC | PRN

## 2014-12-19 MED ORDER — LORAZEPAM 0.5 MG PO TABS: 0.5000 mg | ORAL_TABLET | ORAL | Status: DC | PRN

## 2014-12-19 MED ORDER — DIFLUPREDNATE 0.05 % OP EMUL
1.0000 [drp] | Freq: Two times a day (BID) | OPHTHALMIC | Status: DC
Start: 2014-12-19 — End: 2014-12-19

## 2014-12-19 MED ORDER — PREDNISOLONE ACETATE 1 % OP SUSP
1.0000 [drp] | Freq: Four times a day (QID) | OPHTHALMIC | Status: DC
Start: 2014-12-19 — End: 2014-12-19
  Filled 2014-12-19: qty 1

## 2014-12-19 MED ORDER — PREDNISOLONE ACETATE 1 % OP SUSP: 1.0000 [drp] | Freq: Four times a day (QID) | OPHTHALMIC | Status: DC
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
## 2014-12-19 NOTE — ED Notes (Signed)
Bed: WA20 Expected date:  Expected time:  Means of arrival:  Comments: Hold for TCU 26

## 2014-12-19 NOTE — [facility] Suicide Risk Assessment (Cosign Needed)
Suicide Risk Assessment  Discharge Assessment   [facility] Discharge Suicide Risk Assessment   Demographic Factors:  Male, Age 81 or older, Caucasian, Low socioeconomic status, Living alone and Unemployed  Total Time spent with patient: 20 minutes  Musculoskeletal: Strength & Muscle Tone: seen lying in bed Gait & Station: seen in bed lying down Patient leans: see above  Psychiatric Specialty Exam:     Blood pressure 131/74, pulse 94, temperature 97.9 F (36.6 [name]), temperature source Oral, resp. rate 18, SpO2 100 %.There is no weight on file to calculate BMI.  General Appearance: Casual and Fairly Groomed  Engineer, water::  Good  Speech:  Clear and Coherent and Normal Rate  Volume:  Normal  Mood:  Anxious and Euthymic  Affect:  Congruent  Thought Process:  Coherent, Goal Directed and Intact  Orientation:  Full (Time, Place, and Person)  Thought Content:  WDL  Suicidal Thoughts:  No  Homicidal Thoughts:  No  Memory:  Immediate;   Good Recent;   Good Remote;   Good  Judgement:  Good  Insight:  Good  Psychomotor Activity:  Normal  Concentration:  Good  Recall:  Good  Fund of Knowledge:Good  Language: Good  Akathisia:  NA  Handed:  Right  AIMS (if indicated):     Assets:  Desire for Improvement  ADL's:  Intact  Cognition: WNL        Has this patient used any form of tobacco in the last 30 days? (Cigarettes, Smokeless Tobacco, Cigars, and/or Pipes) Yes, A prescription for an FDA-approved tobacco cessation medication was offered at discharge and the patient refused  Mental Status Per Nursing Assessment::   On Admission:     Current Mental Status by Physician: NA  Loss Factors: NA  Historical Factors: NA  Risk Reduction Factors:   Positive social support  Continued Clinical Symptoms:  Severe Anxiety and/or Agitation  Cognitive Features That Contribute To Risk:  Polarized thinking    Suicide Risk:  Minimal: No identifiable suicidal ideation.  Patients presenting  with no risk factors but with morbid ruminations; may be classified as minimal risk based on the severity of the depressive symptoms  Principal Problem: Anxiety disorder Discharge Diagnoses:  Patient Active Problem List   Diagnosis Date Noted  . Anxiety disorder [F41.9] 12/19/2014    Priority: High  . Obstructive jaundice [K83.8] 11/24/2014  . Biliary acute pancreatitis [K85.1] 11/24/2014  . Hyponatremia [E87.1] 11/24/2014  . [place] (hepatocellular carcinoma) [C22.0] 10/20/2014  . Type 2 diabetes mellitus with hyperglycemia [E11.65] 10/20/2014  . Hepatocellular carcinoma [C22.0] 09/14/2014  . Diabetes type 2, controlled [E11.9] 09/07/2014  . Decreased hearing [H91.90] 08/16/2014  . Back pain [M54.9] 08/16/2014  . Abdominal pain, epigastric [R10.13] 08/16/2014  . Anxiety and depression [F41.8] 10/13/2012  . Preventative health care [Z00.00] 10/13/2012  . on chemotherapy [Z51.11] 01/27/2012  . Parotid nodule [K11.8] 07/11/2011  . Squamous cell lung cancer [C34.90] 06/20/2011  . CHEST WALL PAIN, ANTERIOR [R07.1] 03/29/2010  . TREMOR, ESSENTIAL [R25.1] 03/14/2009  . ELEVATED BLOOD PRESSURE WITHOUT DIAGNOSIS OF HYPERTENSION [R03.0] 04/05/2007  . ALCOHOL DEPENDENCE [F10.20] 06/18/2006  . TOBACCO DEPENDENCE [Z72.0] 06/18/2006  . CVA [I67.89] 06/18/2006    Follow-up Information    Follow up with [facility]  On 02/02/2015.   Why:  9am for registration   Contact information:   [address], [place], [place] [address] Phone: [phone]      Schedule an appointment as soon as possible for a visit with [facility] .  Contact information:   [facility]  [place] [phone]      Plan Of Care/Follow-up recommendations:  Activity:  as tolerated Diet:  regular  Is patient on multiple antipsychotic therapies at discharge:  No   Has Patient had three or more failed trials of antipsychotic monotherapy by history:  No  Recommended Plan for  Multiple Antipsychotic Therapies: NA    Austin [name]   PMHNP-BC 12/19/2014, 12:32 PM

## 2014-12-19 NOTE — ED Notes (Signed)
Notified pharmacy of need for eye drops

## 2014-12-19 NOTE — Progress Notes (Signed)
Pt psychiatrically stable for discharge home per Psychiatrist and NP. Patient to be discharged with rx for anxiety per NP. Patietn sister to pick up patient around 1230. Pt referred to Dr. Marchelle Gearing at [facility]. Pt also referred to LCSW at [facility] for counseling. Pt sister is concerned that patient is not understanding of current diagnosis and prognosis.   Belia Heman, [place] Work  [employer] [phone]

## 2014-12-23 ENCOUNTER — Emergency Department (HOSPITAL_COMMUNITY)
Admission: EM | Admit: 2014-12-23 | Discharge: 2014-12-25 | Disposition: A | Discharge status: Home / Self Care | Payer: Commercial Managed Care - HMO | Attending: Emergency Medicine | Admitting: Emergency Medicine

## 2014-12-23 ENCOUNTER — Encounter (HOSPITAL_COMMUNITY): Payer: Self-pay | Admitting: *Deleted

## 2014-12-23 DIAGNOSIS — F4324 Adjustment disorder with disturbance of conduct: Secondary | ICD-10-CM | POA: Insufficient documentation

## 2014-12-23 DIAGNOSIS — Z7982 Long term (current) use of aspirin: Secondary | ICD-10-CM | POA: Insufficient documentation

## 2014-12-23 DIAGNOSIS — Z880 Allergy status to penicillin: Secondary | ICD-10-CM | POA: Diagnosis not present

## 2014-12-23 DIAGNOSIS — F419 Anxiety disorder, unspecified: Secondary | ICD-10-CM | POA: Diagnosis not present

## 2014-12-23 DIAGNOSIS — R4182 Altered mental status, unspecified: Secondary | ICD-10-CM | POA: Diagnosis present

## 2014-12-23 DIAGNOSIS — Z87442 Personal history of urinary calculi: Secondary | ICD-10-CM | POA: Diagnosis not present

## 2014-12-23 DIAGNOSIS — Z8719 Personal history of other diseases of the digestive system: Secondary | ICD-10-CM | POA: Insufficient documentation

## 2014-12-23 DIAGNOSIS — F1310 Sedative, hypnotic or anxiolytic abuse, uncomplicated: Secondary | ICD-10-CM | POA: Insufficient documentation

## 2014-12-23 DIAGNOSIS — Z8673 Personal history of transient ischemic attack (TIA), and cerebral infarction without residual deficits: Secondary | ICD-10-CM | POA: Diagnosis not present

## 2014-12-23 DIAGNOSIS — Z85118 Personal history of other malignant neoplasm of bronchus and lung: Secondary | ICD-10-CM | POA: Diagnosis not present

## 2014-12-23 DIAGNOSIS — Z720 Tobacco use: Secondary | ICD-10-CM | POA: Diagnosis not present

## 2014-12-23 DIAGNOSIS — E119 Type 2 diabetes mellitus without complications: Secondary | ICD-10-CM | POA: Diagnosis not present

## 2014-12-23 DIAGNOSIS — Z7952 Long term (current) use of systemic steroids: Secondary | ICD-10-CM | POA: Insufficient documentation

## 2014-12-23 DIAGNOSIS — Z79899 Other long term (current) drug therapy: Secondary | ICD-10-CM | POA: Insufficient documentation

## 2014-12-23 DIAGNOSIS — R451 Restlessness and agitation: Secondary | ICD-10-CM

## 2014-12-23 NOTE — ED Notes (Signed)
Bed: KP54 Expected date:  Expected time:  Means of arrival:  Comments: T4
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
## 2014-12-24 ENCOUNTER — Emergency Department (HOSPITAL_COMMUNITY): Payer: Commercial Managed Care - HMO

## 2014-12-24 DIAGNOSIS — F419 Anxiety disorder, unspecified: Secondary | ICD-10-CM | POA: Diagnosis not present

## 2014-12-24 DIAGNOSIS — F4324 Adjustment disorder with disturbance of conduct: Secondary | ICD-10-CM | POA: Diagnosis not present

## 2014-12-24 LAB — RAPID URINE DRUG SCREEN, HOSP PERFORMED
Amphetamines: NOT DETECTED
Barbiturates: NOT DETECTED
Benzodiazepines: POSITIVE — AB
COCAINE: NOT DETECTED
OPIATES: NOT DETECTED
Tetrahydrocannabinol: NOT DETECTED

## 2014-12-24 LAB — COMPREHENSIVE METABOLIC PANEL
ALK PHOS: 164 U/L — AB (ref 38–126)
ALT: 41 U/L (ref 17–63)
ANION GAP: 7 (ref 5–15)
AST: 65 U/L — ABNORMAL HIGH (ref 15–41)
Albumin: 3.4 g/dL — ABNORMAL LOW (ref 3.5–5.0)
BILIRUBIN TOTAL: 1.7 mg/dL — AB (ref 0.3–1.2)
BUN: 18 mg/dL (ref 6–20)
CALCIUM: 8.6 mg/dL — AB (ref 8.9–10.3)
CO2: 24 mmol/L (ref 22–32)
Chloride: 101 mmol/L (ref 101–111)
Creatinine, Ser: 1.07 mg/dL (ref 0.61–1.24)
GFR calc non Af Amer: >60 mL/min (ref >60)
Glucose, Bld: 117 mg/dL — ABNORMAL HIGH (ref 65–99)
Potassium: 3.7 mmol/L (ref 3.5–5.1)
SODIUM: 132 mmol/L — AB (ref 135–145)
TOTAL PROTEIN: 6.9 g/dL (ref 6.5–8.1)

## 2014-12-24 LAB — SALICYLATE LEVEL

## 2014-12-24 LAB — ETHANOL: Alcohol, Ethyl (B): <5 mg/dL (ref <5)

## 2014-12-24 LAB — CBC
HEMATOCRIT: 35.8 % — AB (ref 39.0–52.0)
HEMOGLOBIN: 12.7 g/dL — AB (ref 13.0–17.0)
MCH: 32.6 pg (ref 26.0–34.0)
MCHC: 35.5 g/dL (ref 30.0–36.0)
MCV: 92 fL (ref 78.0–100.0)
Platelets: 229 10*3/uL (ref 150–400)
RBC: 3.89 MIL/uL — ABNORMAL LOW (ref 4.22–5.81)
RDW: 14.8 % (ref 11.5–15.5)
WBC: 10.2 10*3/uL (ref 4.0–10.5)

## 2014-12-24 LAB — URINALYSIS, ROUTINE W REFLEX MICROSCOPIC
Glucose, UA: 500 mg/dL — AB
Hgb urine dipstick: NEGATIVE
Ketones, ur: NEGATIVE mg/dL
LEUKOCYTES UA: NEGATIVE
NITRITE: NEGATIVE
PH: 5.5 (ref 5.0–8.0)
Protein, ur: 30 mg/dL — AB
SPECIFIC GRAVITY, URINE: 1.029 (ref 1.005–1.030)
Urobilinogen, UA: 1 mg/dL (ref 0.0–1.0)

## 2014-12-24 LAB — URINE MICROSCOPIC-ADD ON

## 2014-12-24 LAB — ACETAMINOPHEN LEVEL: Acetaminophen (Tylenol), Serum: <10 ug/mL — ABNORMAL LOW (ref 10–30)

## 2014-12-24 MED ORDER — LORAZEPAM 1 MG PO TABS: 1.0000 mg | ORAL_TABLET | Freq: Three times a day (TID) | ORAL | Status: DC | PRN

## 2014-12-24 MED ORDER — IBUPROFEN 200 MG PO TABS: 600.0000 mg | ORAL_TABLET | Freq: Three times a day (TID) | ORAL | Status: DC | PRN

## 2014-12-24 MED ORDER — ONDANSETRON HCL 4 MG PO TABS: 4.0000 mg | ORAL_TABLET | Freq: Three times a day (TID) | ORAL | Status: DC | PRN

## 2014-12-24 MED ORDER — HYDROXYZINE HCL 25 MG PO TABS: 25.0000 mg | ORAL_TABLET | Freq: Three times a day (TID) | ORAL | Status: DC | PRN

## 2014-12-24 MED ORDER — ACETAMINOPHEN 325 MG PO TABS: 650.0000 mg | ORAL_TABLET | ORAL | Status: DC | PRN

## 2014-12-24 MED ORDER — ZOLPIDEM TARTRATE 5 MG PO TABS: 5.0000 mg | ORAL_TABLET | Freq: Every evening | ORAL | Status: DC | PRN

## 2014-12-24 MED ORDER — ALUM & MAG HYDROXIDE-SIMETH 200-200-20 MG/5ML PO SUSP: 30.0000 mL | ORAL | Status: DC | PRN

## 2014-12-24 MED ORDER — NICOTINE 21 MG/24HR TD PT24: 21.0000 mg | MEDICATED_PATCH | Freq: Every day | TRANSDERMAL | Status: DC

## 2014-12-24 NOTE — Progress Notes (Signed)
12:21pm. CSW met with pt and pt's sister, Oswaldo Milian ([phone]), at bedside. Hassan Rowan is pt's POA. Pt granted permission for team to also give updates to niece , [name] Rothrock. Pt signed consent and it was placed on chart. Lupita Dawn is a Merchandiser, retail by profession. To clarify, pt is NOT hospice patient and, per family, does not qualify at this time. Hassan Rowan asks that she be kept in the loop re: pt's disposition.   CSW to sign off but available for consult if needed.  [place] Worker [facility] phone: [phone]

## 2014-12-24 NOTE — ED Notes (Addendum)
Pt was moved from the main ER to room 26. Pt is cooperative and pleasant . He is alert and oriented times three. Pt does have equal upper body strength. He is able to follow commands . No tongue deviation .Pt does have a difficult time tracking . Willl continue to monitor closely. No slurred speech noted . Pt denies a headache. Report to [place]. Pt is awaiting a bed at [facility].

## 2014-12-24 NOTE — Progress Notes (Signed)
Disposition CSW completed patient referrals for inpatient Geri-Psych treatment at the following facilities:  [facility] [facility]  CSW will continue to assist with placement need.  [place] Disposition CSW [phone]

## 2014-12-24 NOTE — ED Notes (Signed)
Verbal given to Marcie Bal, Therapist, sports.  Being transferred to [facility] room 26.

## 2014-12-24 NOTE — BH Assessment (Signed)
AM psych eval recommended. Dr. Canary Brim has been informed.

## 2014-12-24 NOTE — ED Notes (Signed)
Pt family came by and request information about pt and where pt will be going. Family was advised of the different requests that were sent to the 10 different facilities. Pt sister (POA) and pt niece are the contacts for pt and wish to be contacted with information.  They are also requesting that pt be placed into [place] if possible so that he will be closer to them.  Oswaldo Milian [phone] Lupita Dawn  [phone]
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
## 2014-12-24 NOTE — ED Notes (Signed)
Pt is unable to urinate at this time. Pt is aware that a urine sample is needed. Urinal at bedside.
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
## 2014-12-24 NOTE — BHH Counselor (Signed)
TC from [place] at [facility]. She states that they have no beds, but they will hold onto the referral as they may have some discharges tomorrow.  Arnold Long, [place] Therapeutic Triage Specialist

## 2014-12-25 DIAGNOSIS — R451 Restlessness and agitation: Secondary | ICD-10-CM | POA: Insufficient documentation

## 2014-12-25 DIAGNOSIS — F4324 Adjustment disorder with disturbance of conduct: Secondary | ICD-10-CM | POA: Diagnosis not present

## 2014-12-25 MED ORDER — QUETIAPINE FUMARATE 50 MG PO TABS
50.0000 mg | ORAL_TABLET | Freq: Every day | ORAL | Status: DC
Start: 2014-12-25 — End: 2014-12-25

## 2014-12-25 MED ORDER — QUETIAPINE FUMARATE 50 MG PO TABS: 50.0000 mg | ORAL_TABLET | Freq: Every day | ORAL | Status: DC

## 2014-12-25 MED ORDER — HYDROXYZINE HCL 25 MG PO TABS: 25.0000 mg | ORAL_TABLET | Freq: Two times a day (BID) | ORAL | Status: DC | PRN

## 2014-12-25 NOTE — ED Notes (Signed)
Psychiatry to call sister to pick up pt.

## 2014-12-25 NOTE — Progress Notes (Signed)
Pt without a pcp Pt states his pcp at [facility] (later stated [facility] family [facility]) had "reired": and he does not know who he is now assigned to Pt wants to consult [facility] cancer dr Earlie Server about the new pcp CM offered pt a five page list of [place] providers from [email or web address] Pt appreciative

## 2014-12-25 NOTE — ED Notes (Signed)
Psychiatry at bedside.

## 2014-12-25 NOTE — BH Assessment (Signed)
[name] Assessment Progress Note Patient was seen this date for re-evaluation with patient presenting with appropriate affect stating he felt he was ready to be discharged. Collateral information obtained from nurses and patient notes suggest patient has returned to his baseline reporting lower levels of anxiety levels. This Probation officer contacted the patients sister who stated she would transport him back to his residence if discharged. Patient stated he was open to medication interventions and would be compliant if discharged with medications prescribed. Patient denied any S/I or H/I and stated he felt he was ready to return back to his residence. After reviewing the above with on staff M.D. Akintayo  It was decided that patient could be discharged back to his residence. Patient's sister was contacted and stated she would come pick him up this date.

## 2014-12-25 NOTE — BHH Suicide Risk Assessment (Signed)
Suicide Risk Assessment  Discharge Assessment   [facility] Discharge Suicide Risk Assessment   Demographic Factors:  Male, Age 81 or older and Caucasian  Total Time spent with patient: 30 minutes  Musculoskeletal: Strength & Muscle Tone: within normal limits Gait & Station: normal Patient leans: N/A  Psychiatric Specialty Exam: Physical Exam  Review of Systems  Constitutional: Negative.   HENT: Negative.   Eyes: Negative.   Respiratory: Negative.   Cardiovascular: Negative.   Gastrointestinal: Negative.   Genitourinary: Negative.   Musculoskeletal: Negative.   Skin: Negative.   Neurological: Negative.   Endo/Heme/Allergies: Negative.   Psychiatric/Behavioral: The patient is nervous/anxious.     Blood pressure 122/72, pulse 93, temperature 97.6 F (36.4 C), temperature source Oral, resp. rate 16, height '5\' 11"'$  (1.803 m), weight 79.379 kg (175 lb), SpO2 99 %.Body mass index is 24.42 kg/(m^2).  General Appearance: Casual  Eye Contact::  Good  Speech:  Normal Rate  Volume:  Normal  Mood:  Euthymic, anxiety earlier but mild  Affect:  Congruent  Thought Process:  Coherent  Orientation:  Full (Time, Place, and Person)  Thought Content:  WDL  Suicidal Thoughts:  No  Homicidal Thoughts:  No  Memory:  Immediate;   Good Recent;   Good Remote;   Good  Judgement:  Fair  Insight:  Fair  Psychomotor Activity:  Normal  Concentration:  Good  Recall:  Good  Fund of Knowledge:Good  Language: Good  Akathisia:  No  Handed:  Right  AIMS (if indicated):     Assets:  Housing Leisure Time Resilience Social Support  ADL's:  Intact  Cognition: WNL  Sleep:         Has this patient used any form of tobacco in the last 30 days? (Cigarettes, Smokeless Tobacco, Cigars, and/or Pipes) No  Mental Status Per Nursing Assessment::   On Admission:   agitation  Current Mental Status by Physician: NA  Loss Factors: NA  Historical Factors: NA  Risk Reduction Factors:   Sense of  responsibility to family, Living with another person, especially a relative and Positive social support  Continued Clinical Symptoms:  None  Cognitive Features That Contribute To Risk:  None    Suicide Risk:  Minimal: No identifiable suicidal ideation.  Patients presenting with no risk factors but with morbid ruminations; may be classified as minimal risk based on the severity of the depressive symptoms  Principal Problem: Adjustment disorder with disturbance of conduct Discharge Diagnoses:  Patient Active Problem List   Diagnosis Date Noted  . Adjustment disorder with disturbance of conduct [F43.24] 12/25/2014    Priority: High  . Anxiety disorder [F41.9] 12/19/2014    Priority: High  . Agitation [R45.1]   . Obstructive jaundice [K83.8] 11/24/2014  . Biliary acute pancreatitis [K85.1] 11/24/2014  . Hyponatremia [E87.1] 11/24/2014  . [place] (hepatocellular carcinoma) [C22.0] 10/20/2014  . Type 2 diabetes mellitus with hyperglycemia [E11.65] 10/20/2014  . Hepatocellular carcinoma [C22.0] 09/14/2014  . Diabetes type 2, controlled [E11.9] 09/07/2014  . Decreased hearing [H91.90] 08/16/2014  . Back pain [M54.9] 08/16/2014  . Abdominal pain, epigastric [R10.13] 08/16/2014  . Anxiety and depression [F41.8] 10/13/2012  . Preventative health care [Z00.00] 10/13/2012  . on chemotherapy [Z51.11] 01/27/2012  . Parotid nodule [K11.8] 07/11/2011  . Squamous cell lung cancer [C34.90] 06/20/2011  . CHEST WALL PAIN, ANTERIOR [R07.1] 03/29/2010  . TREMOR, ESSENTIAL [R25.1] 03/14/2009  . ELEVATED BLOOD PRESSURE WITHOUT DIAGNOSIS OF HYPERTENSION [R03.0] 04/05/2007  . ALCOHOL DEPENDENCE [F10.20] 06/18/2006  . TOBACCO DEPENDENCE [  Z72.0] 06/18/2006  . CVA [I67.89] 06/18/2006      Plan Of Care/Follow-up recommendations:  Activity:  as tolerated Diet:  heart healthy diet  Is patient on multiple antipsychotic therapies at discharge:  No   Has Patient had three or more failed trials of  antipsychotic monotherapy by history:  No  Recommended Plan for Multiple Antipsychotic Therapies: NA    French Kendra, PMH-NP 12/25/2014, 10:35 AM
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
## 2014-12-25 NOTE — ED Notes (Signed)
Pt on phone speaking with family

## 2015-01-01 ENCOUNTER — Ambulatory Visit (HOSPITAL_COMMUNITY)
Admission: RE | Admit: 2015-01-01 | Discharge: 2015-01-01 | Disposition: A | Discharge status: Home / Self Care | Payer: Commercial Managed Care - HMO | Source: Ambulatory Visit | Attending: Internal Medicine | Admitting: Internal Medicine

## 2015-01-01 ENCOUNTER — Other Ambulatory Visit (HOSPITAL_BASED_OUTPATIENT_CLINIC_OR_DEPARTMENT_OTHER): Payer: Commercial Managed Care - HMO

## 2015-01-01 ENCOUNTER — Encounter (HOSPITAL_COMMUNITY): Payer: Self-pay

## 2015-01-01 DIAGNOSIS — C220 Liver cell carcinoma: Secondary | ICD-10-CM

## 2015-01-01 DIAGNOSIS — C3490 Malignant neoplasm of unspecified part of unspecified bronchus or lung: Secondary | ICD-10-CM

## 2015-01-01 DIAGNOSIS — K7460 Unspecified cirrhosis of liver: Secondary | ICD-10-CM | POA: Insufficient documentation

## 2015-01-01 DIAGNOSIS — C3432 Malignant neoplasm of lower lobe, left bronchus or lung: Secondary | ICD-10-CM | POA: Diagnosis not present

## 2015-01-01 LAB — CBC WITH DIFFERENTIAL/PLATELET
BASO%: 1.3 % (ref 0.0–2.0)
Basophils Absolute: 0.1 10*3/uL (ref 0.0–0.1)
EOS%: 2.9 % (ref 0.0–7.0)
Eosinophils Absolute: 0.2 10*3/uL (ref 0.0–0.5)
HEMATOCRIT: 39.8 % (ref 38.4–49.9)
HEMOGLOBIN: 13.7 g/dL (ref 13.0–17.1)
LYMPH#: 1.5 10*3/uL (ref 0.9–3.3)
LYMPH%: 26.3 % (ref 14.0–49.0)
MCH: 31.9 pg (ref 27.2–33.4)
MCHC: 34.3 g/dL (ref 32.0–36.0)
MCV: 93.2 fL (ref 79.3–98.0)
MONO#: 1 10*3/uL — AB (ref 0.1–0.9)
MONO%: 17.4 % — ABNORMAL HIGH (ref 0.0–14.0)
NEUT%: 52.1 % (ref 39.0–75.0)
NEUTROS ABS: 3 10*3/uL (ref 1.5–6.5)
PLATELETS: 253 10*3/uL (ref 140–400)
RBC: 4.28 10*6/uL (ref 4.20–5.82)
RDW: 15.5 % — AB (ref 11.0–14.6)
WBC: 5.8 10*3/uL (ref 4.0–10.3)

## 2015-01-01 LAB — COMPREHENSIVE METABOLIC PANEL (CC13)
ALBUMIN: 3.1 g/dL — AB (ref 3.5–5.0)
ALT: 34 U/L (ref 0–55)
ANION GAP: 6 meq/L (ref 3–11)
AST: 31 U/L (ref 5–34)
Alkaline Phosphatase: 168 U/L — ABNORMAL HIGH (ref 40–150)
BILIRUBIN TOTAL: 1.09 mg/dL (ref 0.20–1.20)
BUN: 10.7 mg/dL (ref 7.0–26.0)
CALCIUM: 9 mg/dL (ref 8.4–10.4)
CO2: 26 mEq/L (ref 22–29)
CREATININE: 0.8 mg/dL (ref 0.7–1.3)
Chloride: 104 mEq/L (ref 98–109)
EGFR: 85 mL/min/{1.73_m2} — ABNORMAL LOW (ref >90)
Glucose: 93 mg/dl (ref 70–140)
Potassium: 4.4 mEq/L (ref 3.5–5.1)
Sodium: 136 mEq/L (ref 136–145)
TOTAL PROTEIN: 6.6 g/dL (ref 6.4–8.3)

## 2015-01-01 MED ORDER — IOHEXOL 300 MG/ML  SOLN
100.0000 mL | Freq: Once | INTRAMUSCULAR | Status: AC | PRN
  Administered 2015-01-01: 100 mL via INTRAVENOUS

## 2015-01-01 MED ORDER — IOHEXOL 300 MG/ML  SOLN: 25.0000 mL | INTRAMUSCULAR | Status: DC

## 2015-01-01 MED ORDER — IOHEXOL 300 MG/ML  SOLN
25.0000 mL | INTRAMUSCULAR | Status: AC
  Administered 2015-01-01: 25 mL via ORAL

## 2015-01-02 ENCOUNTER — Other Ambulatory Visit: Payer: Commercial Managed Care - HMO

## 2015-01-09 ENCOUNTER — Ambulatory Visit (HOSPITAL_BASED_OUTPATIENT_CLINIC_OR_DEPARTMENT_OTHER): Payer: Commercial Managed Care - HMO | Admitting: Internal Medicine

## 2015-01-09 ENCOUNTER — Encounter: Payer: Self-pay | Admitting: Internal Medicine

## 2015-01-09 ENCOUNTER — Telehealth: Payer: Self-pay | Admitting: Internal Medicine

## 2015-01-09 VITALS — BP 114/65 | HR 103 | Temp 97.9°F | Resp 17 | Ht 71.0 in | Wt 161.6 lb

## 2015-01-09 DIAGNOSIS — C3490 Malignant neoplasm of unspecified part of unspecified bronchus or lung: Secondary | ICD-10-CM | POA: Diagnosis not present

## 2015-01-09 DIAGNOSIS — C220 Liver cell carcinoma: Secondary | ICD-10-CM | POA: Diagnosis not present

## 2015-01-09 NOTE — Telephone Encounter (Signed)
per pof to sch pt appt-gave pt copy of sch-sch Dr [place] appt-gave cotrast-adv [facility] will calll to sch scans

## 2015-01-09 NOTE — Patient Instructions (Signed)
Smoking Cessation Quitting smoking is important to your health and has many advantages. However, it is not always easy to quit since nicotine is a very addictive drug. Oftentimes, people try 3 times or more before being able to quit. This document explains the best ways for you to prepare to quit smoking. Quitting takes hard work and a lot of effort, but you can do it. ADVANTAGES OF QUITTING SMOKING  You will live longer, feel better, and live better.  Your body will feel the impact of quitting smoking almost immediately.  Within 20 minutes, blood pressure decreases. Your pulse returns to its normal level.  After 8 hours, carbon monoxide levels in the blood return to normal. Your oxygen level increases.  After 24 hours, the chance of having a heart attack starts to decrease. Your breath, hair, and body stop smelling like smoke.  After 48 hours, damaged nerve endings begin to recover. Your sense of taste and smell improve.  After 72 hours, the body is virtually free of nicotine. Your bronchial tubes relax and breathing becomes easier.  After 2 to 12 weeks, lungs can hold more air. Exercise becomes easier and circulation improves.  The risk of having a heart attack, stroke, cancer, or lung disease is greatly reduced.  After 1 year, the risk of coronary heart disease is cut in half.  After 5 years, the risk of stroke falls to the same as a nonsmoker.  After 10 years, the risk of lung cancer is cut in half and the risk of other cancers decreases significantly.  After 15 years, the risk of coronary heart disease drops, usually to the level of a nonsmoker.  If you are pregnant, quitting smoking will improve your chances of having a healthy baby.  The people you live with, especially any children, will be healthier.  You will have extra money to spend on things other than cigarettes. QUESTIONS TO THINK ABOUT BEFORE ATTEMPTING TO QUIT You may want to talk about your answers with your  health care provider.  Why do you want to quit?  If you tried to quit in the past, what helped and what did not?  What will be the most difficult situations for you after you quit? How will you plan to handle them?  Who can help you through the tough times? Your family? Friends? A health care provider?  What pleasures do you get from smoking? What ways can you still get pleasure if you quit? Here are some questions to ask your health care provider:  How can you help me to be successful at quitting?  What medicine do you think would be best for me and how should I take it?  What should I do if I need more help?  What is smoking withdrawal like? How can I get information on withdrawal? GET READY  Set a quit date.  Change your environment by getting rid of all cigarettes, ashtrays, matches, and lighters in your home, car, or work. Do not let people smoke in your home.  Review your past attempts to quit. Think about what worked and what did not. GET SUPPORT AND ENCOURAGEMENT You have a better chance of being successful if you have help. You can get support in many ways.  Tell your family, friends, and coworkers that you are going to quit and need their support. Ask them not to smoke around you.  Get individual, group, or telephone counseling and support. Programs are available at local hospitals and health centers. Call   your local health department for information about programs in your area.  Spiritual beliefs and practices may help some smokers quit.  Download a "quit meter" on your computer to keep track of quit statistics, such as how long you have gone without smoking, cigarettes not smoked, and money saved.  Get a self-help book about quitting smoking and staying off tobacco. LEARN NEW SKILLS AND BEHAVIORS  Distract yourself from urges to smoke. Talk to someone, go for a walk, or occupy your time with a task.  Change your normal routine. Take a different route to work.  Drink tea instead of coffee. Eat breakfast in a different place.  Reduce your stress. Take a hot bath, exercise, or read a book.  Plan something enjoyable to do every day. Reward yourself for not smoking.  Explore interactive web-based programs that specialize in helping you quit. GET MEDICINE AND USE IT CORRECTLY Medicines can help you stop smoking and decrease the urge to smoke. Combining medicine with the above behavioral methods and support can greatly increase your chances of successfully quitting smoking.  Nicotine replacement therapy helps deliver nicotine to your body without the negative effects and risks of smoking. Nicotine replacement therapy includes nicotine gum, lozenges, inhalers, nasal sprays, and skin patches. Some may be available over-the-counter and others require a prescription.  Antidepressant medicine helps people abstain from smoking, but how this works is unknown. This medicine is available by prescription.  Nicotinic receptor partial agonist medicine simulates the effect of nicotine in your brain. This medicine is available by prescription. Ask your health care provider for advice about which medicines to use and how to use them based on your health history. Your health care provider will tell you what side effects to look out for if you choose to be on a medicine or therapy. Carefully read the information on the package. Do not use any other product containing nicotine while using a nicotine replacement product.  RELAPSE OR DIFFICULT SITUATIONS Most relapses occur within the first 3 months after quitting. Do not be discouraged if you start smoking again. Remember, most people try several times before finally quitting. You may have symptoms of withdrawal because your body is used to nicotine. You may crave cigarettes, be irritable, feel very hungry, cough often, get headaches, or have difficulty concentrating. The withdrawal symptoms are only temporary. They are strongest  when you first quit, but they will go away within 10-14 days. To reduce the chances of relapse, try to:  Avoid drinking alcohol. Drinking lowers your chances of successfully quitting.  Reduce the amount of caffeine you consume. Once you quit smoking, the amount of caffeine in your body increases and can give you symptoms, such as a rapid heartbeat, sweating, and anxiety.  Avoid smokers because they can make you want to smoke.  Do not let weight gain distract you. Many smokers will gain weight when they quit, usually less than 10 pounds. Eat a healthy diet and stay active. You can always lose the weight gained after you quit.  Find ways to improve your mood other than smoking. FOR MORE INFORMATION  www.smokefree.gov  Document Released: 04/01/2001 Document Revised: 08/22/2013 Document Reviewed: 07/17/2011 [employer] Patient Information 2015 [employer]. This information is not intended to replace advice given to you by your health care provider. Make sure you discuss any questions you have with your health care provider.  
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
## 2015-01-16 ENCOUNTER — Inpatient Hospital Stay: Admission: RE | Admit: 2015-01-16 | Payer: Self-pay | Source: Ambulatory Visit

## 2015-01-16 ENCOUNTER — Other Ambulatory Visit: Payer: Self-pay | Admitting: *Deleted

## 2015-01-16 ENCOUNTER — Telehealth: Payer: Self-pay | Admitting: *Deleted

## 2015-01-16 NOTE — Telephone Encounter (Signed)
Call from [facility] at GI regarding CT scan. Pt had CT scan 9/12, reviewed results with MD 9/20. POF for Scan to be ordered prior to next MD appt ( Dec)   Discussed with Alice to cancel scan and order. No further concerns.

## 2015-01-19 ENCOUNTER — Telehealth: Payer: Self-pay | Admitting: *Deleted

## 2015-01-19 NOTE — Telephone Encounter (Signed)
Oncology Nurse Navigator Documentation  Oncology Nurse Navigator Flowsheets 01/19/2015  Navigator Encounter Type Telephone/patient called me yesterday and left me a vm message to call.  I called him back.  He had questions about Dr. Worthy Flank appt.  I clarified.    I also asked him how he was doing on quitting smoking.  He stated he was doing better.  He is down to 10 cig a day.  I congratulated him on his accomplishments and encouraged him to continue to quit.  He said he was trying.    Patient Visit Type Follow-up  Treatment Phase Other  Barriers/Navigation Needs Encouragement.   Education Smoking cessation;Other  Interventions Education Method  Education Method Verbal  Time Spent with Patient 15

## 2015-01-22 ENCOUNTER — Other Ambulatory Visit: Payer: Self-pay | Admitting: Radiology

## 2015-01-22 ENCOUNTER — Other Ambulatory Visit (HOSPITAL_COMMUNITY): Payer: Self-pay | Admitting: Diagnostic Radiology

## 2015-01-22 DIAGNOSIS — C220 Liver cell carcinoma: Secondary | ICD-10-CM

## 2015-01-29 ENCOUNTER — Other Ambulatory Visit: Payer: Self-pay | Admitting: Gastroenterology

## 2015-01-30 ENCOUNTER — Other Ambulatory Visit: Payer: Self-pay | Admitting: Diagnostic Radiology

## 2015-01-30 ENCOUNTER — Ambulatory Visit
Admission: RE | Admit: 2015-01-30 | Discharge: 2015-01-30 | Disposition: A | Discharge status: Home / Self Care | Payer: Commercial Managed Care - HMO | Source: Ambulatory Visit | Attending: Diagnostic Radiology | Admitting: Diagnostic Radiology

## 2015-01-30 DIAGNOSIS — C220 Liver cell carcinoma: Secondary | ICD-10-CM

## 2015-01-30 LAB — AFP TUMOR MARKER: AFP-Tumor Marker: 17.1 ng/mL — ABNORMAL HIGH (ref <6.1)

## 2015-01-30 LAB — COMPREHENSIVE METABOLIC PANEL
ALT: 20 U/L (ref 9–46)
AST: 20 U/L (ref 10–35)
Albumin: 3.6 g/dL (ref 3.6–5.1)
Alkaline Phosphatase: 168 U/L — ABNORMAL HIGH (ref 40–115)
BILIRUBIN TOTAL: 0.6 mg/dL (ref 0.2–1.2)
BUN: 11 mg/dL (ref 7–25)
CO2: 29 mmol/L (ref 20–31)
CREATININE: 0.79 mg/dL (ref 0.70–1.18)
Calcium: 8.7 mg/dL (ref 8.6–10.3)
Chloride: 97 mmol/L — ABNORMAL LOW (ref 98–110)
GLUCOSE: 207 mg/dL — AB (ref 65–99)
Potassium: 4.1 mmol/L (ref 3.5–5.3)
SODIUM: 132 mmol/L — AB (ref 135–146)
Total Protein: 6.1 g/dL (ref 6.1–8.1)

## 2015-01-30 LAB — CBC
HCT: 37.6 % — ABNORMAL LOW (ref 39.0–52.0)
Hemoglobin: 13.4 g/dL (ref 13.0–17.0)
MCH: 32 pg (ref 26.0–34.0)
MCHC: 35.6 g/dL (ref 30.0–36.0)
MCV: 89.7 fL (ref 78.0–100.0)
MPV: 9.6 fL (ref 8.6–12.4)
PLATELETS: 314 10*3/uL (ref 150–400)
RBC: 4.19 MIL/uL — AB (ref 4.22–5.81)
RDW: 15.3 % (ref 11.5–15.5)
WBC: 8.2 10*3/uL (ref 4.0–10.5)

## 2015-01-30 LAB — PROTIME-INR
INR: 1 (ref <1.50)
Prothrombin Time: 13.3 seconds (ref 11.6–15.2)
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
## 2015-02-02 ENCOUNTER — Ambulatory Visit (HOSPITAL_COMMUNITY): Payer: Commercial Managed Care - HMO | Admitting: Psychiatry

## 2015-02-08 ENCOUNTER — Ambulatory Visit (HOSPITAL_COMMUNITY)
Admission: RE | Admit: 2015-02-08 | Discharge: 2015-02-08 | Disposition: A | Discharge status: Home / Self Care | Payer: Commercial Managed Care - HMO | Source: Ambulatory Visit | Attending: Diagnostic Radiology | Admitting: Diagnostic Radiology

## 2015-02-08 DIAGNOSIS — Z9049 Acquired absence of other specified parts of digestive tract: Secondary | ICD-10-CM | POA: Insufficient documentation

## 2015-02-08 DIAGNOSIS — I700 Atherosclerosis of aorta: Secondary | ICD-10-CM | POA: Diagnosis not present

## 2015-02-08 DIAGNOSIS — Z9889 Other specified postprocedural states: Secondary | ICD-10-CM | POA: Diagnosis not present

## 2015-02-08 DIAGNOSIS — N281 Cyst of kidney, acquired: Secondary | ICD-10-CM | POA: Diagnosis not present

## 2015-02-08 DIAGNOSIS — K8689 Other specified diseases of pancreas: Secondary | ICD-10-CM | POA: Insufficient documentation

## 2015-02-08 DIAGNOSIS — C220 Liver cell carcinoma: Secondary | ICD-10-CM | POA: Diagnosis not present

## 2015-02-08 MED ORDER — GADOXETATE DISODIUM 0.25 MMOL/ML IV SOLN
5.0000 mL | Freq: Once | INTRAVENOUS | Status: AC | PRN
  Administered 2015-02-08: 7 mL via INTRAVENOUS

## 2015-02-16 ENCOUNTER — Encounter (HOSPITAL_COMMUNITY): Payer: Self-pay | Admitting: *Deleted
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
## 2015-02-23 ENCOUNTER — Ambulatory Visit (HOSPITAL_COMMUNITY)
Admission: RE | Admit: 2015-02-23 | Discharge: 2015-02-23 | Disposition: A | Discharge status: Home / Self Care | Payer: Commercial Managed Care - HMO | Source: Ambulatory Visit | Attending: Gastroenterology | Admitting: Gastroenterology

## 2015-02-23 ENCOUNTER — Ambulatory Visit (HOSPITAL_COMMUNITY): Payer: Commercial Managed Care - HMO | Admitting: Anesthesiology

## 2015-02-23 ENCOUNTER — Encounter (HOSPITAL_COMMUNITY)
Admission: RE | Disposition: A | Discharge status: Home / Self Care | Payer: Self-pay | Source: Ambulatory Visit | Attending: Gastroenterology

## 2015-02-23 ENCOUNTER — Encounter (HOSPITAL_COMMUNITY): Payer: Self-pay | Admitting: *Deleted

## 2015-02-23 DIAGNOSIS — K7460 Unspecified cirrhosis of liver: Secondary | ICD-10-CM | POA: Diagnosis not present

## 2015-02-23 DIAGNOSIS — C220 Liver cell carcinoma: Secondary | ICD-10-CM | POA: Diagnosis not present

## 2015-02-23 DIAGNOSIS — K219 Gastro-esophageal reflux disease without esophagitis: Secondary | ICD-10-CM | POA: Diagnosis not present

## 2015-02-23 DIAGNOSIS — Z9049 Acquired absence of other specified parts of digestive tract: Secondary | ICD-10-CM | POA: Insufficient documentation

## 2015-02-23 DIAGNOSIS — R0602 Shortness of breath: Secondary | ICD-10-CM | POA: Insufficient documentation

## 2015-02-23 DIAGNOSIS — F419 Anxiety disorder, unspecified: Secondary | ICD-10-CM | POA: Insufficient documentation

## 2015-02-23 DIAGNOSIS — I69354 Hemiplegia and hemiparesis following cerebral infarction affecting left non-dominant side: Secondary | ICD-10-CM | POA: Diagnosis not present

## 2015-02-23 DIAGNOSIS — R05 Cough: Secondary | ICD-10-CM | POA: Insufficient documentation

## 2015-02-23 DIAGNOSIS — E119 Type 2 diabetes mellitus without complications: Secondary | ICD-10-CM | POA: Diagnosis not present

## 2015-02-23 DIAGNOSIS — Z89021 Acquired absence of right finger(s): Secondary | ICD-10-CM | POA: Insufficient documentation

## 2015-02-23 DIAGNOSIS — F17210 Nicotine dependence, cigarettes, uncomplicated: Secondary | ICD-10-CM | POA: Diagnosis not present

## 2015-02-23 DIAGNOSIS — R933 Abnormal findings on diagnostic imaging of other parts of digestive tract: Secondary | ICD-10-CM | POA: Insufficient documentation

## 2015-02-23 DIAGNOSIS — C799 Secondary malignant neoplasm of unspecified site: Secondary | ICD-10-CM | POA: Diagnosis not present

## 2015-02-23 DIAGNOSIS — C3490 Malignant neoplasm of unspecified part of unspecified bronchus or lung: Secondary | ICD-10-CM | POA: Insufficient documentation

## 2015-02-23 DIAGNOSIS — Z9221 Personal history of antineoplastic chemotherapy: Secondary | ICD-10-CM | POA: Insufficient documentation

## 2015-02-23 HISTORY — PX: EUS: SHX5427

## 2015-02-23 HISTORY — PX: ESOPHAGOGASTRODUODENOSCOPY (EGD) WITH PROPOFOL: SHX5813

## 2015-02-23 HISTORY — PX: FINE NEEDLE ASPIRATION: SHX5430

## 2015-02-23 LAB — GLUCOSE, CAPILLARY: Glucose-Capillary: 112 mg/dL — ABNORMAL HIGH (ref 65–99)

## 2015-02-23 SURGERY — ULTRASOUND, UPPER GI TRACT, ENDOSCOPIC
Anesthesia: Monitor Anesthesia Care

## 2015-02-23 MED ORDER — PROPOFOL 10 MG/ML IV BOLUS
INTRAVENOUS | Status: AC
  Filled 2015-02-23: qty 20

## 2015-02-23 MED ORDER — SODIUM CHLORIDE 0.9 % IV SOLN
INTRAVENOUS | Status: DC
Start: 2015-02-23 — End: 2015-02-23

## 2015-02-23 MED ORDER — LACTATED RINGERS IV SOLN
INTRAVENOUS | Status: DC | PRN
  Administered 2015-02-23: 12:00:00 via INTRAVENOUS

## 2015-02-23 MED ORDER — PROPOFOL 500 MG/50ML IV EMUL
INTRAVENOUS | Status: DC | PRN
  Administered 2015-02-23: 150 ug/kg/min via INTRAVENOUS

## 2015-02-23 MED ORDER — LACTATED RINGERS IV SOLN
Freq: Once | INTRAVENOUS | Status: AC
  Administered 2015-02-23: 1000 mL via INTRAVENOUS

## 2015-02-23 SURGICAL SUPPLY — 15 items

## 2015-02-23 NOTE — Anesthesia Preprocedure Evaluation (Addendum)
Anesthesia Evaluation  Patient identified by MRN, date of birth, ID band Patient awake    Reviewed: Allergy & Precautions, NPO status , Patient's Chart, lab work & pertinent test results  Airway Mallampati: II  TM Distance: >3 FB Neck ROM: Full    Dental  (+) Upper Dentures, Lower Dentures   Pulmonary shortness of breath, Current Smoker,     + wheezing      Cardiovascular  Rhythm:Regular Rate:Normal     Neuro/Psych PSYCHIATRIC DISORDERS Anxiety CVA, Residual Symptoms    GI/Hepatic GERD  ,(+)     substance abuse  alcohol use, Hepatitis -, Toxin Related  Endo/Other  diabetes, Type 2  Renal/GU   negative genitourinary   Musculoskeletal negative musculoskeletal ROS (+)   Abdominal   Peds  Hematology negative hematology ROS (+)   Anesthesia Other Findings   Reproductive/Obstetrics negative OB ROS                            Lab Results  Component Value Date   WBC 8.2 01/29/2015   HGB 13.4 01/29/2015   HCT 37.6* 01/29/2015   MCV 89.7 01/29/2015   PLT 314 01/29/2015   Lab Results  Component Value Date   CREATININE 0.79 01/29/2015   BUN 11 01/29/2015   NA 132* 01/29/2015   K 4.1 01/29/2015   CL 97* 01/29/2015   CO2 29 01/29/2015  ; Lab Results  Component Value Date   INR 1.00 01/29/2015   INR 1.06 11/26/2014   INR 1.07 11/24/2014    EKG: normal sinus rhythm.  Anesthesia Physical Anesthesia Plan  ASA: III  Anesthesia Plan: MAC   Post-op Pain Management:    Induction: Intravenous  Airway Management Planned: Natural Airway and Nasal Cannula  Additional Equipment:   Intra-op Plan:   Post-operative Plan:   Informed Consent: I have reviewed the patients History and Physical, chart, labs and discussed the procedure including the risks, benefits and alternatives for the proposed anesthesia with the patient or authorized representative who has indicated his/her  understanding and acceptance.     Plan Discussed with: CRNA  Anesthesia Plan Comments:         Anesthesia Quick Evaluation

## 2015-02-23 NOTE — Op Note (Signed)
[facility] [place], [id number]   UPPER ENDOSCOPIC ULTRASOUND PROCEDURE REPORT     EXAM DATE: 02/23/2015  PATIENT NAME:          Austin [name], Austin [name]          MR#: [id number] BIRTHDATE:       [birth year]     VISIT #:     [id number] ATTENDING:     Carol Ada, MD     STATUS:     outpatient ASSISTANT:      Laverta Baltimore and Lindaann Slough MD: ASA CLASS:        Class III  INDICATIONS:  The patient is a 81 yr old male here for a lower endoscopic ultrasound due to abnormal imaging - ? pancreatic head mass. PROCEDURE PERFORMED:     Upper EUS w/FNA  MEDICATIONS:     Monitored anesthesia care  CONSENT: The patient understands the risks and benefits of the procedure and understands that these risks include, but are not limited to: sedation, allergic reaction, infection, perforation and/or bleeding. Alternative means of evaluation and treatment include, among others: physical exam, x-rays, and/or surgical intervention. The patient elects to proceed with this endoscopic procedure.  DESCRIPTION OF PROCEDURE: During intra-op preparation period all mechanical & medical equipment was checked for proper function. Hand hygiene and appropriate measures for infection prevention was taken. After the risks, benefits and alternatives of the procedure were thoroughly explained, Informed consent was verified, confirmed and timeout was successfully executed by the treatment team. The patient was then placed in the left, lateral, decubitus position and IV sedation was administered. Throughout the procedure, the patients blood pressure, pulse and oxygen saturations were monitored continuously. Under direct visualization, the    was introduced through the mouth and advanced to the second portion of the duodenum.  Water was used as necessary to provide an acoustic interface. The pulse, BP, and O2 saturation were monitored and documented by the physician  and nursing staff throughout the procedure. Upon completion of the imaging, water was removed and the patient was then discharged to recovery in stable condition with the appropriate post procedure care. Estimated blood loss is zero unless otherwise noted in this procedure report.   FINDINGS: The celiac axis was negative for any LAD.  The PD in the pancreatic body was mildly dilated, but the PD in the neck of the pancreas was measured at 7 mm.  It was enlarged and ectatic.  The surrounding tissue around the PD was abnormal in its enhancement, i.e., it was hypoechoic.  The prior metallic stent was identified grossly and through ultrasound.  In the distal portion of the CBD there was a hypoechoic lesion measuring 2 cm adjacent to the stent. One pass of the 25 gauge FNA was performed, but no tissue was obtained.  It felt firm.  A second attempt was tried, but abandonded as I was not certain if this was truly a lesion.  No other abnomalities identified.    ADVERSE EVENTS:     There were no immediate complications. IMPRESSIONS:     1) ? head of the pancreas lesion.  RECOMMENDATIONS:     1) Follow up FNA.  ___________________________________ Carol Ada, MD eSigned:  Carol Ada, MD 02/23/2015 1:29 PM   cc:      PATIENT NAME:  Austin [name], Austin [name] MR#: [id number]

## 2015-02-23 NOTE — Discharge Instructions (Signed)

## 2015-02-23 NOTE — Transfer of Care (Signed)
Immediate Anesthesia Transfer of Care Note  Patient: Austin [name]  Procedure(s) Performed: Procedure(s): FULL UPPER ENDOSCOPIC ULTRASOUND (EUS) RADIAL (N/A) FINE NEEDLE ASPIRATION (FNA) LINEAR (N/A) ESOPHAGOGASTRODUODENOSCOPY (EGD) WITH PROPOFOL (N/A)  Patient Location: PACU and Endoscopy Unit  Anesthesia Type:MAC  Level of Consciousness: sedated and patient cooperative  Airway & Oxygen Therapy: Patient Spontanous Breathing and Patient connected to nasal cannula oxygen  Post-op Assessment: Report given to RN and Post -op Vital signs reviewed and stable  Post vital signs: Reviewed and stable  Last Vitals:  Filed Vitals:   02/23/15 1143  BP: 120/64  Temp: 36.5 C  Resp: 10    Complications: No apparent anesthesia complications

## 2015-02-23 NOTE — Interval H&P Note (Signed)
History and Physical Interval Note:  02/23/2015 12:30 PM  Austin [name]  has presented today for surgery, with the diagnosis of pancreatic mass  The various methods of treatment have been discussed with the patient and family. After consideration of risks, benefits and other options for treatment, the patient has consented to  Procedure(s): FULL UPPER ENDOSCOPIC ULTRASOUND (EUS) RADIAL (N/A) FINE NEEDLE ASPIRATION (FNA) LINEAR (N/A) ESOPHAGOGASTRODUODENOSCOPY (EGD) WITH PROPOFOL (N/A) as a surgical intervention .  The patient's history has been reviewed, patient examined, no change in status, stable for surgery.  I have reviewed the patient's chart and labs.  Questions were answered to the patient's satisfaction.     [name] D

## 2015-02-26 ENCOUNTER — Encounter (HOSPITAL_COMMUNITY): Payer: Self-pay | Admitting: Gastroenterology

## 2015-02-26 NOTE — Anesthesia Postprocedure Evaluation (Signed)
  Anesthesia Post-op Note  Patient: Austin [name]  Procedure(s) Performed: Procedure(s): FULL UPPER ENDOSCOPIC ULTRASOUND (EUS) RADIAL (N/A) FINE NEEDLE ASPIRATION (FNA) LINEAR (N/A) ESOPHAGOGASTRODUODENOSCOPY (EGD) WITH PROPOFOL (N/A)  Patient Location: Endoscopy Unit  Anesthesia Type:MAC  Level of Consciousness: awake, alert  and oriented  Airway and Oxygen Therapy: Patient Spontanous Breathing  Post-op Pain: none  Post-op Assessment: Post-op Vital signs reviewed and Patient's Cardiovascular Status Stable              Post-op Vital Signs: Reviewed  Last Vitals:  Filed Vitals:   02/23/15 1403  BP: 127/74  Pulse: 88  Temp:   Resp: 18    Complications: No apparent anesthesia complications

## 2015-04-10 ENCOUNTER — Encounter (HOSPITAL_COMMUNITY): Payer: Self-pay

## 2015-04-10 ENCOUNTER — Other Ambulatory Visit (HOSPITAL_BASED_OUTPATIENT_CLINIC_OR_DEPARTMENT_OTHER): Payer: Commercial Managed Care - HMO

## 2015-04-10 ENCOUNTER — Ambulatory Visit (HOSPITAL_COMMUNITY)
Admission: RE | Admit: 2015-04-10 | Discharge: 2015-04-10 | Disposition: A | Discharge status: Home / Self Care | Payer: Commercial Managed Care - HMO | Source: Ambulatory Visit | Attending: Internal Medicine | Admitting: Internal Medicine

## 2015-04-10 DIAGNOSIS — C220 Liver cell carcinoma: Secondary | ICD-10-CM

## 2015-04-10 DIAGNOSIS — C3490 Malignant neoplasm of unspecified part of unspecified bronchus or lung: Secondary | ICD-10-CM | POA: Diagnosis not present

## 2015-04-10 DIAGNOSIS — C3402 Malignant neoplasm of left main bronchus: Secondary | ICD-10-CM | POA: Insufficient documentation

## 2015-04-10 LAB — COMPREHENSIVE METABOLIC PANEL
ALBUMIN: 3.5 g/dL (ref 3.5–5.0)
ALK PHOS: 177 U/L — AB (ref 40–150)
ALT: 29 U/L (ref 0–55)
ANION GAP: 8 meq/L (ref 3–11)
AST: 23 U/L (ref 5–34)
BILIRUBIN TOTAL: 0.52 mg/dL (ref 0.20–1.20)
BUN: 9.7 mg/dL (ref 7.0–26.0)
CALCIUM: 9.2 mg/dL (ref 8.4–10.4)
CO2: 27 mEq/L (ref 22–29)
Chloride: 101 mEq/L (ref 98–109)
Creatinine: 1 mg/dL (ref 0.7–1.3)
EGFR: 73 mL/min/{1.73_m2} — AB (ref >90)
GLUCOSE: 137 mg/dL (ref 70–140)
Potassium: 3.9 mEq/L (ref 3.5–5.1)
Sodium: 136 mEq/L (ref 136–145)
TOTAL PROTEIN: 7.1 g/dL (ref 6.4–8.3)

## 2015-04-10 LAB — CBC WITH DIFFERENTIAL/PLATELET
BASO%: 1 % (ref 0.0–2.0)
Basophils Absolute: 0.1 10*3/uL (ref 0.0–0.1)
EOS%: 1.8 % (ref 0.0–7.0)
Eosinophils Absolute: 0.2 10*3/uL (ref 0.0–0.5)
HEMATOCRIT: 43.1 % (ref 38.4–49.9)
HEMOGLOBIN: 14.3 g/dL (ref 13.0–17.1)
LYMPH#: 2.5 10*3/uL (ref 0.9–3.3)
LYMPH%: 24.9 % (ref 14.0–49.0)
MCH: 31.4 pg (ref 27.2–33.4)
MCHC: 33.2 g/dL (ref 32.0–36.0)
MCV: 94.4 fL (ref 79.3–98.0)
MONO#: 1.2 10*3/uL — AB (ref 0.1–0.9)
MONO%: 11.9 % (ref 0.0–14.0)
NEUT#: 6.1 10*3/uL (ref 1.5–6.5)
NEUT%: 60.4 % (ref 39.0–75.0)
PLATELETS: 231 10*3/uL (ref 140–400)
RBC: 4.56 10*6/uL (ref 4.20–5.82)
RDW: 15.4 % — AB (ref 11.0–14.6)
WBC: 10.1 10*3/uL (ref 4.0–10.3)

## 2015-04-10 MED ORDER — IOHEXOL 300 MG/ML  SOLN
75.0000 mL | Freq: Once | INTRAMUSCULAR | Status: AC | PRN
  Administered 2015-04-10: 75 mL via INTRAVENOUS

## 2015-04-12 ENCOUNTER — Telehealth: Payer: Self-pay | Admitting: Internal Medicine

## 2015-04-12 ENCOUNTER — Ambulatory Visit (HOSPITAL_BASED_OUTPATIENT_CLINIC_OR_DEPARTMENT_OTHER): Payer: Commercial Managed Care - HMO | Admitting: Internal Medicine

## 2015-04-12 VITALS — BP 131/71 | HR 117 | Temp 98.4°F | Resp 20 | Ht 71.0 in | Wt 157.1 lb

## 2015-04-12 DIAGNOSIS — K869 Disease of pancreas, unspecified: Secondary | ICD-10-CM

## 2015-04-12 DIAGNOSIS — C220 Liver cell carcinoma: Secondary | ICD-10-CM

## 2015-04-12 DIAGNOSIS — Z85118 Personal history of other malignant neoplasm of bronchus and lung: Secondary | ICD-10-CM

## 2015-04-12 DIAGNOSIS — Z720 Tobacco use: Secondary | ICD-10-CM

## 2015-04-12 DIAGNOSIS — C3491 Malignant neoplasm of unspecified part of right bronchus or lung: Secondary | ICD-10-CM

## 2015-04-12 NOTE — Telephone Encounter (Signed)
Gave pt appts for 3/15 + 3/22. Also advised pt c-sched will call to sched ct scan. Gave contrast.

## 2015-04-12 NOTE — Patient Instructions (Signed)
Smoking Cessation, Tips for Success If you are ready to quit smoking, congratulations! You have chosen to help yourself be healthier. Cigarettes bring nicotine, tar, carbon monoxide, and other irritants into your body. Your lungs, heart, and blood vessels will be able to work better without these poisons. There are many different ways to quit smoking. Nicotine gum, nicotine patches, a nicotine inhaler, or nicotine nasal spray can help with physical craving. Hypnosis, support groups, and medicines help break the habit of smoking. WHAT THINGS CAN I DO TO MAKE QUITTING EASIER?  Here are some tips to help you quit for good:  Pick a date when you will quit smoking completely. Tell all of your friends and family about your plan to quit on that date.  Do not try to slowly cut down on the number of cigarettes you are smoking. Pick a quit date and quit smoking completely starting on that day.  Throw away all cigarettes.   Clean and remove all ashtrays from your home, work, and car.  On a card, write down your reasons for quitting. Carry the card with you and read it when you get the urge to smoke.  Cleanse your body of nicotine. Drink enough water and fluids to keep your urine clear or pale yellow. Do this after quitting to flush the nicotine from your body.  Learn to predict your moods. Do not let a bad situation be your excuse to have a cigarette. Some situations in your life might tempt you into wanting a cigarette.  Never have "just one" cigarette. It leads to wanting another and another. Remind yourself of your decision to quit.  Change habits associated with smoking. If you smoked while driving or when feeling stressed, try other activities to replace smoking. Stand up when drinking your coffee. Brush your teeth after eating. Sit in a different chair when you read the paper. Avoid alcohol while trying to quit, and try to drink fewer caffeinated beverages. Alcohol and caffeine may urge you to  smoke.  Avoid foods and drinks that can trigger a desire to smoke, such as sugary or spicy foods and alcohol.  Ask people who smoke not to smoke around you.  Have something planned to do right after eating or having a cup of coffee. For example, plan to take a walk or exercise.  Try a relaxation exercise to calm you down and decrease your stress. Remember, you may be tense and nervous for the first 2 weeks after you quit, but this will pass.  Find new activities to keep your hands busy. Play with a pen, coin, or rubber band. Doodle or draw things on paper.  Brush your teeth right after eating. This will help cut down on the craving for the taste of tobacco after meals. You can also try mouthwash.   Use oral substitutes in place of cigarettes. Try using lemon drops, carrots, cinnamon sticks, or chewing gum. Keep them handy so they are available when you have the urge to smoke.  When you have the urge to smoke, try deep breathing.  Designate your home as a nonsmoking area.  If you are a heavy smoker, ask your health care provider about a prescription for nicotine chewing gum. It can ease your withdrawal from nicotine.  Reward yourself. Set aside the cigarette money you save and buy yourself something nice.  Look for support from others. Join a support group or smoking cessation program. Ask someone at home or at work to help you with your plan   to quit smoking.  Always ask yourself, "Do I need this cigarette or is this just a reflex?" Tell yourself, "Today, I choose not to smoke," or "I do not want to smoke." You are reminding yourself of your decision to quit.  Do not replace cigarette smoking with electronic cigarettes (commonly called e-cigarettes). The safety of e-cigarettes is unknown, and some may contain harmful chemicals.  If you relapse, do not give up! Plan ahead and think about what you will do the next time you get the urge to smoke. HOW WILL I FEEL WHEN I QUIT SMOKING? You  may have symptoms of withdrawal because your body is used to nicotine (the addictive substance in cigarettes). You may crave cigarettes, be irritable, feel very hungry, cough often, get headaches, or have difficulty concentrating. The withdrawal symptoms are only temporary. They are strongest when you first quit but will go away within 10-14 days. When withdrawal symptoms occur, stay in control. Think about your reasons for quitting. Remind yourself that these are signs that your body is healing and getting used to being without cigarettes. Remember that withdrawal symptoms are easier to treat than the major diseases that smoking can cause.  Even after the withdrawal is over, expect periodic urges to smoke. However, these cravings are generally short lived and will go away whether you smoke or not. Do not smoke! WHAT RESOURCES ARE AVAILABLE TO HELP ME QUIT SMOKING? Your health care provider can direct you to community resources or hospitals for support, which may include:  Group support.  Education.  Hypnosis.  Therapy.   This information is not intended to replace advice given to you by your health care provider. Make sure you discuss any questions you have with your health care provider.   Document Released: 01/04/2004 Document Revised: 04/28/2014 Document Reviewed: 09/23/2012 Elsevier Interactive Patient Education 2016 Elsevier Inc.  
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
## 2015-04-14 ENCOUNTER — Encounter: Payer: Self-pay | Admitting: Internal Medicine

## 2015-06-18 ENCOUNTER — Encounter: Payer: Self-pay | Admitting: Family Medicine

## 2015-06-18 ENCOUNTER — Ambulatory Visit (INDEPENDENT_AMBULATORY_CARE_PROVIDER_SITE_OTHER): Payer: Commercial Managed Care - HMO | Admitting: Family Medicine

## 2015-06-18 VITALS — BP 134/57 | HR 89 | Temp 97.5°F | Ht 69.0 in | Wt 149.0 lb

## 2015-06-18 DIAGNOSIS — E138 Other specified diabetes mellitus with unspecified complications: Secondary | ICD-10-CM | POA: Diagnosis not present

## 2015-06-18 LAB — COMPLETE METABOLIC PANEL WITH GFR
ALBUMIN: 3.6 g/dL (ref 3.6–5.1)
ALK PHOS: 185 U/L — AB (ref 40–115)
ALT: 20 U/L (ref 9–46)
AST: 21 U/L (ref 10–35)
BILIRUBIN TOTAL: 0.4 mg/dL (ref 0.2–1.2)
BUN: 10 mg/dL (ref 7–25)
CO2: 26 mmol/L (ref 20–31)
CREATININE: 0.84 mg/dL (ref 0.70–1.11)
Calcium: 9 mg/dL (ref 8.6–10.3)
Chloride: 96 mmol/L — ABNORMAL LOW (ref 98–110)
GFR, Est Non African American: 83 mL/min (ref >=60)
GLUCOSE: 365 mg/dL — AB (ref 65–99)
Potassium: 4.1 mmol/L (ref 3.5–5.3)
Sodium: 131 mmol/L — ABNORMAL LOW (ref 135–146)
TOTAL PROTEIN: 6.8 g/dL (ref 6.1–8.1)

## 2015-06-18 LAB — LIPID PANEL
CHOL/HDL RATIO: 4.1 ratio (ref <=5.0)
CHOLESTEROL: 115 mg/dL — AB (ref 125–200)
HDL: 28 mg/dL — ABNORMAL LOW (ref >=40)
LDL Cholesterol: 62 mg/dL (ref <130)
Triglycerides: 126 mg/dL (ref <150)
VLDL: 25 mg/dL (ref <30)

## 2015-06-18 NOTE — Progress Notes (Signed)
Patient ID: Austin [name], male   DOB: [birth year], 82 y.o.   MRN: [id number]   Austin [name], is a 82 y.o. male  TMY:[id number]  LID:[id number]  DOB - 06/22/1933  CC:  Chief Complaint  Patient presents with  . yearly follow up    has seen Dr Rodena Piety here in the past, sees Dr Inda Merlin for lung cancer, would like to discuss meds for pain in both sides       HPI: Austin [name] is a 83 y.o. male here for follow-up. Patient has a history of lung cancer hepatocellular carcinoma and is followed by oncology.  He reports completing chemotherapy about 3 years ago.He has an appointment in about a month. He has cataracts with lens implant on right.He does still smoke and reports morning cough. He has a history of kidney stones, CVA, GERD, Alcoholism (he report no current alcohol use).. He also has experienced a stroke.He has a history of type II  Diabetes and states he was told at his last office visit to stop his metformin.His major complaint today is of bilateral flank pain that is fairly consistent for a couple of months. There is some increase in discomfort with bending, twisting. He does report continued wt loss despite eating ok.  Health Maintenance:Tdap less than 10 years ago. Influenza a pneumonia OTD. He reports having a colonoscopy about 6 months ago and an eye exam about 1 month ago.   Allergies  Allergen Reactions  . Penicillins Swelling    Has patient had a PCN reaction causing immediate rash, facial/tongue/throat swelling, SOB or lightheadedness with hypotension: yes Has patient had a PCN reaction causing severe rash involving mucus membranes or skin necrosis: No Has patient had a PCN reaction that required hospitalization No Has patient had a PCN reaction occurring within the last 10 years: No If all of the above answers are "NO", then may proceed with Cephalosporin use.   Past Medical History  Diagnosis Date  . Blood transfusion   . Cataract     right eye, lens implant left with s/p  cataract surgery 9'16  . Stroke [facility])     1980's with left sided weakness - has currently improved  . Shortness of breath     WITH COUGH pt states has had since receiving chemo txs  . Kidney stones     hx of   . GERD (gastroesophageal reflux disease)     pt states not using medication   . Status post chemotherapy     last tx approx 3 years ago   . Alcoholism ([place])     hx of quit in 2000 after inpt therapy   . Shaking     hands bilat more per right   . Trauma     right fifth finger / amputation   . Hepatic cirrhosis ([place])   . Diabetes type 2, controlled ([place]) 09/07/2014  . Cancer ([place]) 06/2011    lung; hepatocellular cancer 08/2014  . Cancer of lung [facility])    Current Outpatient Prescriptions on File Prior to Visit  Medication Sig Dispense Refill  . aspirin 81 MG tablet Take 81 mg by mouth every morning.     . feeding supplement (ENSURE COMPLETE) LIQD Take 237 mLs by mouth 2 (two) times daily between meals. (Patient taking differently: Take 237 mLs by mouth daily. ) 30 Bottle 0  . glucose monitoring kit (FREESTYLE) monitoring kit 1 each by Does not apply route as needed for other. Check your blood sugars twice a  day. Dispense 60 strips/month, lancets. 1 each 1  . albuterol (PROAIR HFA) 108 (90 BASE) MCG/ACT inhaler Inhale 1-2 puffs into the lungs every 4 (four) hours as needed for wheezing or shortness of breath. (Patient not taking: Reported on 06/18/2015) 6.7 g 5  . BESIVANCE 0.6 % SUSP Place 1 drop into the left eye 2 (two) times daily. Reported on 06/18/2015    . DUREZOL 0.05 % EMUL Place 1 drop into the left eye 2 (two) times daily. Reported on 06/18/2015    . ILEVRO 0.3 % ophthalmic suspension Place 1 drop into the left eye 2 (two) times daily. Reported on 06/18/2015     No current facility-administered medications on file prior to visit.   Family History  Problem Relation Age of Onset  . Anesthesia problems Neg Hx   . Hypotension Neg Hx   . Malignant hyperthermia Neg Hx   .  Pseudochol deficiency Neg Hx   . Cardiomyopathy Mother     Rheumatic heart Disease  . Arthritis Sister   . Diabetes Brother   . Alcohol abuse Sister    Social History   Social History  . Marital Status: Single    Spouse Name: N/A  . Number of Children: N/A  . Years of Education: N/A   Occupational History  . Retired     Architect   Social History Main Topics  . Smoking status: Current Every Day Smoker -- 0.50 packs/day for 55 years    Types: Cigarettes  . Smokeless tobacco: Never Used     Comment: 10 cigs a day  . Alcohol Use: No     Comment: quit 2000   . Drug Use: No  . Sexual Activity: Not Currently   Other Topics Concern  . Not on file   Social History Narrative    Review of Systems: Constitutional: Negative for fever, chills, appetite change. Positive for  weight loss,  Fatigue. Skin: Negative for rashes or lesions of concern. HENT: Negative for ear pain, ear discharge.nose bleeds Eyes: Negative for pain, discharge, redness, itching and visual disturbance. Neck: Negative for pain, stiffness Respiratory: Positive for cough, shortness of breath in AM Cardiovascular: Negative for chest pain, palpitations and leg swelling. Gastrointestinal: Negative for abdominal pain, nausea, vomiting, diarrhea, constipations Genitourinary: Negative for dysuria, urgency, frequency, hematuria,  Musculoskeletal: Positive for bilateral back pain in the CVA area. He also has some hip and knee pain. Negative for  joint  swelling, and gait problem.Negative for weakness. Neurological: Negative for dizziness, tremors, seizures, syncope,   light-headedness, numbness and headaches.  Hematological: Negative for easy bleeding. Positive for easy bleeding Psychiatric/Behavioral: Positive for expected epression, anxiety concerning his condition   Objective:   Filed Vitals:   06/18/15 1127  BP: 134/57  Pulse: 89  Temp: 97.5 F (36.4 C)    Physical Exam: Constitutional: Patient is a  thin man in no acute distress.  HENT: Normocephalic, atraumatic, External right and left ear normal. Oropharynx is clear and moist.  Eyes: Conjunctivae and EOM are normal. PERRLA, no scleral icterus. Neck: Normal ROM. Neck supple. No lymphadenopathy, No thyromegaly. CVS: RRR, S1/S2 +, no murmurs, no gallops, no rubs Pulmonary: Effort and breath sounds normal, no stridor, rhonchi, wheezes, rales.  Abdominal: Soft. Normoactive BS,, no distension,  rebound or guarding. There is diffuse upper abd tenderness (mild) Musculoskeletal: Normal range of motion. No edema. There is bilateral CVA tenderness Neuro: Alert.Normal muscle tone coordination. Non-focal Skin: Skin is warm and dry. No rash noted. Not diaphoretic. No  erythema. No pallor. Psychiatric: Normal mood and affect. Behavior, judgment, thought content normal.  Lab Results  Component Value Date   WBC 10.1 04/10/2015   HGB 14.3 04/10/2015   HCT 43.1 04/10/2015   MCV 94.4 04/10/2015   PLT 231 04/10/2015   Lab Results  Component Value Date   CREATININE 1.0 04/10/2015   BUN 9.7 04/10/2015   NA 136 04/10/2015   K 3.9 04/10/2015   CL 97* 01/29/2015   CO2 27 04/10/2015    Lab Results  Component Value Date   HGBA1C 8.1* 10/20/2014   Lipid Panel     Component Value Date/Time   CHOL 126 08/14/2014 1055   TRIG 156* 08/14/2014 1055   HDL 25* 08/14/2014 1055   CHOLHDL 5.0 08/14/2014 1055   VLDL 31 08/14/2014 1055   LDLCALC 70 08/14/2014 1055       Assessment and plan:   1. Diabetes mellitus of other type with complication ([place]) - CMP -CBC -Lipid -A1C/  2. Lung and Hepatocellular Cancer -Follow-up with oncology as planned  3. Back pain -Will advise after review of liver and kidney function    Return in about 6 months (around 12/16/2015).  The patient was given clear instructions to go to ER or return to medical center if symptoms don't improve, worsen or new problems develop. The patient verbalized understanding.     Micheline Chapman FNP  06/18/2015, 12:24 PM

## 2015-06-18 NOTE — Patient Instructions (Signed)
Will recommend something for back pain once I see your lab work. Follow-up with oncology as planned.

## 2015-06-19 LAB — MICROALBUMIN / CREATININE URINE RATIO
Creatinine, Urine: 51 mg/dL (ref 20–370)
MICROALB/CREAT RATIO: 76 ug/mg{creat} — AB (ref <30)
Microalb, Ur: 3.9 mg/dL

## 2015-06-19 LAB — HEMOGLOBIN A1C
Hgb A1c MFr Bld: 7.6 % — ABNORMAL HIGH (ref <5.7)
Mean Plasma Glucose: 171 mg/dL — ABNORMAL HIGH (ref <117)

## 2015-06-21 NOTE — Progress Notes (Signed)
Pt advised of lab results and FNP's advise. He voiced understanding.

## 2015-07-04 ENCOUNTER — Other Ambulatory Visit: Payer: Self-pay

## 2015-07-11 ENCOUNTER — Ambulatory Visit (HOSPITAL_BASED_OUTPATIENT_CLINIC_OR_DEPARTMENT_OTHER): Payer: Commercial Managed Care - HMO | Admitting: Internal Medicine

## 2015-07-11 ENCOUNTER — Telehealth: Payer: Self-pay | Admitting: Internal Medicine

## 2015-07-11 ENCOUNTER — Ambulatory Visit (HOSPITAL_BASED_OUTPATIENT_CLINIC_OR_DEPARTMENT_OTHER): Payer: Commercial Managed Care - HMO

## 2015-07-11 ENCOUNTER — Encounter (HOSPITAL_COMMUNITY): Payer: Self-pay

## 2015-07-11 ENCOUNTER — Ambulatory Visit (HOSPITAL_COMMUNITY)
Admission: RE | Admit: 2015-07-11 | Discharge: 2015-07-11 | Disposition: A | Discharge status: Home / Self Care | Payer: Commercial Managed Care - HMO | Source: Ambulatory Visit | Attending: Internal Medicine | Admitting: Internal Medicine

## 2015-07-11 ENCOUNTER — Encounter: Payer: Self-pay | Admitting: Internal Medicine

## 2015-07-11 VITALS — BP 126/62 | HR 90 | Temp 97.6°F | Resp 16 | Ht 69.0 in | Wt 146.4 lb

## 2015-07-11 DIAGNOSIS — C220 Liver cell carcinoma: Secondary | ICD-10-CM

## 2015-07-11 DIAGNOSIS — J439 Emphysema, unspecified: Secondary | ICD-10-CM | POA: Diagnosis not present

## 2015-07-11 DIAGNOSIS — R918 Other nonspecific abnormal finding of lung field: Secondary | ICD-10-CM | POA: Insufficient documentation

## 2015-07-11 DIAGNOSIS — Z9689 Presence of other specified functional implants: Secondary | ICD-10-CM | POA: Diagnosis not present

## 2015-07-11 DIAGNOSIS — Z720 Tobacco use: Secondary | ICD-10-CM

## 2015-07-11 DIAGNOSIS — C3491 Malignant neoplasm of unspecified part of right bronchus or lung: Secondary | ICD-10-CM

## 2015-07-11 DIAGNOSIS — R932 Abnormal findings on diagnostic imaging of liver and biliary tract: Secondary | ICD-10-CM | POA: Insufficient documentation

## 2015-07-11 DIAGNOSIS — C3490 Malignant neoplasm of unspecified part of unspecified bronchus or lung: Secondary | ICD-10-CM

## 2015-07-11 DIAGNOSIS — R05 Cough: Secondary | ICD-10-CM | POA: Diagnosis not present

## 2015-07-11 LAB — COMPREHENSIVE METABOLIC PANEL
ALT: 19 U/L (ref 0–55)
ANION GAP: 8 meq/L (ref 3–11)
AST: 22 U/L (ref 5–34)
Albumin: 3.5 g/dL (ref 3.5–5.0)
Alkaline Phosphatase: 185 U/L — ABNORMAL HIGH (ref 40–150)
BUN: 7.5 mg/dL (ref 7.0–26.0)
CHLORIDE: 101 meq/L (ref 98–109)
CO2: 27 meq/L (ref 22–29)
Calcium: 9.2 mg/dL (ref 8.4–10.4)
Creatinine: 0.9 mg/dL (ref 0.7–1.3)
EGFR: 78 mL/min/{1.73_m2} — AB (ref >90)
Glucose: 156 mg/dl — ABNORMAL HIGH (ref 70–140)
Potassium: 3.9 mEq/L (ref 3.5–5.1)
Sodium: 137 mEq/L (ref 136–145)
Total Bilirubin: 0.54 mg/dL (ref 0.20–1.20)
Total Protein: 7.5 g/dL (ref 6.4–8.3)

## 2015-07-11 LAB — CBC WITH DIFFERENTIAL/PLATELET
BASO%: 1.1 % (ref 0.0–2.0)
BASOS ABS: 0.1 10*3/uL (ref 0.0–0.1)
EOS ABS: 0.1 10*3/uL (ref 0.0–0.5)
EOS%: 1.8 % (ref 0.0–7.0)
HCT: 41.6 % (ref 38.4–49.9)
HGB: 14.1 g/dL (ref 13.0–17.1)
LYMPH%: 28.1 % (ref 14.0–49.0)
MCH: 31.3 pg (ref 27.2–33.4)
MCHC: 33.8 g/dL (ref 32.0–36.0)
MCV: 92.6 fL (ref 79.3–98.0)
MONO#: 0.9 10*3/uL (ref 0.1–0.9)
MONO%: 10.6 % (ref 0.0–14.0)
NEUT#: 4.8 10*3/uL (ref 1.5–6.5)
NEUT%: 58.4 % (ref 39.0–75.0)
PLATELETS: 230 10*3/uL (ref 140–400)
RBC: 4.5 10*6/uL (ref 4.20–5.82)
RDW: 15.9 % — ABNORMAL HIGH (ref 11.0–14.6)
WBC: 8.1 10*3/uL (ref 4.0–10.3)
lymph#: 2.3 10*3/uL (ref 0.9–3.3)

## 2015-07-11 MED ORDER — IOPAMIDOL (ISOVUE-300) INJECTION 61%
100.0000 mL | Freq: Once | INTRAVENOUS | Status: AC | PRN
  Administered 2015-07-11: 100 mL via INTRAVENOUS

## 2015-07-11 NOTE — Telephone Encounter (Signed)
GAVE ADN PRINTED PAPT SHCED AND AVS FO RPT FOR [place]

## 2015-07-11 NOTE — Patient Instructions (Signed)
Smoking Cessation, Tips for Success If you are ready to quit smoking, congratulations! You have chosen to help yourself be healthier. Cigarettes bring nicotine, tar, carbon monoxide, and other irritants into your body. Your lungs, heart, and blood vessels will be able to work better without these poisons. There are many different ways to quit smoking. Nicotine gum, nicotine patches, a nicotine inhaler, or nicotine nasal spray can help with physical craving. Hypnosis, support groups, and medicines help break the habit of smoking. WHAT THINGS CAN I DO TO MAKE QUITTING EASIER?  Here are some tips to help you quit for good:  Pick a date when you will quit smoking completely. Tell all of your friends and family about your plan to quit on that date.  Do not try to slowly cut down on the number of cigarettes you are smoking. Pick a quit date and quit smoking completely starting on that day.  Throw away all cigarettes.   Clean and remove all ashtrays from your home, work, and car.  On a card, write down your reasons for quitting. Carry the card with you and read it when you get the urge to smoke.  Cleanse your body of nicotine. Drink enough water and fluids to keep your urine clear or pale yellow. Do this after quitting to flush the nicotine from your body.  Learn to predict your moods. Do not let a bad situation be your excuse to have a cigarette. Some situations in your life might tempt you into wanting a cigarette.  Never have "just one" cigarette. It leads to wanting another and another. Remind yourself of your decision to quit.  Change habits associated with smoking. If you smoked while driving or when feeling stressed, try other activities to replace smoking. Stand up when drinking your coffee. Brush your teeth after eating. Sit in a different chair when you read the paper. Avoid alcohol while trying to quit, and try to drink fewer caffeinated beverages. Alcohol and caffeine may urge you to  smoke.  Avoid foods and drinks that can trigger a desire to smoke, such as sugary or spicy foods and alcohol.  Ask people who smoke not to smoke around you.  Have something planned to do right after eating or having a cup of coffee. For example, plan to take a walk or exercise.  Try a relaxation exercise to calm you down and decrease your stress. Remember, you may be tense and nervous for the first 2 weeks after you quit, but this will pass.  Find new activities to keep your hands busy. Play with a pen, coin, or rubber band. Doodle or draw things on paper.  Brush your teeth right after eating. This will help cut down on the craving for the taste of tobacco after meals. You can also try mouthwash.   Use oral substitutes in place of cigarettes. Try using lemon drops, carrots, cinnamon sticks, or chewing gum. Keep them handy so they are available when you have the urge to smoke.  When you have the urge to smoke, try deep breathing.  Designate your home as a nonsmoking area.  If you are a heavy smoker, ask your health care provider about a prescription for nicotine chewing gum. It can ease your withdrawal from nicotine.  Reward yourself. Set aside the cigarette money you save and buy yourself something nice.  Look for support from others. Join a support group or smoking cessation program. Ask someone at home or at work to help you with your plan   to quit smoking.  Always ask yourself, "Do I need this cigarette or is this just a reflex?" Tell yourself, "Today, I choose not to smoke," or "I do not want to smoke." You are reminding yourself of your decision to quit.  Do not replace cigarette smoking with electronic cigarettes (commonly called e-cigarettes). The safety of e-cigarettes is unknown, and some may contain harmful chemicals.  If you relapse, do not give up! Plan ahead and think about what you will do the next time you get the urge to smoke. HOW WILL I FEEL WHEN I QUIT SMOKING? You  may have symptoms of withdrawal because your body is used to nicotine (the addictive substance in cigarettes). You may crave cigarettes, be irritable, feel very hungry, cough often, get headaches, or have difficulty concentrating. The withdrawal symptoms are only temporary. They are strongest when you first quit but will go away within 10-14 days. When withdrawal symptoms occur, stay in control. Think about your reasons for quitting. Remind yourself that these are signs that your body is healing and getting used to being without cigarettes. Remember that withdrawal symptoms are easier to treat than the major diseases that smoking can cause.  Even after the withdrawal is over, expect periodic urges to smoke. However, these cravings are generally short lived and will go away whether you smoke or not. Do not smoke! WHAT RESOURCES ARE AVAILABLE TO HELP ME QUIT SMOKING? Your health care provider can direct you to community resources or hospitals for support, which may include:  Group support.  Education.  Hypnosis.  Therapy.   This information is not intended to replace advice given to you by your health care provider. Make sure you discuss any questions you have with your health care provider.   Document Released: 01/04/2004 Document Revised: 04/28/2014 Document Reviewed: 09/23/2012 Elsevier Interactive Patient Education 2016 Elsevier Inc.  
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
## 2015-07-16 ENCOUNTER — Ambulatory Visit (HOSPITAL_COMMUNITY): Payer: Commercial Managed Care - HMO

## 2015-07-16 ENCOUNTER — Other Ambulatory Visit (HOSPITAL_COMMUNITY): Payer: Self-pay | Admitting: Diagnostic Radiology

## 2015-07-16 DIAGNOSIS — C220 Liver cell carcinoma: Secondary | ICD-10-CM

## 2015-07-25 ENCOUNTER — Ambulatory Visit
Admission: RE | Admit: 2015-07-25 | Discharge: 2015-07-25 | Disposition: A | Discharge status: Home / Self Care | Payer: Commercial Managed Care - HMO | Source: Ambulatory Visit | Attending: Diagnostic Radiology | Admitting: Diagnostic Radiology

## 2015-07-25 DIAGNOSIS — C220 Liver cell carcinoma: Secondary | ICD-10-CM
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
## 2015-10-03 ENCOUNTER — Ambulatory Visit: Payer: Self-pay

## 2015-10-04 ENCOUNTER — Other Ambulatory Visit (HOSPITAL_BASED_OUTPATIENT_CLINIC_OR_DEPARTMENT_OTHER): Payer: Commercial Managed Care - HMO

## 2015-10-04 DIAGNOSIS — C220 Liver cell carcinoma: Secondary | ICD-10-CM | POA: Diagnosis not present

## 2015-10-04 DIAGNOSIS — C3491 Malignant neoplasm of unspecified part of right bronchus or lung: Secondary | ICD-10-CM

## 2015-10-04 LAB — CBC WITH DIFFERENTIAL/PLATELET
BASO%: 0.3 % (ref 0.0–2.0)
BASOS ABS: 0 10*3/uL (ref 0.0–0.1)
EOS ABS: 0.1 10*3/uL (ref 0.0–0.5)
EOS%: 1.3 % (ref 0.0–7.0)
HEMATOCRIT: 34.8 % — AB (ref 38.4–49.9)
HGB: 12.4 g/dL — ABNORMAL LOW (ref 13.0–17.1)
LYMPH#: 2.1 10*3/uL (ref 0.9–3.3)
LYMPH%: 22.9 % (ref 14.0–49.0)
MCH: 32 pg (ref 27.2–33.4)
MCHC: 35.6 g/dL (ref 32.0–36.0)
MCV: 89.9 fL (ref 79.3–98.0)
MONO#: 1.3 10*3/uL — ABNORMAL HIGH (ref 0.1–0.9)
MONO%: 14.4 % — AB (ref 0.0–14.0)
NEUT#: 5.6 10*3/uL (ref 1.5–6.5)
NEUT%: 61.1 % (ref 39.0–75.0)
Platelets: 226 10*3/uL (ref 140–400)
RBC: 3.87 10*6/uL — ABNORMAL LOW (ref 4.20–5.82)
RDW: 14.3 % (ref 11.0–14.6)
WBC: 9.2 10*3/uL (ref 4.0–10.3)

## 2015-10-04 LAB — COMPREHENSIVE METABOLIC PANEL
ALBUMIN: 3.1 g/dL — AB (ref 3.5–5.0)
ALK PHOS: 168 U/L — AB (ref 40–150)
ALT: 13 U/L (ref 0–55)
ANION GAP: 6 meq/L (ref 3–11)
AST: 15 U/L (ref 5–34)
BUN: 9.7 mg/dL (ref 7.0–26.0)
CALCIUM: 8.7 mg/dL (ref 8.4–10.4)
CHLORIDE: 99 meq/L (ref 98–109)
CO2: 26 mEq/L (ref 22–29)
Creatinine: 0.9 mg/dL (ref 0.7–1.3)
EGFR: 81 mL/min/{1.73_m2} — AB (ref >90)
Glucose: 215 mg/dl — ABNORMAL HIGH (ref 70–140)
POTASSIUM: 3.5 meq/L (ref 3.5–5.1)
Sodium: 132 mEq/L — ABNORMAL LOW (ref 136–145)
Total Bilirubin: 0.58 mg/dL (ref 0.20–1.20)
Total Protein: 6.8 g/dL (ref 6.4–8.3)

## 2015-10-09 ENCOUNTER — Ambulatory Visit (HOSPITAL_COMMUNITY)
Admission: RE | Admit: 2015-10-09 | Discharge: 2015-10-09 | Disposition: A | Discharge status: Home / Self Care | Payer: Commercial Managed Care - HMO | Source: Ambulatory Visit | Attending: Internal Medicine | Admitting: Internal Medicine

## 2015-10-09 DIAGNOSIS — K869 Disease of pancreas, unspecified: Secondary | ICD-10-CM | POA: Diagnosis not present

## 2015-10-09 DIAGNOSIS — C3491 Malignant neoplasm of unspecified part of right bronchus or lung: Secondary | ICD-10-CM | POA: Diagnosis not present

## 2015-10-09 DIAGNOSIS — R918 Other nonspecific abnormal finding of lung field: Secondary | ICD-10-CM | POA: Diagnosis not present

## 2015-10-09 DIAGNOSIS — C220 Liver cell carcinoma: Secondary | ICD-10-CM | POA: Diagnosis present

## 2015-10-09 DIAGNOSIS — J90 Pleural effusion, not elsewhere classified: Secondary | ICD-10-CM | POA: Diagnosis not present

## 2015-10-09 MED ORDER — IOPAMIDOL (ISOVUE-300) INJECTION 61%
100.0000 mL | Freq: Once | INTRAVENOUS | Status: AC | PRN
  Administered 2015-10-09: 100 mL via INTRAVENOUS

## 2015-10-10 ENCOUNTER — Encounter: Payer: Self-pay | Admitting: Internal Medicine

## 2015-10-10 ENCOUNTER — Ambulatory Visit (HOSPITAL_BASED_OUTPATIENT_CLINIC_OR_DEPARTMENT_OTHER): Payer: Commercial Managed Care - HMO | Admitting: Internal Medicine

## 2015-10-10 VITALS — BP 127/60 | HR 105 | Temp 98.2°F | Resp 18 | Ht 70.0 in | Wt 139.0 lb

## 2015-10-10 DIAGNOSIS — K8689 Other specified diseases of pancreas: Secondary | ICD-10-CM | POA: Insufficient documentation

## 2015-10-10 DIAGNOSIS — C220 Liver cell carcinoma: Secondary | ICD-10-CM | POA: Diagnosis not present

## 2015-10-10 DIAGNOSIS — Z720 Tobacco use: Secondary | ICD-10-CM

## 2015-10-10 DIAGNOSIS — F17200 Nicotine dependence, unspecified, uncomplicated: Secondary | ICD-10-CM

## 2015-10-10 DIAGNOSIS — R634 Abnormal weight loss: Secondary | ICD-10-CM

## 2015-10-10 DIAGNOSIS — R5383 Other fatigue: Secondary | ICD-10-CM | POA: Diagnosis not present

## 2015-10-10 DIAGNOSIS — C3490 Malignant neoplasm of unspecified part of unspecified bronchus or lung: Secondary | ICD-10-CM | POA: Diagnosis not present

## 2015-10-10 HISTORY — DX: Other specified diseases of pancreas: K86.89
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
## 2015-10-11 ENCOUNTER — Telehealth: Payer: Self-pay | Admitting: Medical Oncology

## 2015-10-11 NOTE — Telephone Encounter (Signed)
I called Austin [name]'s phone and it rang 4 times then no sound.

## 2015-10-14 ENCOUNTER — Encounter: Payer: Self-pay | Admitting: Internal Medicine

## 2015-11-11 ENCOUNTER — Emergency Department (HOSPITAL_COMMUNITY): Payer: Commercial Managed Care - HMO

## 2015-11-11 ENCOUNTER — Observation Stay (HOSPITAL_COMMUNITY)
Admission: EM | Admit: 2015-11-11 | Discharge: 2015-11-13 | Disposition: A | Discharge status: Home / Self Care | Payer: Commercial Managed Care - HMO | Attending: Family Medicine | Admitting: Family Medicine

## 2015-11-11 ENCOUNTER — Observation Stay (HOSPITAL_COMMUNITY): Payer: Commercial Managed Care - HMO

## 2015-11-11 ENCOUNTER — Encounter (HOSPITAL_COMMUNITY): Payer: Self-pay | Admitting: *Deleted

## 2015-11-11 DIAGNOSIS — C3490 Malignant neoplasm of unspecified part of unspecified bronchus or lung: Secondary | ICD-10-CM

## 2015-11-11 DIAGNOSIS — E119 Type 2 diabetes mellitus without complications: Secondary | ICD-10-CM | POA: Insufficient documentation

## 2015-11-11 DIAGNOSIS — C220 Liver cell carcinoma: Secondary | ICD-10-CM | POA: Diagnosis not present

## 2015-11-11 DIAGNOSIS — I679 Cerebrovascular disease, unspecified: Secondary | ICD-10-CM | POA: Diagnosis present

## 2015-11-11 DIAGNOSIS — C799 Secondary malignant neoplasm of unspecified site: Secondary | ICD-10-CM | POA: Diagnosis not present

## 2015-11-11 DIAGNOSIS — F05 Delirium due to known physiological condition: Secondary | ICD-10-CM | POA: Diagnosis not present

## 2015-11-11 DIAGNOSIS — R410 Disorientation, unspecified: Secondary | ICD-10-CM | POA: Diagnosis not present

## 2015-11-11 DIAGNOSIS — R4182 Altered mental status, unspecified: Secondary | ICD-10-CM | POA: Diagnosis present

## 2015-11-11 DIAGNOSIS — R000 Tachycardia, unspecified: Secondary | ICD-10-CM | POA: Insufficient documentation

## 2015-11-11 DIAGNOSIS — D72829 Elevated white blood cell count, unspecified: Secondary | ICD-10-CM | POA: Diagnosis present

## 2015-11-11 DIAGNOSIS — Z85118 Personal history of other malignant neoplasm of bronchus and lung: Secondary | ICD-10-CM | POA: Diagnosis not present

## 2015-11-11 DIAGNOSIS — Z8673 Personal history of transient ischemic attack (TIA), and cerebral infarction without residual deficits: Secondary | ICD-10-CM | POA: Diagnosis not present

## 2015-11-11 DIAGNOSIS — Z515 Encounter for palliative care: Secondary | ICD-10-CM

## 2015-11-11 DIAGNOSIS — D638 Anemia in other chronic diseases classified elsewhere: Secondary | ICD-10-CM | POA: Diagnosis present

## 2015-11-11 DIAGNOSIS — F1011 Alcohol abuse, in remission: Secondary | ICD-10-CM | POA: Diagnosis present

## 2015-11-11 DIAGNOSIS — Z5181 Encounter for therapeutic drug level monitoring: Secondary | ICD-10-CM | POA: Diagnosis not present

## 2015-11-11 DIAGNOSIS — E1165 Type 2 diabetes mellitus with hyperglycemia: Secondary | ICD-10-CM | POA: Diagnosis present

## 2015-11-11 DIAGNOSIS — F17200 Nicotine dependence, unspecified, uncomplicated: Secondary | ICD-10-CM | POA: Diagnosis present

## 2015-11-11 DIAGNOSIS — F17210 Nicotine dependence, cigarettes, uncomplicated: Secondary | ICD-10-CM | POA: Diagnosis not present

## 2015-11-11 DIAGNOSIS — J189 Pneumonia, unspecified organism: Secondary | ICD-10-CM | POA: Diagnosis present

## 2015-11-11 DIAGNOSIS — R079 Chest pain, unspecified: Secondary | ICD-10-CM | POA: Insufficient documentation

## 2015-11-11 DIAGNOSIS — F1010 Alcohol abuse, uncomplicated: Secondary | ICD-10-CM

## 2015-11-11 LAB — RAPID URINE DRUG SCREEN, HOSP PERFORMED
AMPHETAMINES: NOT DETECTED
BARBITURATES: NOT DETECTED
BENZODIAZEPINES: NOT DETECTED
Cocaine: NOT DETECTED
Opiates: NOT DETECTED
Tetrahydrocannabinol: NOT DETECTED

## 2015-11-11 LAB — GLUCOSE, CAPILLARY
GLUCOSE-CAPILLARY: 210 mg/dL — AB (ref 65–99)
Glucose-Capillary: 104 mg/dL — ABNORMAL HIGH (ref 65–99)

## 2015-11-11 LAB — COMPREHENSIVE METABOLIC PANEL
ALK PHOS: 167 U/L — AB (ref 38–126)
ALT: 18 U/L (ref 17–63)
AST: 26 U/L (ref 15–41)
Albumin: 2.8 g/dL — ABNORMAL LOW (ref 3.5–5.0)
Anion gap: 8 (ref 5–15)
BUN: 17 mg/dL (ref 6–20)
CHLORIDE: 97 mmol/L — AB (ref 101–111)
CO2: 28 mmol/L (ref 22–32)
CREATININE: 0.97 mg/dL (ref 0.61–1.24)
Calcium: 8.1 mg/dL — ABNORMAL LOW (ref 8.9–10.3)
GFR calc Af Amer: >60 mL/min (ref >60)
Glucose, Bld: 275 mg/dL — ABNORMAL HIGH (ref 65–99)
Potassium: 3.8 mmol/L (ref 3.5–5.1)
Sodium: 133 mmol/L — ABNORMAL LOW (ref 135–145)
Total Bilirubin: 0.7 mg/dL (ref 0.3–1.2)
Total Protein: 6.6 g/dL (ref 6.5–8.1)

## 2015-11-11 LAB — URINE MICROSCOPIC-ADD ON
Bacteria, UA: NONE SEEN
RBC / HPF: NONE SEEN RBC/hpf (ref 0–5)
SQUAMOUS EPITHELIAL / LPF: NONE SEEN

## 2015-11-11 LAB — AMMONIA: Ammonia: 10 umol/L (ref 9–35)

## 2015-11-11 LAB — CBC WITH DIFFERENTIAL/PLATELET
BASOS ABS: 0 10*3/uL (ref 0.0–0.1)
Basophils Relative: 0 %
EOS PCT: 0 %
Eosinophils Absolute: 0 10*3/uL (ref 0.0–0.7)
HEMATOCRIT: 28.9 % — AB (ref 39.0–52.0)
HEMOGLOBIN: 9.9 g/dL — AB (ref 13.0–17.0)
LYMPHS PCT: 14 %
Lymphs Abs: 1.7 10*3/uL (ref 0.7–4.0)
MCH: 30.2 pg (ref 26.0–34.0)
MCHC: 34.3 g/dL (ref 30.0–36.0)
MCV: 88.1 fL (ref 78.0–100.0)
Monocytes Absolute: 1.2 10*3/uL — ABNORMAL HIGH (ref 0.1–1.0)
Monocytes Relative: 10 %
NEUTROS ABS: 9.4 10*3/uL — AB (ref 1.7–7.7)
NEUTROS PCT: 76 %
PLATELETS: 407 10*3/uL — AB (ref 150–400)
RBC: 3.28 MIL/uL — AB (ref 4.22–5.81)
RDW: 15.2 % (ref 11.5–15.5)
WBC: 12.4 10*3/uL — AB (ref 4.0–10.5)

## 2015-11-11 LAB — URINALYSIS, ROUTINE W REFLEX MICROSCOPIC
BILIRUBIN URINE: NEGATIVE
HGB URINE DIPSTICK: NEGATIVE
Ketones, ur: NEGATIVE mg/dL
Leukocytes, UA: NEGATIVE
Nitrite: NEGATIVE
PROTEIN: 30 mg/dL — AB
Specific Gravity, Urine: 1.026 (ref 1.005–1.030)
pH: 6 (ref 5.0–8.0)

## 2015-11-11 LAB — RETICULOCYTES
RBC.: 3.62 MIL/uL — AB (ref 4.22–5.81)
RETIC COUNT ABSOLUTE: 68.8 10*3/uL (ref 19.0–186.0)
RETIC CT PCT: 1.9 % (ref 0.4–3.1)

## 2015-11-11 LAB — LIPASE, BLOOD: LIPASE: 11 U/L (ref 11–51)

## 2015-11-11 LAB — I-STAT TROPONIN, ED: Troponin i, poc: 0 ng/mL (ref 0.00–0.08)

## 2015-11-11 LAB — ETHANOL

## 2015-11-11 LAB — IRON AND TIBC
Iron: 31 ug/dL — ABNORMAL LOW (ref 45–182)
Saturation Ratios: 15 % — ABNORMAL LOW (ref 17.9–39.5)
TIBC: 203 ug/dL — ABNORMAL LOW (ref 250–450)
UIBC: 172 ug/dL

## 2015-11-11 LAB — FERRITIN: Ferritin: 658 ng/mL — ABNORMAL HIGH (ref 24–336)

## 2015-11-11 LAB — VITAMIN B12: VITAMIN B 12: 549 pg/mL (ref 180–914)

## 2015-11-11 LAB — FOLATE: FOLATE: 15.9 ng/mL (ref >5.9)

## 2015-11-11 LAB — TSH: TSH: 1.875 u[IU]/mL (ref 0.350–4.500)

## 2015-11-11 LAB — LACTIC ACID, PLASMA: Lactic Acid, Venous: 1.1 mmol/L (ref 0.5–1.9)

## 2015-11-11 MED ORDER — INSULIN ASPART 100 UNIT/ML ~~LOC~~ SOLN: 0.0000 [IU] | Freq: Three times a day (TID) | SUBCUTANEOUS | Status: DC

## 2015-11-11 MED ORDER — ACETAMINOPHEN 650 MG RE SUPP: 650.0000 mg | Freq: Four times a day (QID) | RECTAL | Status: DC | PRN

## 2015-11-11 MED ORDER — ADULT MULTIVITAMIN W/MINERALS CH
1.0000 | ORAL_TABLET | Freq: Every day | ORAL | Status: DC
  Administered 2015-11-11 – 2015-11-12 (×2): 1 via ORAL
  Filled 2015-11-11 (×2): qty 1

## 2015-11-11 MED ORDER — ATORVASTATIN CALCIUM 10 MG PO TABS
10.0000 mg | ORAL_TABLET | Freq: Every day | ORAL | Status: DC
  Administered 2015-11-12: 10 mg via ORAL
  Filled 2015-11-11: qty 1

## 2015-11-11 MED ORDER — VITAMIN B-1 100 MG PO TABS
100.0000 mg | ORAL_TABLET | Freq: Every day | ORAL | Status: DC
  Administered 2015-11-11 – 2015-11-12 (×2): 100 mg via ORAL
  Filled 2015-11-11 (×2): qty 1

## 2015-11-11 MED ORDER — DOXYCYCLINE HYCLATE 100 MG PO TABS
100.0000 mg | ORAL_TABLET | Freq: Two times a day (BID) | ORAL | Status: DC
  Administered 2015-11-11 – 2015-11-12 (×2): 100 mg via ORAL
  Filled 2015-11-11 (×2): qty 1

## 2015-11-11 MED ORDER — ASPIRIN 81 MG PO CHEW
81.0000 mg | CHEWABLE_TABLET | Freq: Every morning | ORAL | Status: DC
  Administered 2015-11-12: 81 mg via ORAL
  Filled 2015-11-11: qty 1

## 2015-11-11 MED ORDER — LEVETIRACETAM 500 MG PO TABS
500.0000 mg | ORAL_TABLET | Freq: Two times a day (BID) | ORAL | Status: DC
  Administered 2015-11-11 – 2015-11-12 (×2): 500 mg via ORAL
  Filled 2015-11-11 (×2): qty 1

## 2015-11-11 MED ORDER — ACETAMINOPHEN 325 MG PO TABS: 650.0000 mg | ORAL_TABLET | Freq: Four times a day (QID) | ORAL | Status: DC | PRN

## 2015-11-11 MED ORDER — QUETIAPINE FUMARATE 50 MG PO TABS: 25.0000 mg | ORAL_TABLET | Freq: Every evening | ORAL | Status: DC | PRN

## 2015-11-11 MED ORDER — SODIUM CHLORIDE 0.9 % IV SOLN: INTRAVENOUS | Status: AC

## 2015-11-11 MED ORDER — DOCUSATE SODIUM 100 MG PO CAPS
100.0000 mg | ORAL_CAPSULE | Freq: Two times a day (BID) | ORAL | Status: DC
  Administered 2015-11-11 – 2015-11-12 (×2): 100 mg via ORAL
  Filled 2015-11-11 (×2): qty 1

## 2015-11-11 MED ORDER — FOLIC ACID 1 MG PO TABS
1.0000 mg | ORAL_TABLET | Freq: Every day | ORAL | Status: DC
  Administered 2015-11-11 – 2015-11-12 (×2): 1 mg via ORAL
  Filled 2015-11-11 (×2): qty 1

## 2015-11-11 MED ORDER — ENOXAPARIN SODIUM 40 MG/0.4ML ~~LOC~~ SOLN
40.0000 mg | SUBCUTANEOUS | Status: DC
  Administered 2015-11-11: 40 mg via SUBCUTANEOUS
  Filled 2015-11-11: qty 0.4

## 2015-11-11 MED ORDER — NICOTINE 21 MG/24HR TD PT24
21.0000 mg | MEDICATED_PATCH | Freq: Every day | TRANSDERMAL | Status: DC
  Administered 2015-11-11 – 2015-11-12 (×2): 21 mg via TRANSDERMAL
  Filled 2015-11-11 (×2): qty 1

## 2015-11-11 MED ORDER — TAB-A-VITE/IRON PO TABS: 1.0000 | ORAL_TABLET | Freq: Every day | ORAL | Status: DC

## 2015-11-11 MED ORDER — IOPAMIDOL (ISOVUE-370) INJECTION 76%
100.0000 mL | Freq: Once | INTRAVENOUS | Status: AC | PRN
  Administered 2015-11-11: 100 mL via INTRAVENOUS

## 2015-11-11 NOTE — ED Notes (Signed)
Report called to Cindy on [facility]

## 2015-11-11 NOTE — ED Notes (Signed)
Patient's shirt, undershirt, jeans, tennis shoes, socks and wallet sent to 3W with patient.
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
## 2015-11-11 NOTE — ED Notes (Signed)
Bed: RESB Expected date:  Expected time:  Means of arrival:  Comments: EMS hold - confused

## 2015-11-11 NOTE — ED Triage Notes (Signed)
Per EMS - patient comes from home where he lives alone.  According to pt's neighbors, he was discharged from [place] yesterday.  Pt reports it was to receive cancer tx.  Pt's neighbors found him in his driveway at 3 am today and shortly before his arrival to ED, went to check on him and found him confused.  Patient states he is here for treatment.  Patient is oriented to person and place, but not time or events.  Patient' vitals on scene, 110/62, HR 126, 96% on RA, CBG 209, RR 16.    According to chart, some question as to whether patient is pursuing chemotherapy and notes regarding quality of life given current prognosis.
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
## 2015-11-12 DIAGNOSIS — R410 Disorientation, unspecified: Secondary | ICD-10-CM | POA: Diagnosis not present

## 2015-11-12 DIAGNOSIS — Z515 Encounter for palliative care: Secondary | ICD-10-CM

## 2015-11-12 DIAGNOSIS — D638 Anemia in other chronic diseases classified elsewhere: Secondary | ICD-10-CM | POA: Diagnosis not present

## 2015-11-12 DIAGNOSIS — F05 Delirium due to known physiological condition: Secondary | ICD-10-CM | POA: Diagnosis not present

## 2015-11-12 DIAGNOSIS — C220 Liver cell carcinoma: Secondary | ICD-10-CM | POA: Diagnosis not present

## 2015-11-12 DIAGNOSIS — Z789 Other specified health status: Secondary | ICD-10-CM

## 2015-11-12 DIAGNOSIS — I679 Cerebrovascular disease, unspecified: Secondary | ICD-10-CM | POA: Diagnosis not present

## 2015-11-12 LAB — CBC
HEMATOCRIT: 30.7 % — AB (ref 39.0–52.0)
Hemoglobin: 10.6 g/dL — ABNORMAL LOW (ref 13.0–17.0)
MCH: 30.5 pg (ref 26.0–34.0)
MCHC: 34.5 g/dL (ref 30.0–36.0)
MCV: 88.2 fL (ref 78.0–100.0)
PLATELETS: 393 10*3/uL (ref 150–400)
RBC: 3.48 MIL/uL — AB (ref 4.22–5.81)
RDW: 15.5 % (ref 11.5–15.5)
WBC: 11.3 10*3/uL — ABNORMAL HIGH (ref 4.0–10.5)

## 2015-11-12 LAB — BASIC METABOLIC PANEL
Anion gap: 5 (ref 5–15)
BUN: 15 mg/dL (ref 6–20)
CHLORIDE: 100 mmol/L — AB (ref 101–111)
CO2: 29 mmol/L (ref 22–32)
CREATININE: 0.76 mg/dL (ref 0.61–1.24)
Calcium: 8.2 mg/dL — ABNORMAL LOW (ref 8.9–10.3)
GFR calc Af Amer: >60 mL/min (ref >60)
GFR calc non Af Amer: >60 mL/min (ref >60)
Glucose, Bld: 107 mg/dL — ABNORMAL HIGH (ref 65–99)
POTASSIUM: 3.7 mmol/L (ref 3.5–5.1)
Sodium: 134 mmol/L — ABNORMAL LOW (ref 135–145)

## 2015-11-12 LAB — GLUCOSE, CAPILLARY
GLUCOSE-CAPILLARY: 106 mg/dL — AB (ref 65–99)
Glucose-Capillary: 227 mg/dL — ABNORMAL HIGH (ref 65–99)

## 2015-11-12 LAB — VITAMIN D 25 HYDROXY (VIT D DEFICIENCY, FRACTURES): Vit D, 25-Hydroxy: 12.4 ng/mL — ABNORMAL LOW (ref 30.0–100.0)

## 2015-11-12 MED ORDER — SODIUM CHLORIDE 0.9 % IV SOLN: 250.0000 mL | INTRAVENOUS | Status: DC | PRN

## 2015-11-12 MED ORDER — ENSURE ENLIVE PO LIQD
237.0000 mL | Freq: Two times a day (BID) | ORAL | Status: DC
Start: 2015-11-12 — End: 2015-11-12

## 2015-11-12 MED ORDER — LORAZEPAM 2 MG/ML IJ SOLN
INTRAMUSCULAR | Status: AC
  Administered 2015-11-12: 1 mg via INTRAVENOUS
  Filled 2015-11-12: qty 1

## 2015-11-12 MED ORDER — LORAZEPAM 2 MG/ML PO CONC: 1.0000 mg | ORAL | Status: DC

## 2015-11-12 MED ORDER — LORAZEPAM 2 MG/ML IJ SOLN
1.0000 mg | INTRAMUSCULAR | Status: DC | PRN
  Administered 2015-11-12: 1 mg via INTRAVENOUS

## 2015-11-12 MED ORDER — LORAZEPAM 2 MG/ML IJ SOLN: 1.0000 mg | INTRAMUSCULAR | Status: DC | PRN

## 2015-11-12 MED ORDER — LORAZEPAM 2 MG/ML IJ SOLN
1.0000 mg | INTRAMUSCULAR | Status: DC
  Administered 2015-11-12 – 2015-11-13 (×3): 1 mg via INTRAVENOUS
  Filled 2015-11-12 (×3): qty 1

## 2015-11-12 MED ORDER — FENTANYL 12 MCG/HR TD PT72
12.5000 ug | MEDICATED_PATCH | TRANSDERMAL | Status: DC
  Administered 2015-11-12: 12.5 ug via TRANSDERMAL
  Filled 2015-11-12: qty 1

## 2015-11-12 MED ORDER — SODIUM CHLORIDE 0.9% FLUSH: 3.0000 mL | INTRAVENOUS | Status: DC | PRN

## 2015-11-12 MED ORDER — LORAZEPAM 1 MG PO TABS
1.0000 mg | ORAL_TABLET | ORAL | Status: DC
  Filled 2015-11-12: qty 1

## 2015-11-12 MED ORDER — FENTANYL CITRATE (PF) 100 MCG/2ML IJ SOLN: 25.0000 ug | INTRAMUSCULAR | Status: DC | PRN

## 2015-11-12 MED ORDER — SODIUM CHLORIDE 0.9% FLUSH
3.0000 mL | Freq: Two times a day (BID) | INTRAVENOUS | Status: DC
  Administered 2015-11-12 – 2015-11-13 (×2): 3 mL via INTRAVENOUS

## 2015-11-12 NOTE — Progress Notes (Signed)
Patient became extremely agitated, demanding to leave and pushing staff to exit the room. Three staff members present at bedside and unable to calm and redirect pt. [employer] and security called to department to assist with calming pt.  MD paged and orders for ativan obtained and administered. Will continue to monitor.

## 2015-11-12 NOTE — Consult Note (Signed)
Consultation Note Date: 11/12/2015   Patient Name: Austin [name]  DOB: 14-Jun-1933  MRN: [id number]  Age / Sex: 82 y.o., male  PCP: Austin Chapman, NP Referring Physician: Murlean Iba, MD  Reason for Consultation: Establishing goals of care  HPI/Patient Profile: 82 y.o. male  with past medical history of hepatocellular cancer, non small cell lung cancer, Type 2 DM, alcohol and illicit drug abuse, admitted on 11/11/2015 with acute delirium and confusion. He was recently discharged from [facility] after a stay also related to confusion. His sister Austin [name] reports that she feels he should not have been discharged home. Recommendations were made to discharge him to SNF, however, he was discharged home instead. He has had significant decline over the past several months with increasing confusion and agitation. His last appointment with his oncologist, Dr. Julien [name], was at the end of June. Per Dr. Worthy Flank note, CT scan showed progression of hepatic and lung cancers. Patient was offered treatment vs hospice. Dr. Worthy Flank note indicated that cancer was aggressive and that patient needed to make a decision quickly. Austin [name] states patient did not want to pursue further chemotherapy. He has been aggressively agitated at home, not eating, and functional level has been declining rapidly. Most recently he was found at 3am sleeping in his driveway, feeding his cat Austin [name], and other behaviors.  Clinical Assessment and Goals of Care: Austin [name] has an aggressive cancer and has elected not to have treatment. Last CT scan showed progression of lung, liver, and pancreatic mass. This appears to be a terminal delirium and Austin [name] would like patient to be transitioned to comfort care only and transferred to a residential hospice.   HCPOA- sister- Austin [name]    SUMMARY OF RECOMMENDATIONS   Patient is transitioning to end of life.  Severely cachexic, terminal delirium, functional status has been impaired. Recommend comfort care, palliative sedation, DNR.   Code Status/Advance Care Planning:  DNR    Symptom Management:   Delirium/Agitation- Ativan 42m q2 hours; Ativan 1 mg q30 min if scheduled ativan is inadequate  Pain control- Fentanyl patch 12.580m; prn fentanyl  Palliative Prophylaxis:   Delirium Protocol, Frequent Pain Assessment and Turn Reposition  Additional Recommendations (Limitations, Scope, Preferences):  Avoid Hospitalization, Full Comfort Care, No Artificial Feeding, No Blood Transfusions, No Chemotherapy, No Diagnostics, No Glucose Monitoring, No Hemodialysis, No IV Antibiotics, No IV Fluids, No Lab Draws, No Radiation and No Surgical Procedures  Psycho-social/Spiritual:   Desire for further Chaplaincy support:No  Additional Recommendations:   Prognosis:   < 2 weeks d/t aggressive cancer that is not being treated, poor functional perfomance, likely terminal delirium, cachexia  Discharge Planning: Hospice facility pending evaluation by Hospice. Family indicated they would [facility]     Primary Diagnoses: Present on Admission: . History of alcohol abuse . TOBACCO DEPENDENCE . Cerebrovascular disease . Squamous cell lung cancer ([facility]. Hepatocellular carcinoma ([facility]. Type 2 diabetes mellitus with hyperglycemia ([facility]. [facility] ([facility]. Leukocytosis . Anemia, chronic  disease . Acute confusional state . Acute confusion due to known medical condition . Postobstructive pneumonia   I have reviewed the medical record, interviewed the patient and family, and examined the patient. The following aspects are pertinent.  Past Medical History:  Diagnosis Date  . Alcoholism ([place])    hx of quit in 2000 after inpt therapy   . Blood transfusion   . Cancer ([place]) 06/2011   lung; hepatocellular cancer 08/2014  . Cancer of lung ([place])     . Cataract    right eye, lens implant left with s/p cataract surgery 9'16  . Diabetes type 2, controlled ([place]) 09/07/2014  . GERD (gastroesophageal reflux disease)    pt states not using medication   . Hepatic cirrhosis ([place])   . Kidney stones    hx of   . Pancreatic mass 10/10/2015  . Shaking    hands bilat more per right   . Shortness of breath    WITH COUGH pt states has had since receiving chemo txs  . Status post chemotherapy    last tx approx 3 years ago   . Stroke [facility])    1980's with left sided weakness - has currently improved  . Trauma    right fifth finger / amputation    Social History   Social History  . Marital status: Single    Spouse name: N/A  . Number of children: N/A  . Years of education: N/A   Occupational History  . Retired Music therapist   Social History Main Topics  . Smoking status: Current Every Day Smoker    Packs/day: 0.50    Years: 55.00    Types: Cigarettes  . Smokeless tobacco: Never Used     Comment: 10 cigs a day  . Alcohol use No     Comment: quit 2000   . Drug use: No  . Sexual activity: Not Currently   Other Topics Concern  . None   Social History Narrative  . None   Family History  Problem Relation Age of Onset  . Cardiomyopathy Mother     Rheumatic heart Disease  . Arthritis Sister   . Diabetes Brother   . Alcohol abuse Sister   . Anesthesia problems Neg Hx   . Hypotension Neg Hx   . Malignant hyperthermia Neg Hx   . Pseudochol deficiency Neg Hx    Scheduled Meds: . fentaNYL  12.5 mcg Transdermal Q72H  . LORazepam  1 mg Oral Q4H   Or  . LORazepam  1 mg Sublingual Q4H   Or  . LORazepam  1 mg Intravenous Q4H  . multivitamin with minerals  1 tablet Oral Daily  . sodium chloride flush  3 mL Intravenous Q12H   Continuous Infusions:  PRN Meds:.sodium chloride, acetaminophen **OR** acetaminophen, fentaNYL (SUBLIMAZE) injection, LORazepam, sodium chloride flush Medications Prior to Admission:  Prior  to Admission medications   Medication Sig Start Date End Date Taking? Authorizing Provider  aspirin 81 MG tablet Take 81 mg by mouth every morning.    Yes Historical Provider, MD  atorvastatin (LIPITOR) 40 MG tablet Take 10 mg by mouth daily. 11/10/15  Yes Historical Provider, MD  levETIRAcetam (KEPPRA) 500 MG tablet Take 500 mg by mouth 2 (two) times daily. 11/10/15  Yes Historical Provider, MD  albuterol (PROAIR HFA) 108 (90 BASE) MCG/ACT inhaler Inhale 1-2 puffs into the lungs every 4 (four) hours as needed for wheezing or shortness of breath. Patient not taking: Reported on  10/10/2015 07/14/13   Amber Fidel Levy, MD  feeding supplement (ENSURE COMPLETE) LIQD Take 237 mLs by mouth 2 (two) times daily between meals. Patient not taking: Reported on 11/11/2015 01/29/12   Domenic Polite, MD  glucose monitoring kit (FREESTYLE) monitoring kit 1 each by Does not apply route as needed for other. Check your blood sugars twice a day. Dispense 60 strips/month, lancets. Patient not taking: Reported on 07/25/2015 10/21/14   Venetia Maxon Rama, MD   Allergies  Allergen Reactions  . Penicillins Swelling    Has patient had a PCN reaction causing immediate rash, facial/tongue/throat swelling, SOB or lightheadedness with hypotension: yes Has patient had a PCN reaction causing severe rash involving mucus membranes or skin necrosis: No Has patient had a PCN reaction that required hospitalization No Has patient had a PCN reaction occurring within the last 10 years: No If all of the above answers are "NO", then may proceed with Cephalosporin use.   Review of Systems  Unable to perform ROS: Mental status change    Physical Exam  Constitutional: He appears cachectic. He appears toxic. He has a sickly appearance. He is sedated.     Eyes: Scleral icterus is present.  Cardiovascular: Normal rate and regular rhythm.   Pulmonary/Chest: Effort normal and breath sounds normal.  Abdominal: Bowel sounds are normal.   Neurological:  Sedated, agitated when awake, disoriented  Skin:  jaundice  Psychiatric: Cognition and memory are impaired. He expresses inappropriate judgment.  Agitated when awake    Vital Signs: BP 112/64 (BP Location: Right Arm)   Pulse 88   Temp 97.5 F (36.4 C) (Oral)   Resp 14   Ht _0  (1.778 m)   Wt 57.4 kg (126 lb 8.7 oz)   SpO2 92%   BMI 18.16 kg/m  Pain Assessment: No/denies pain       SpO2: SpO2: 92 % O2 Device:SpO2: 92 % O2 Flow Rate: .   IO: Intake/output summary:   Intake/Output Summary (Last 24 hours) at 11/12/15 1426 Last data filed at 11/12/15 1229  Gross per 24 hour  Intake             1060 ml  Output              401 ml  Net              659 ml    LBM: Last BM Date: 11/11/15 Baseline Weight: Weight: 57.4 kg (126 lb 8.7 oz) Most recent weight: Weight: 57.4 kg (126 lb 8.7 oz)     Palliative Assessment/Data:     Time In: 1300 Time Out: 1345 Time Total: 45 minutes  Thank you for this consult. Palliative will continue to follow and assist as needed.  Greater than 50%  of this time was spent counseling and coordinating care related to the above assessment and plan.  Signed by: Mariana Kaufman, AGNP-C  Please contact Palliative Medicine Team phone at [phone] for questions and concerns.  For individual provider: See Shea Evans
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
## 2015-11-12 NOTE — Evaluation (Signed)
Physical Therapy Evaluation Patient Details Name: Austin [name] MRN: [id number] DOB: 03/23/1934 Today's Date: 11/12/2015   History of Present Illness  82 yo male admitted with ETOH abuse. Hx of met NSCLC, alcoholism, DM, CVA  Clinical Impression  On eval, pt required Min assist for mobility. He walked ~200 feet with hallway handrail vs 1 HHA. LOB multiple times during ambulation and during balance testing. No family present during session. Do not feel pt is safe to return to home alone. At this time, recommend ST rehab at SNF due to weakness, impaired gait/balance, decreased safety awareness, and impaired cognition. Will follow and progress activity as tolerated. If pt returns home, highly recommend 24 hour supervision/assist.     Follow Up Recommendations SNF    Equipment Recommendations   (to be determined-pt states he already has a RW but no family present to confirm this)    Recommendations for Other Services OT consult     Precautions / Restrictions Precautions Precautions: Fall Restrictions Weight Bearing Restrictions: No      Mobility  Bed Mobility Overal bed mobility: Needs Assistance Bed Mobility: Supine to Sit     Supine to sit: Min guard     General bed mobility comments: close guard for safety  Transfers Overall transfer level: Needs assistance Equipment used: 1 person hand held assist Transfers: Sit to/from Stand Sit to Stand: Min assist         General transfer comment: Assist to rise, stabilize, control descent.   Ambulation/Gait Ambulation/Gait assistance: Min assist Ambulation Distance (Feet): 200 Feet Assistive device: 1 person hand held assist (vs hallway handrail)       General Gait Details: unsteady with LOB multiple times during ambulation. Cues for safety.   Stairs            Wheelchair Mobility    Modified Rankin (Stroke Patients Only)       Balance Overall balance assessment: Needs assistance           Standing  balance-Leahy Scale: Fair Standing balance comment: had pt perfrom static standing with EO, external perturbations-LOB repeatedly. Narrow BOS static stance-Min guard assist.                              Pertinent Vitals/Pain Pain Assessment: No/denies pain    Home Living Family/patient expects to be discharged to:: Unsure Living Arrangements: Alone   Type of Home: House       Home Layout: One level        Prior Function Level of Independence: Independent               Hand Dominance        Extremity/Trunk Assessment   Upper Extremity Assessment: Generalized weakness           Lower Extremity Assessment: Generalized weakness      Cervical / Trunk Assessment: Kyphotic  Communication   Communication: No difficulties  Cognition Arousal/Alertness: Awake/alert Behavior During Therapy: WFL for tasks assessed/performed Overall Cognitive Status: No family/caregiver present to determine baseline cognitive functioning Area of Impairment: Orientation;Safety/judgement;Problem solving Orientation Level: Disoriented to;Place;Time;Situation   Memory: Decreased recall of precautions   Safety/Judgement: Decreased awareness of safety;Decreased awareness of deficits   Problem Solving: Requires tactile cues;Requires verbal cues      General Comments      Exercises        Assessment/Plan    PT Assessment Patient needs continued PT services  PT Diagnosis Difficulty  walking;Generalized weakness;Altered mental status   PT Problem List Decreased strength;Decreased activity tolerance;Decreased balance;Decreased cognition;Decreased mobility;Decreased knowledge of use of DME;Decreased safety awareness  PT Treatment Interventions Gait training;Functional mobility training;Balance training;Therapeutic exercise;Therapeutic activities;Patient/family education   PT Goals (Current goals can be found in the Care Plan section) Acute Rehab PT Goals Patient Stated  Goal: home to take care of his affairs PT Goal Formulation: With patient Time For Goal Achievement: 11/26/15 Potential to Achieve Goals: Good    Frequency Min 3X/week   Barriers to discharge        Co-evaluation               End of Session Equipment Utilized During Treatment: Gait belt Activity Tolerance: Patient tolerated treatment well Patient left:  (in bathroom with sitter)      Functional Assessment Tool Used: clinical judgement Functional Limitation: Mobility: Walking and moving around Mobility: Walking and Moving Around Current Status (V0746): At least 20 percent but less than 40 percent impaired, limited or restricted Mobility: Walking and Moving Around Goal Status [phone]): At least 1 percent but less than 20 percent impaired, limited or restricted    Time: 4730-8569 PT Time Calculation (min) (ACUTE ONLY): 12 min   Charges:   PT Evaluation $PT Eval Low Complexity: 1 Procedure     PT G Codes:   PT G-Codes **NOT FOR INPATIENT CLASS** Functional Assessment Tool Used: clinical judgement Functional Limitation: Mobility: Walking and moving around Mobility: Walking and Moving Around Current Status (A3700): At least 20 percent but less than 40 percent impaired, limited or restricted Mobility: Walking and Moving Around Goal Status [phone]): At least 1 percent but less than 20 percent impaired, limited or restricted    Weston Anna, MPT Pager: [phone]

## 2015-11-12 NOTE — Progress Notes (Signed)
Inpatient Diabetes Program Recommendations  AACE/ADA: New Consensus Statement on Inpatient Glycemic Control (2015)  Target Ranges:  Prepandial:   less than 140 mg/dL      Peak postprandial:   less than 180 mg/dL (1-2 hours)      Critically ill patients:  140 - 180 mg/dL   Lab Results  Component Value Date   GLUCAP 106 (H) 11/12/2015   HGBA1C 7.6 (H) 06/18/2015    Review of Glycemic Control  Diabetes history: DM2 Outpatient Diabetes medications: None Current orders for Inpatient glycemic control: Novolog sensitive tidwc  Inpatient Diabetes Program Recommendations:    Check HgbA1C to assess glycemic control prior to hospitalization. Last one 7.6% on 06/18/2015.  Will continue to follow. Thank you. Lorenda Peck, RD, LDN, CDE Inpatient Diabetes Coordinator [phone]

## 2015-11-12 NOTE — Progress Notes (Signed)
Initial Nutrition Assessment  DOCUMENTATION CODES:   Severe malnutrition in context of chronic illness  INTERVENTION:  -Ensure Enlive po BID, each supplement provides 350 kcal and 20 grams of protein -RD to continue to monitor  NUTRITION DIAGNOSIS:   Malnutrition related to chronic illness as evidenced by severe depletion of muscle mass, severe depletion of body fat.  GOAL:   Patient will meet greater than or equal to 90% of their needs  MONITOR:   PO intake, I & O's, Labs, Weight trends, Supplement acceptance  REASON FOR ASSESSMENT:   Consult Assessment of nutrition requirement/status  ASSESSMENT:   Austin [name] is a 82 y.o. male with a history of metastatic non-small cell lung cancer diagnosed and treated with chemo in 2013 who presents to the emergency department with a recurrent episode of acute confusion and delirium.  From report by the health care power of attorney Mrs. Hassan Rowan weeks [phone]) patient has had increasing confusion for past several weeks and was recently discharged from [facility] after a significant workup and no clear etiology for his confusion found  Spoke with Mr. Bradburn at bedside. He endorses good appetite at home, eating 3 meals a day previously but continues to lose weight. Pt also has a hx of alcohol abuse. Per chart pt exhibits a 49#/28% severe wt loss in 10 months Likely related to hepatocellular cancer & pancreatic mass. Pt ate well this morning per Nurse Tech, consumed 100% of breakfast.  Nutrition-Focused physical exam completed. Findings are severe fat depletion, severe muscle depletion, and no edema.   Pt would like ensure during stay, CBGs were high upon admit but are now under control @ 106  Labs and medications reviewed: Na 134, Banana Bag, Colace  Diet Order:  Diet Carb Modified Fluid consistency: Thin; Room service appropriate? Yes  Skin:  Reviewed, no issues  Last BM:  11/11/2015  Height:   Ht Readings  from Last 1 Encounters:  11/12/15 '5\' 10"'$  (1.778 m)    Weight:   Wt Readings from Last 1 Encounters:  11/11/15 126 lb 8.7 oz (57.4 kg)    Ideal Body Weight:  75.45 kg  BMI:  Body mass index is 18.16 kg/m.  Estimated Nutritional Needs:   Kcal:  2000-2300 calories  Protein:  75-100 grams  Fluid:  >/= 2L  EDUCATION NEEDS:   Education needs addressed  Satira Anis. Kavir Savoca, MS, RD LDN Inpatient Clinical Dietitian Pager [phone]

## 2015-11-13 DIAGNOSIS — F05 Delirium due to known physiological condition: Secondary | ICD-10-CM | POA: Diagnosis not present

## 2015-11-13 DIAGNOSIS — I679 Cerebrovascular disease, unspecified: Secondary | ICD-10-CM | POA: Diagnosis not present

## 2015-11-13 DIAGNOSIS — J189 Pneumonia, unspecified organism: Secondary | ICD-10-CM

## 2015-11-13 DIAGNOSIS — R410 Disorientation, unspecified: Secondary | ICD-10-CM | POA: Diagnosis not present

## 2015-11-13 DIAGNOSIS — C3490 Malignant neoplasm of unspecified part of unspecified bronchus or lung: Secondary | ICD-10-CM

## 2015-11-13 DIAGNOSIS — D638 Anemia in other chronic diseases classified elsewhere: Secondary | ICD-10-CM | POA: Diagnosis not present

## 2015-11-13 DIAGNOSIS — C220 Liver cell carcinoma: Secondary | ICD-10-CM | POA: Diagnosis not present

## 2015-11-13 LAB — HEMOGLOBIN A1C
Hgb A1c MFr Bld: 7.6 % — ABNORMAL HIGH (ref 4.8–5.6)
MEAN PLASMA GLUCOSE: 171 mg/dL

## 2015-11-13 MED ORDER — LORAZEPAM 2 MG/ML PO CONC: 1.0000 mg | ORAL | 0 refills | Status: AC

## 2015-11-13 MED ORDER — FENTANYL 12 MCG/HR TD PT72: 12.5000 ug | MEDICATED_PATCH | TRANSDERMAL | 0 refills | Status: AC

## 2015-11-13 NOTE — Care Management Obs Status (Signed)
[name] NOTIFICATION   Patient Details  Name: Austin [name] MRN: [id number] Date of Birth: 1933/08/28   Medicare Observation Status Notification Given:  Yes    Leeroy Cha, RN 11/13/2015, 11:35 AM
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
## 2015-11-13 NOTE — Progress Notes (Signed)
CSW has contacted [facility] to facilitate DC today. [facility] is able to offer bed today 7/25. Patients family is meeting at the facility around 11:30 and would like patient to be transported then. Please consult CSW for any discharge needs.   Kingsley Spittle, [place] Clinical Social Worker [phone]

## 2015-11-13 NOTE — Progress Notes (Signed)
Patient is set to discharge to [facility] today. Patient & family, Hassan Rowan attempted to contact, aware. Discharge packet given to RN, Legrand Rams. [facility] called for transport.   Kingsley Spittle, [place] Clinical Social Worker [phone]

## 2015-11-20 DEATH — deceased

## 2015-11-22 DIAGNOSIS — Z515 Encounter for palliative care: Secondary | ICD-10-CM

## 2015-12-20 ENCOUNTER — Ambulatory Visit: Payer: Self-pay | Admitting: Family Medicine

## 2016-01-14 NOTE — Progress Notes (Signed)
PT evaluation addendum Late entry for 11/12/15 PT Visit diagnosis:    11/12/15 1048  PT Assessment  PT Recommendation/Assessment Patient needs continued PT services  PT Problem List Decreased strength;Decreased activity tolerance;Decreased balance;Decreased cognition;Decreased mobility;Decreased knowledge of use of DME;Decreased safety awareness (visit diagnoses:  unsteadiness on feet)  01/14/2016 [place], [place]

## 2016-04-28 IMAGING — CT CT GUIDANCE TISSUE ABLATION
1 of 11 series · 4 of 32 positions shown, 9 images · non-contrast
Comparison: none

CLINICAL DATA: 81-year-old with a solitary hepatocellular
carcinoma. Presents presents for image guided thermal ablation.

[Series 2: liver mwa · axial · 0.81mm/px · z∈[-150,-60]mm · 4 of 31 slices shown, 9 images]
[im 7/31  soft-tissue]
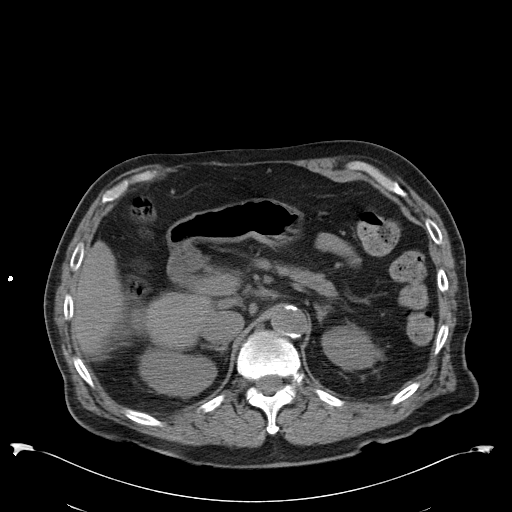
[im 7/31  lung]
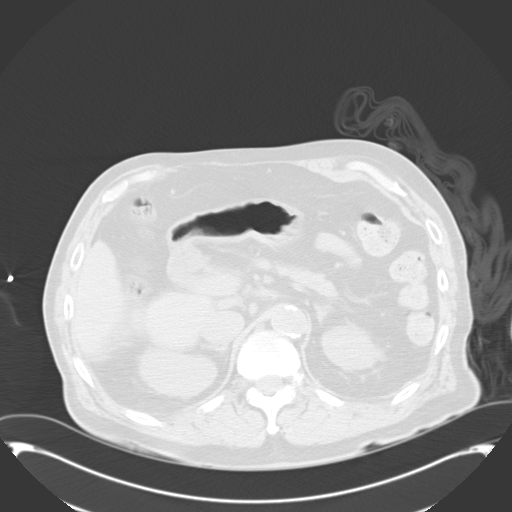
[im 7/31  bone]
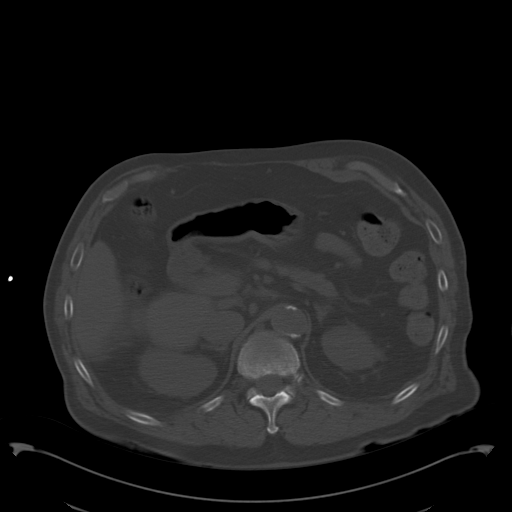
[im 13/31  soft-tissue]
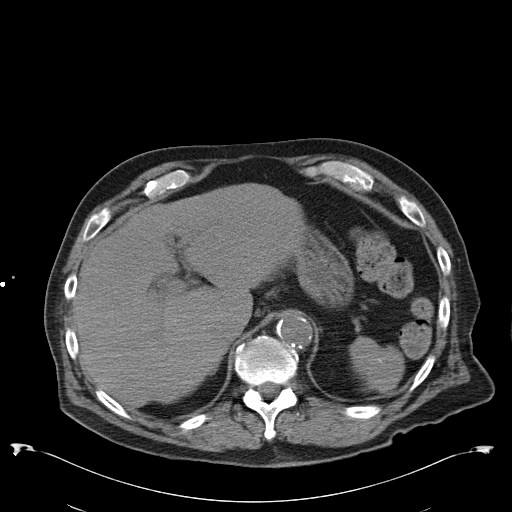
[im 13/31  lung]
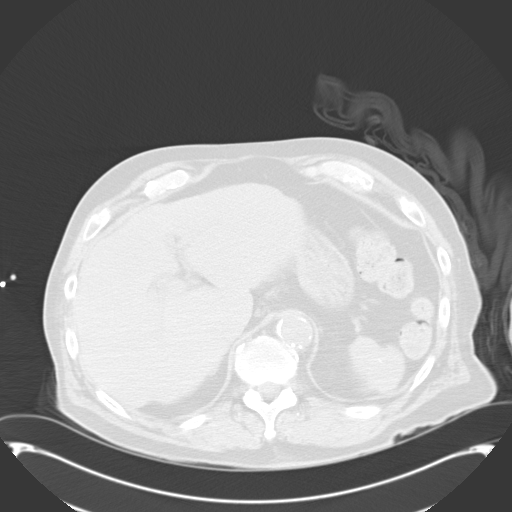
[im 19/31  soft-tissue]
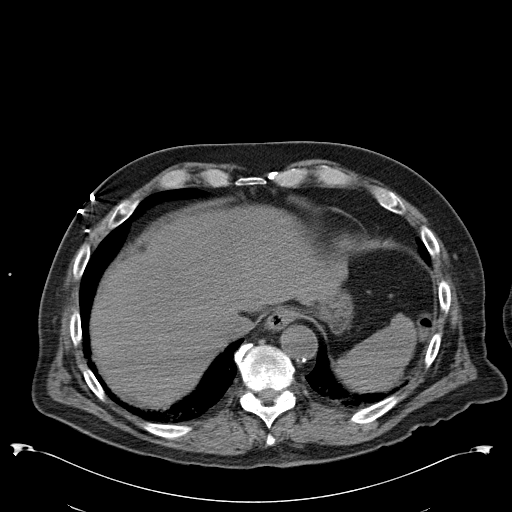
[im 19/31  lung]
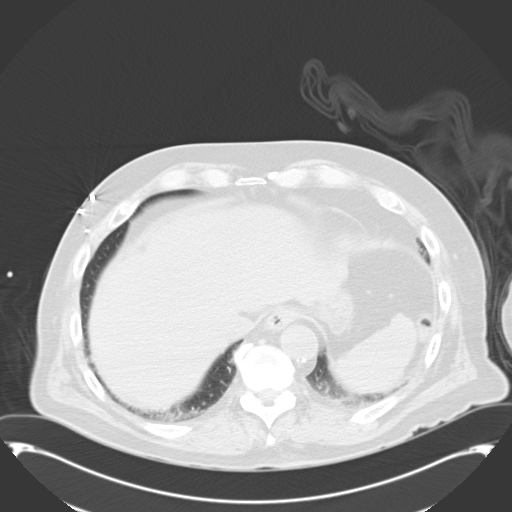
[im 25/31  soft-tissue]
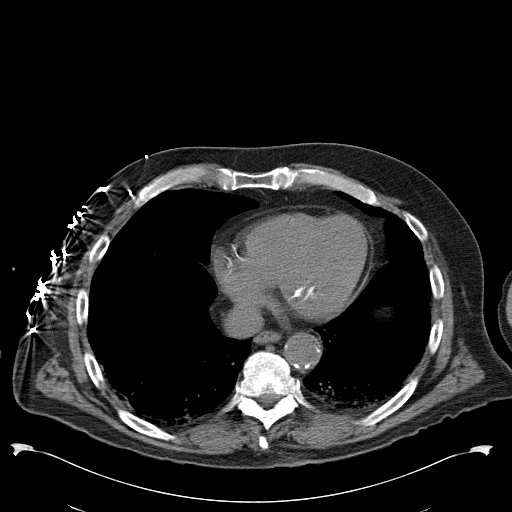
[im 25/31  lung]
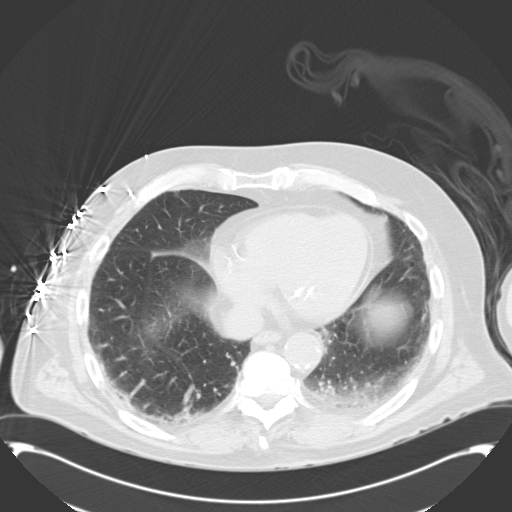

[4 of 32 positions shown; findings below may reference images not displayed]

EXAM:
THERMAL ABLATION OF LIVER LESION WITH CT AND ULTRASOUND GUIDANCE

FLUOROSCOPY TIME:  None

MEDICATIONS:
Flagyl andAztreonam were given prior to the procedure.

ANESTHESIA/SEDATION:
General anesthesia

CONTRAST:  150 mL Omnipaque 350

PROCEDURE:
The procedure was explained to the patient. The risks and benefits
of the procedure were discussed and the patient's questions were
addressed. Informed consent was obtained from the patient. Patient
was brought into the CT scanner. The patient was placed under
general anesthesia. CT images of the abdomen were obtained. The
liver was also evaluated with ultrasound. Hypoechoic lesion in the
central liver was identified with ultrasound. The anterior abdomen
was prepped and draped in sterile fashion. Maximal barrier sterile
technique was utilized including caps, mask, sterile gowns, sterile
gloves, sterile drape, hand hygiene and skin antiseptic. A 22 gauge
Chiba needle was directed into the lesion with ultrasound guidance.
Needle position was evaluated with CT. The 22 gauge needle was
exchanged for a 17 gauge Neuwave PR probe. The needle was directed
into the lesion with ultrasound guidance. Probe placement was
difficult because the probe was difficult to identify with
ultrasound and the lesion was not well demonstrated with CT. Post
contrast CT images were performed in order to better identify the
vascular structures. Ablation probe was positioned just beyond the
lesion with ultrasound guidance. The liver was ablated for 7 minutes
at 65 [address]. The ablation was watched in real-time with ultrasound
guidance. Echogenic gas was identified throughout the lesion. Follow
up CT images were obtained following administration of contrast. The
probe was removed using the cautery mode. Ablation probe was removed
without complication. Bandage placed over the puncture site.
FINDINGS: There is a hypoechoic lesion in the central liver. This lesion
corresponds with the biopsied lesion and lesion on recent MRI.
Lesion was not visible on the non contrast CT images. Probe
placement was performed with ultrasound guidance. The procedure was
technically difficult because the probe was difficult to see due the
liver echotexture. Probe position was confirmed within the lesion
and the needle was placed just beyond the lesion. Post contrast CT
images showed that the probe was not traversing a main portal
branch. The lesion and probe were adjacent to a small hepatic vein.
During the ablation, there was echogenic material/gas throughout the
lesion lesion. Follow up CT images demonstrated hypo attenuation at
the ablation site which also corresponds with the lesion. No
significant perihepatic bleeding.

Estimated blood loss: Minimal

COMPLICATIONS:
None
IMPRESSION: Successful microwave ablation of the central liver lesion using
ultrasound and CT guidance.

## 2016-04-29 IMAGING — CR DG CHEST 1V PORT
1 series · 1 of 1 positions shown · non-contrast
Comparison: Two-view chest x-ray 10/17/2014

CLINICAL DATA: Cough. Shortness breath. Personal history of lung
cancer.

EXAM:
PORTABLE CHEST - 1 VIEW

[AP]
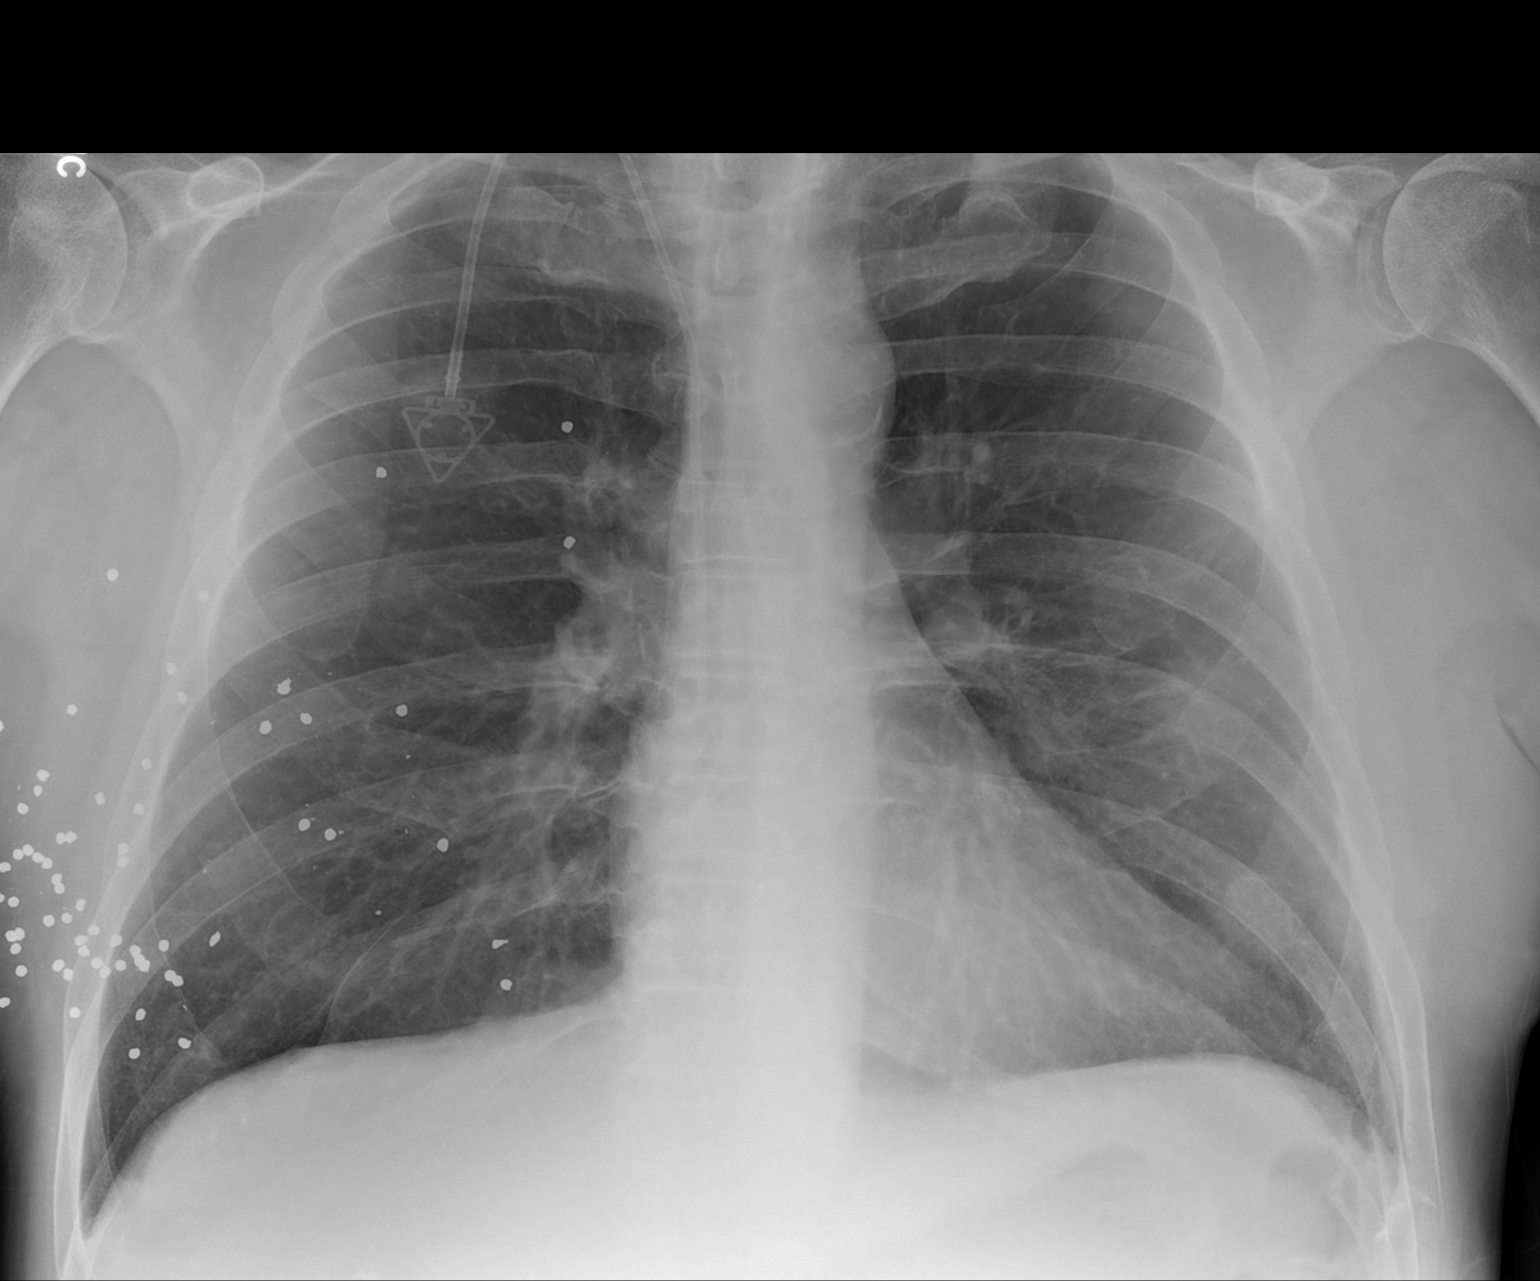

[1 of 1 positions shown; findings below may reference images not displayed]

FINDINGS: A right IJ Port-A-Cath is stable in position. [place]hot projects over
the right chest. The heart size is normal. The lungs are clear. The
visualized soft tissues and bony thorax are unremarkable.
Atherosclerotic changes are again noted.
IMPRESSION: 1. No acute cardiopulmonary disease or significant interval change.

## 2016-05-07 ENCOUNTER — Other Ambulatory Visit: Payer: Self-pay | Admitting: Nurse Practitioner
